# Patient Record
Sex: Male | Born: 1945 | Race: White | Hispanic: No | Marital: Married | Smoking: Current every day smoker
Health system: Southern US, Community
[De-identification: ages and names within clinical notes are randomized; demographics above are authoritative.]

## PROBLEM LIST (undated history)

## (undated) DIAGNOSIS — I5032 Chronic diastolic (congestive) heart failure: Secondary | ICD-10-CM

## (undated) DIAGNOSIS — J189 Pneumonia, unspecified organism: Secondary | ICD-10-CM

## (undated) DIAGNOSIS — K8020 Calculus of gallbladder without cholecystitis without obstruction: Secondary | ICD-10-CM

## (undated) DIAGNOSIS — I2510 Atherosclerotic heart disease of native coronary artery without angina pectoris: Secondary | ICD-10-CM

## (undated) DIAGNOSIS — G4700 Insomnia, unspecified: Secondary | ICD-10-CM

## (undated) DIAGNOSIS — F329 Major depressive disorder, single episode, unspecified: Secondary | ICD-10-CM

## (undated) DIAGNOSIS — K219 Gastro-esophageal reflux disease without esophagitis: Secondary | ICD-10-CM

## (undated) DIAGNOSIS — E7800 Pure hypercholesterolemia, unspecified: Secondary | ICD-10-CM

## (undated) DIAGNOSIS — R51 Headache: Secondary | ICD-10-CM

## (undated) DIAGNOSIS — E119 Type 2 diabetes mellitus without complications: Secondary | ICD-10-CM

## (undated) DIAGNOSIS — H409 Unspecified glaucoma: Secondary | ICD-10-CM

## (undated) DIAGNOSIS — J309 Allergic rhinitis, unspecified: Secondary | ICD-10-CM

## (undated) DIAGNOSIS — K222 Esophageal obstruction: Secondary | ICD-10-CM

## (undated) DIAGNOSIS — M479 Spondylosis, unspecified: Secondary | ICD-10-CM

## (undated) DIAGNOSIS — I219 Acute myocardial infarction, unspecified: Secondary | ICD-10-CM

## (undated) DIAGNOSIS — H269 Unspecified cataract: Secondary | ICD-10-CM

## (undated) DIAGNOSIS — A0472 Enterocolitis due to Clostridium difficile, not specified as recurrent: Secondary | ICD-10-CM

## (undated) DIAGNOSIS — R011 Cardiac murmur, unspecified: Secondary | ICD-10-CM

## (undated) DIAGNOSIS — I10 Essential (primary) hypertension: Secondary | ICD-10-CM

## (undated) DIAGNOSIS — R740 Nonspecific elevation of levels of transaminase and lactic acid dehydrogenase [LDH]: Secondary | ICD-10-CM

## (undated) DIAGNOSIS — K2980 Duodenitis without bleeding: Secondary | ICD-10-CM

## (undated) DIAGNOSIS — G709 Myoneural disorder, unspecified: Secondary | ICD-10-CM

## (undated) DIAGNOSIS — Z8719 Personal history of other diseases of the digestive system: Secondary | ICD-10-CM

## (undated) DIAGNOSIS — G43909 Migraine, unspecified, not intractable, without status migrainosus: Secondary | ICD-10-CM

## (undated) HISTORY — DX: Gastro-esophageal reflux disease without esophagitis: K21.9

## (undated) HISTORY — PX: ELBOW SURGERY: SHX618

## (undated) HISTORY — DX: Nonspecific elevation of levels of transaminase and lactic acid dehydrogenase (ldh): R74.0

## (undated) HISTORY — DX: Esophageal obstruction: K22.2

## (undated) HISTORY — DX: Allergic rhinitis, unspecified: J30.9

## (undated) HISTORY — PX: ESOPHAGOGASTRODUODENOSCOPY: SHX1529

## (undated) HISTORY — PX: EYE SURGERY: SHX253

## (undated) HISTORY — DX: Pure hypercholesterolemia, unspecified: E78.00

## (undated) HISTORY — DX: Major depressive disorder, single episode, unspecified: F32.9

## (undated) HISTORY — DX: Enterocolitis due to Clostridium difficile, not specified as recurrent: A04.72

## (undated) HISTORY — DX: Chronic diastolic (congestive) heart failure: I50.32

## (undated) HISTORY — PX: COLONOSCOPY: SHX174

## (undated) HISTORY — PX: OTHER SURGICAL HISTORY: SHX169

## (undated) HISTORY — DX: Type 2 diabetes mellitus without complications: E11.9

## (undated) HISTORY — DX: Unspecified glaucoma: H40.9

## (undated) HISTORY — DX: Atherosclerotic heart disease of native coronary artery without angina pectoris: I25.10

## (undated) HISTORY — DX: Spondylosis, unspecified: M47.9

## (undated) HISTORY — PX: CORONARY ANGIOPLASTY: SHX604

## (undated) HISTORY — DX: Insomnia, unspecified: G47.00

## (undated) HISTORY — DX: Myoneural disorder, unspecified: G70.9

## (undated) HISTORY — DX: Duodenitis without bleeding: K29.80

## (undated) HISTORY — DX: Unspecified cataract: H26.9

## (undated) HISTORY — DX: Essential (primary) hypertension: I10

---

## 1998-06-04 ENCOUNTER — Ambulatory Visit (HOSPITAL_COMMUNITY): Admission: RE | Admit: 1998-06-04 | Discharge: 1998-06-04 | Payer: Self-pay | Admitting: Urology

## 2002-05-04 ENCOUNTER — Encounter: Payer: Self-pay | Admitting: Emergency Medicine

## 2002-05-04 ENCOUNTER — Inpatient Hospital Stay (HOSPITAL_COMMUNITY): Admission: AC | Admit: 2002-05-04 | Discharge: 2002-05-09 | Payer: Self-pay

## 2002-05-05 ENCOUNTER — Encounter: Payer: Self-pay | Admitting: Surgery

## 2002-05-17 ENCOUNTER — Ambulatory Visit (HOSPITAL_COMMUNITY): Admission: RE | Admit: 2002-05-17 | Discharge: 2002-05-18 | Payer: Self-pay | Admitting: Orthopedic Surgery

## 2002-06-27 ENCOUNTER — Encounter: Admission: RE | Admit: 2002-06-27 | Discharge: 2002-06-27 | Payer: Self-pay | Admitting: Neurosurgery

## 2002-06-27 ENCOUNTER — Encounter: Payer: Self-pay | Admitting: Neurosurgery

## 2002-07-02 ENCOUNTER — Encounter: Payer: Self-pay | Admitting: Orthopedic Surgery

## 2002-07-02 ENCOUNTER — Inpatient Hospital Stay (HOSPITAL_COMMUNITY): Admission: AD | Admit: 2002-07-02 | Discharge: 2002-07-11 | Payer: Self-pay | Admitting: Orthopedic Surgery

## 2002-08-08 ENCOUNTER — Encounter: Admission: RE | Admit: 2002-08-08 | Discharge: 2002-08-08 | Payer: Self-pay | Admitting: Neurosurgery

## 2002-08-08 ENCOUNTER — Encounter: Payer: Self-pay | Admitting: Neurosurgery

## 2003-06-18 ENCOUNTER — Encounter: Admission: RE | Admit: 2003-06-18 | Discharge: 2003-06-18 | Payer: Self-pay | Admitting: Specialist

## 2003-08-23 DIAGNOSIS — I219 Acute myocardial infarction, unspecified: Secondary | ICD-10-CM

## 2003-08-23 HISTORY — PX: NECK SURGERY: SHX720

## 2003-08-23 HISTORY — DX: Acute myocardial infarction, unspecified: I21.9

## 2003-10-21 ENCOUNTER — Ambulatory Visit (HOSPITAL_COMMUNITY): Admission: RE | Admit: 2003-10-21 | Discharge: 2003-10-22 | Payer: Self-pay | Admitting: Specialist

## 2004-02-20 ENCOUNTER — Encounter (HOSPITAL_COMMUNITY): Admission: RE | Admit: 2004-02-20 | Discharge: 2004-05-20 | Payer: Self-pay | Admitting: Internal Medicine

## 2004-03-22 ENCOUNTER — Inpatient Hospital Stay (HOSPITAL_COMMUNITY): Admission: EM | Admit: 2004-03-22 | Discharge: 2004-03-24 | Payer: Self-pay | Admitting: Emergency Medicine

## 2004-03-23 ENCOUNTER — Encounter: Payer: Self-pay | Admitting: Cardiovascular Disease

## 2004-05-22 ENCOUNTER — Encounter (HOSPITAL_COMMUNITY): Admission: RE | Admit: 2004-05-22 | Discharge: 2004-08-20 | Payer: Self-pay | Admitting: Internal Medicine

## 2004-09-02 ENCOUNTER — Ambulatory Visit: Payer: Self-pay | Admitting: Internal Medicine

## 2004-10-18 ENCOUNTER — Ambulatory Visit: Payer: Self-pay | Admitting: *Deleted

## 2004-10-25 ENCOUNTER — Ambulatory Visit: Payer: Self-pay | Admitting: *Deleted

## 2004-10-29 ENCOUNTER — Ambulatory Visit: Payer: Self-pay | Admitting: Cardiology

## 2004-10-29 ENCOUNTER — Emergency Department (HOSPITAL_COMMUNITY): Admission: EM | Admit: 2004-10-29 | Discharge: 2004-10-29 | Payer: Self-pay | Admitting: Emergency Medicine

## 2004-11-01 ENCOUNTER — Ambulatory Visit: Payer: Self-pay | Admitting: *Deleted

## 2004-11-02 ENCOUNTER — Ambulatory Visit: Payer: Self-pay

## 2004-11-08 ENCOUNTER — Ambulatory Visit: Payer: Self-pay | Admitting: *Deleted

## 2004-11-11 ENCOUNTER — Ambulatory Visit: Payer: Self-pay | Admitting: Internal Medicine

## 2004-11-15 ENCOUNTER — Ambulatory Visit: Payer: Self-pay | Admitting: *Deleted

## 2004-11-22 ENCOUNTER — Ambulatory Visit: Payer: Self-pay | Admitting: *Deleted

## 2004-12-06 ENCOUNTER — Ambulatory Visit: Payer: Self-pay | Admitting: *Deleted

## 2005-02-17 ENCOUNTER — Ambulatory Visit: Payer: Self-pay | Admitting: Internal Medicine

## 2005-03-02 ENCOUNTER — Ambulatory Visit: Payer: Self-pay | Admitting: Internal Medicine

## 2005-03-28 ENCOUNTER — Ambulatory Visit: Payer: Self-pay | Admitting: Internal Medicine

## 2005-05-24 ENCOUNTER — Ambulatory Visit: Payer: Self-pay | Admitting: Gastroenterology

## 2005-06-06 ENCOUNTER — Ambulatory Visit: Payer: Self-pay | Admitting: Gastroenterology

## 2005-06-09 ENCOUNTER — Ambulatory Visit: Payer: Self-pay | Admitting: Gastroenterology

## 2005-06-09 ENCOUNTER — Encounter (INDEPENDENT_AMBULATORY_CARE_PROVIDER_SITE_OTHER): Payer: Self-pay | Admitting: Specialist

## 2005-06-09 LAB — HM COLONOSCOPY: HM Colonoscopy: NORMAL

## 2005-07-25 ENCOUNTER — Ambulatory Visit: Payer: Self-pay | Admitting: Gastroenterology

## 2005-08-10 ENCOUNTER — Ambulatory Visit: Payer: Self-pay | Admitting: Gastroenterology

## 2005-09-27 ENCOUNTER — Ambulatory Visit: Payer: Self-pay | Admitting: Gastroenterology

## 2005-09-30 ENCOUNTER — Ambulatory Visit: Payer: Self-pay | Admitting: Internal Medicine

## 2006-01-09 ENCOUNTER — Ambulatory Visit: Payer: Self-pay | Admitting: Internal Medicine

## 2006-03-01 ENCOUNTER — Ambulatory Visit: Payer: Self-pay | Admitting: Internal Medicine

## 2006-03-07 ENCOUNTER — Ambulatory Visit: Payer: Self-pay | Admitting: Internal Medicine

## 2006-04-27 ENCOUNTER — Ambulatory Visit: Payer: Self-pay | Admitting: Internal Medicine

## 2006-05-09 ENCOUNTER — Encounter: Payer: Self-pay | Admitting: Cardiology

## 2006-05-09 ENCOUNTER — Ambulatory Visit: Payer: Self-pay | Admitting: Internal Medicine

## 2006-05-09 ENCOUNTER — Ambulatory Visit: Payer: Self-pay

## 2006-05-22 ENCOUNTER — Ambulatory Visit: Payer: Self-pay | Admitting: Internal Medicine

## 2006-08-29 ENCOUNTER — Ambulatory Visit: Payer: Self-pay | Admitting: Internal Medicine

## 2006-10-31 ENCOUNTER — Ambulatory Visit: Payer: Self-pay | Admitting: Internal Medicine

## 2006-10-31 LAB — CONVERTED CEMR LAB
ALT: 49 units/L — ABNORMAL HIGH (ref 0–40)
AST: 52 units/L — ABNORMAL HIGH (ref 0–37)
Alkaline Phosphatase: 55 units/L (ref 39–117)
HDL: 36.1 mg/dL — ABNORMAL LOW (ref >39.0)
Total Bilirubin: 0.9 mg/dL (ref 0.3–1.2)
Total CHOL/HDL Ratio: 3.7
Total Protein: 6.4 g/dL (ref 6.0–8.3)

## 2007-02-26 ENCOUNTER — Ambulatory Visit: Payer: Self-pay | Admitting: Gastroenterology

## 2007-02-27 ENCOUNTER — Ambulatory Visit: Payer: Self-pay | Admitting: Internal Medicine

## 2007-03-20 ENCOUNTER — Ambulatory Visit: Payer: Self-pay | Admitting: Gastroenterology

## 2007-03-20 HISTORY — PX: ESOPHAGOGASTRODUODENOSCOPY: SHX1529

## 2007-04-05 ENCOUNTER — Ambulatory Visit: Payer: Self-pay | Admitting: Endocrinology

## 2007-05-02 ENCOUNTER — Encounter: Payer: Self-pay | Admitting: Endocrinology

## 2007-05-02 DIAGNOSIS — E119 Type 2 diabetes mellitus without complications: Secondary | ICD-10-CM

## 2007-05-02 DIAGNOSIS — I10 Essential (primary) hypertension: Secondary | ICD-10-CM

## 2007-05-02 DIAGNOSIS — I2510 Atherosclerotic heart disease of native coronary artery without angina pectoris: Secondary | ICD-10-CM | POA: Insufficient documentation

## 2007-05-02 DIAGNOSIS — J309 Allergic rhinitis, unspecified: Secondary | ICD-10-CM

## 2007-05-02 HISTORY — DX: Type 2 diabetes mellitus without complications: E11.9

## 2007-05-02 HISTORY — DX: Essential (primary) hypertension: I10

## 2007-05-02 HISTORY — DX: Allergic rhinitis, unspecified: J30.9

## 2007-05-03 ENCOUNTER — Ambulatory Visit: Payer: Self-pay | Admitting: Endocrinology

## 2007-06-19 ENCOUNTER — Telehealth (INDEPENDENT_AMBULATORY_CARE_PROVIDER_SITE_OTHER): Payer: Self-pay | Admitting: *Deleted

## 2007-11-15 ENCOUNTER — Ambulatory Visit: Payer: Self-pay | Admitting: Internal Medicine

## 2007-12-10 DIAGNOSIS — K222 Esophageal obstruction: Secondary | ICD-10-CM

## 2007-12-10 DIAGNOSIS — K219 Gastro-esophageal reflux disease without esophagitis: Secondary | ICD-10-CM

## 2007-12-10 DIAGNOSIS — R7401 Elevation of levels of liver transaminase levels: Secondary | ICD-10-CM | POA: Insufficient documentation

## 2007-12-10 DIAGNOSIS — K2980 Duodenitis without bleeding: Secondary | ICD-10-CM | POA: Insufficient documentation

## 2007-12-10 DIAGNOSIS — R740 Nonspecific elevation of levels of transaminase and lactic acid dehydrogenase [LDH]: Secondary | ICD-10-CM

## 2007-12-10 HISTORY — DX: Elevation of levels of liver transaminase levels: R74.01

## 2007-12-10 HISTORY — DX: Duodenitis without bleeding: K29.80

## 2007-12-10 HISTORY — DX: Esophageal obstruction: K22.2

## 2007-12-10 HISTORY — DX: Gastro-esophageal reflux disease without esophagitis: K21.9

## 2007-12-12 ENCOUNTER — Ambulatory Visit: Payer: Self-pay | Admitting: Gastroenterology

## 2007-12-12 LAB — CONVERTED CEMR LAB
Albumin: 4 g/dL (ref 3.5–5.2)
Basophils Absolute: 0 10*3/uL (ref 0.0–0.1)
Calcium: 9.3 mg/dL (ref 8.4–10.5)
Eosinophils Absolute: 0.1 10*3/uL (ref 0.0–0.7)
GFR calc Af Amer: 111 mL/min
GFR calc non Af Amer: 92 mL/min
Glucose, Bld: 116 mg/dL — ABNORMAL HIGH (ref 70–99)
HCT: 45.1 % (ref 39.0–52.0)
Hemoglobin: 15.2 g/dL (ref 13.0–17.0)
Monocytes Absolute: 0.5 10*3/uL (ref 0.1–1.0)
Neutrophils Relative %: 66.3 % (ref 43.0–77.0)
Platelets: 258 10*3/uL (ref 150–400)
Potassium: 4 meq/L (ref 3.5–5.1)
RBC: 4.39 M/uL (ref 4.22–5.81)
Total Protein: 7.1 g/dL (ref 6.0–8.3)

## 2007-12-18 ENCOUNTER — Encounter: Payer: Self-pay | Admitting: Gastroenterology

## 2007-12-18 ENCOUNTER — Telehealth: Payer: Self-pay | Admitting: Gastroenterology

## 2007-12-24 ENCOUNTER — Encounter: Payer: Self-pay | Admitting: Gastroenterology

## 2007-12-24 ENCOUNTER — Ambulatory Visit: Payer: Self-pay | Admitting: Gastroenterology

## 2007-12-26 ENCOUNTER — Encounter: Payer: Self-pay | Admitting: Gastroenterology

## 2008-03-25 ENCOUNTER — Telehealth: Payer: Self-pay | Admitting: Gastroenterology

## 2008-06-04 ENCOUNTER — Ambulatory Visit: Payer: Self-pay | Admitting: Cardiovascular Disease

## 2008-06-04 ENCOUNTER — Observation Stay (HOSPITAL_COMMUNITY): Admission: EM | Admit: 2008-06-04 | Discharge: 2008-06-06 | Payer: Self-pay | Admitting: *Deleted

## 2008-07-08 ENCOUNTER — Telehealth: Payer: Self-pay | Admitting: Endocrinology

## 2008-08-13 ENCOUNTER — Telehealth: Payer: Self-pay | Admitting: Endocrinology

## 2008-08-18 ENCOUNTER — Telehealth: Payer: Self-pay | Admitting: Internal Medicine

## 2008-08-28 ENCOUNTER — Ambulatory Visit: Payer: Self-pay | Admitting: Endocrinology

## 2008-08-28 DIAGNOSIS — R209 Unspecified disturbances of skin sensation: Secondary | ICD-10-CM | POA: Insufficient documentation

## 2008-08-28 LAB — CONVERTED CEMR LAB
BUN: 8 mg/dL (ref 6–23)
Calcium: 9.5 mg/dL (ref 8.4–10.5)
Chloride: 99 meq/L (ref 96–112)
Folate: 9.9 ng/mL
GFR calc Af Amer: 111 mL/min
GFR calc non Af Amer: 91 mL/min
Glucose, Bld: 126 mg/dL — ABNORMAL HIGH (ref 70–99)
Hgb A1c MFr Bld: 6.4 % — ABNORMAL HIGH (ref 4.6–6.0)
Sodium: 138 meq/L (ref 135–145)
Vitamin B-12: 452 pg/mL (ref 211–911)

## 2008-10-02 ENCOUNTER — Encounter: Payer: Self-pay | Admitting: Endocrinology

## 2009-08-07 ENCOUNTER — Encounter: Payer: Self-pay | Admitting: Internal Medicine

## 2009-08-22 HISTORY — PX: BILATERAL VATS ABLATION: SHX1224

## 2009-08-26 ENCOUNTER — Telehealth: Payer: Self-pay | Admitting: Endocrinology

## 2009-10-27 ENCOUNTER — Ambulatory Visit: Payer: Self-pay | Admitting: Endocrinology

## 2009-10-27 DIAGNOSIS — F329 Major depressive disorder, single episode, unspecified: Secondary | ICD-10-CM

## 2009-10-27 DIAGNOSIS — F17200 Nicotine dependence, unspecified, uncomplicated: Secondary | ICD-10-CM

## 2009-10-27 DIAGNOSIS — F3289 Other specified depressive episodes: Secondary | ICD-10-CM

## 2009-10-27 DIAGNOSIS — M479 Spondylosis, unspecified: Secondary | ICD-10-CM

## 2009-10-27 DIAGNOSIS — E7800 Pure hypercholesterolemia, unspecified: Secondary | ICD-10-CM

## 2009-10-27 DIAGNOSIS — R05 Cough: Secondary | ICD-10-CM

## 2009-10-27 HISTORY — DX: Pure hypercholesterolemia, unspecified: E78.00

## 2009-10-27 HISTORY — DX: Other specified depressive episodes: F32.89

## 2009-10-27 HISTORY — DX: Spondylosis, unspecified: M47.9

## 2009-10-27 HISTORY — DX: Major depressive disorder, single episode, unspecified: F32.9

## 2010-01-08 ENCOUNTER — Ambulatory Visit: Payer: Self-pay | Admitting: Internal Medicine

## 2010-02-23 ENCOUNTER — Telehealth: Payer: Self-pay | Admitting: Endocrinology

## 2010-03-17 ENCOUNTER — Telehealth: Payer: Self-pay | Admitting: Endocrinology

## 2010-03-28 ENCOUNTER — Inpatient Hospital Stay (HOSPITAL_COMMUNITY)
Admission: EM | Admit: 2010-03-28 | Discharge: 2010-04-06 | Payer: Self-pay | Source: Home / Self Care | Admitting: Emergency Medicine

## 2010-03-28 ENCOUNTER — Ambulatory Visit: Payer: Self-pay | Admitting: Cardiology

## 2010-03-29 ENCOUNTER — Encounter (INDEPENDENT_AMBULATORY_CARE_PROVIDER_SITE_OTHER): Payer: Self-pay | Admitting: Internal Medicine

## 2010-03-30 ENCOUNTER — Encounter: Payer: Self-pay | Admitting: Internal Medicine

## 2010-03-30 ENCOUNTER — Ambulatory Visit: Payer: Self-pay | Admitting: Cardiothoracic Surgery

## 2010-03-31 ENCOUNTER — Encounter (INDEPENDENT_AMBULATORY_CARE_PROVIDER_SITE_OTHER): Payer: Self-pay | Admitting: Internal Medicine

## 2010-04-13 ENCOUNTER — Ambulatory Visit: Payer: Self-pay | Admitting: Cardiothoracic Surgery

## 2010-04-27 ENCOUNTER — Ambulatory Visit: Payer: Self-pay | Admitting: Cardiothoracic Surgery

## 2010-04-27 ENCOUNTER — Ambulatory Visit: Payer: Self-pay | Admitting: Endocrinology

## 2010-04-27 ENCOUNTER — Encounter: Admission: RE | Admit: 2010-04-27 | Discharge: 2010-04-27 | Payer: Self-pay | Admitting: Cardiothoracic Surgery

## 2010-05-27 ENCOUNTER — Encounter: Admission: RE | Admit: 2010-05-27 | Discharge: 2010-05-27 | Payer: Self-pay | Admitting: Cardiothoracic Surgery

## 2010-05-27 ENCOUNTER — Encounter: Payer: Self-pay | Admitting: Internal Medicine

## 2010-05-27 ENCOUNTER — Ambulatory Visit: Payer: Self-pay | Admitting: Cardiothoracic Surgery

## 2010-05-30 ENCOUNTER — Encounter: Admission: RE | Admit: 2010-05-30 | Discharge: 2010-05-30 | Payer: Self-pay | Admitting: Anesthesiology

## 2010-06-03 ENCOUNTER — Ambulatory Visit: Payer: Self-pay | Admitting: Internal Medicine

## 2010-07-13 ENCOUNTER — Encounter: Admission: RE | Admit: 2010-07-13 | Payer: Self-pay | Admitting: Anesthesiology

## 2010-07-21 ENCOUNTER — Encounter: Admission: RE | Admit: 2010-07-21 | Payer: Self-pay | Source: Home / Self Care | Admitting: Specialist

## 2010-08-04 ENCOUNTER — Telehealth: Payer: Self-pay | Admitting: Internal Medicine

## 2010-09-08 ENCOUNTER — Encounter
Admission: RE | Admit: 2010-09-08 | Discharge: 2010-09-08 | Payer: Self-pay | Source: Home / Self Care | Attending: Anesthesiology | Admitting: Anesthesiology

## 2010-09-11 ENCOUNTER — Encounter: Payer: Self-pay | Admitting: Specialist

## 2010-09-12 ENCOUNTER — Encounter: Payer: Self-pay | Admitting: Gastroenterology

## 2010-09-19 LAB — CONVERTED CEMR LAB
ALT: 41 units/L (ref 0–53)
AST: 19 units/L (ref 0–37)
AST: 50 units/L — ABNORMAL HIGH (ref 0–37)
Albumin: 3.7 g/dL (ref 3.5–5.2)
Albumin: 4 g/dL (ref 3.5–5.2)
Alkaline Phosphatase: 66 units/L (ref 39–117)
Basophils Absolute: 0 10*3/uL (ref 0.0–0.1)
Basophils Absolute: 0.1 10*3/uL (ref 0.0–0.1)
Basophils Relative: 0.5 % (ref 0.0–3.0)
Basophils Relative: 1.1 % (ref 0.0–3.0)
Calcium: 8.8 mg/dL (ref 8.4–10.5)
Calcium: 9.1 mg/dL (ref 8.4–10.5)
Creatinine,U: 36.7 mg/dL
Eosinophils Absolute: 0.1 10*3/uL (ref 0.0–0.7)
Eosinophils Absolute: 0.1 10*3/uL (ref 0.0–0.7)
Eosinophils Relative: 1.1 % (ref 0.0–5.0)
Eosinophils Relative: 1.9 % (ref 0.0–5.0)
GFR calc non Af Amer: 104.24 mL/min (ref >60)
GFR calc non Af Amer: 142.26 mL/min (ref >60)
Glucose, Bld: 102 mg/dL — ABNORMAL HIGH (ref 70–99)
HCT: 41.1 % (ref 39.0–52.0)
HDL: 53.3 mg/dL (ref >39.00)
Hemoglobin: 14 g/dL (ref 13.0–17.0)
Lymphocytes Relative: 20.2 % (ref 12.0–46.0)
Lymphs Abs: 1.4 10*3/uL (ref 0.7–4.0)
MCHC: 34.1 g/dL (ref 30.0–36.0)
MCV: 102.8 fL — ABNORMAL HIGH (ref 78.0–100.0)
Microalb, Ur: 0.9 mg/dL (ref 0.0–1.9)
Monocytes Relative: 5.8 % (ref 3.0–12.0)
Monocytes Relative: 5.8 % (ref 3.0–12.0)
Neutro Abs: 5.1 10*3/uL (ref 1.4–7.7)
Neutrophils Relative %: 71 % (ref 43.0–77.0)
Nitrite: NEGATIVE
Platelets: 248 10*3/uL (ref 150.0–400.0)
Potassium: 3.9 meq/L (ref 3.5–5.1)
Potassium: 4 meq/L (ref 3.5–5.1)
RDW: 12.1 % (ref 11.5–14.6)
Specific Gravity, Urine: 1.01 (ref 1.000–1.030)
TSH: 1.9 microintl units/mL (ref 0.35–5.50)
Total Bilirubin: 0.4 mg/dL (ref 0.3–1.2)
Total Bilirubin: 0.4 mg/dL (ref 0.3–1.2)
Total Protein: 6.5 g/dL (ref 6.0–8.3)
Total Protein: 7.5 g/dL (ref 6.0–8.3)
WBC: 6.4 10*3/uL (ref 4.5–10.5)
WBC: 7.1 10*3/uL (ref 4.5–10.5)
pH: 6.5 (ref 5.0–8.0)

## 2010-09-21 NOTE — Assessment & Plan Note (Signed)
Summary: YEARLY FU/ NWS  #   Vital Signs:  Patient profile:   65 year old male Height:      69 inches Weight:      178 pounds BMI:     26.38 O2 Sat:      95 % on Room air Temp:     94.4 degrees F oral Pulse rate:   80 / minute BP sitting:   112 / 68  (left arm) Cuff size:   regular  Vitals Entered By: Josph Macho RMA (October 27, 2009 2:22 PM)  O2 Flow:  Room air CC: pt here for cpx/pt states he stopped taking zyrtec, celebrex, pantoprazole Is Patient Diabetic? Yes   CC:  pt here for cpx/pt states he stopped taking zyrtec, celebrex, and pantoprazole.  History of Present Illness: here for regular wellness examination.  He's feeling pretty well in general, and does not drink etoh.   Current Medications (verified): 1)  Oxycontin 20 Mg  Tb12 (Oxycodone Hcl) .... Take As Diected 2)  Oxycodone-Acetaminophen 5-325 Mg  Tabs (Oxycodone-Acetaminophen) .... Take 1 By Mouth Qd 3)  Robaxin-750 750 Mg  Tabs (Methocarbamol) .... Take 1 By Mouth Once Daily Prn 4)  Mysoline 50 Mg  Tabs (Primidone) .... Take 1 By Mouth Qhs 5)  Adult Aspirin Low Strength 81 Mg  Tbdp (Aspirin) .... Take 1 By Mouth Qd 6)  Plavix 75 Mg  Tabs (Clopidogrel Bisulfate) .... Take 1 By Mouth Qd 7)  Lisinopril 20 Mg  Tabs (Lisinopril) .... Take 1 By Mouth Qd 8)  Toprol Xl 50 Mg  Tb24 (Metoprolol Succinate) .... Take 1/2 Tab By Mouth Qd 9)  Maxzide-25 37.5-25 Mg  Tabs (Triamterene-Hctz) .... Take 1 By Mouth Qd 10)  Zyrtec Allergy 10 Mg  Tabs (Cetirizine Hcl) .... Take 1 By Mouth Qd 11)  Xalatan 0.005 %  Soln (Latanoprost) .... Use 1 Drop Two Times A Day in Both Eyes Qd 12)  Optivar 0.05 %  Soln (Azelastine Hcl) .... Use 1 Drop Two Times A Day in Both Eyes Once Daily 13)  Pantoprazole Sodium 40 Mg  Tbec (Pantoprazole Sodium) .Marland Kitchen.. 1 Twice A Day 30 Minutes Before Meals 14)  Cymbalta 60 Mg  Cpep (Duloxetine Hcl) .... Take 1 By Mouth Two Times A Day Qd 15)  Lunesta 3 Mg  Tabs (Eszopiclone) .... Take 1 By Mouth Qhs 16)   Zocor 40 Mg  Tabs (Simvastatin) .... Take 1 By Mouth Qd 17)  Celebrex 200 Mg  Caps (Celecoxib) .... Take 1 By Mouth Qd 18)  Klor-Con M20 20 Meq  Tbcr (Potassium Chloride Crys Cr) .... Take 1 By Mouth Qd 19)  Nitrostat 0.4 Mg  Subl (Nitroglycerin) .... Use Prn 20)  Triamterene-Hctz 37.5-25 Mg Tabs (Triamterene-Hctz) .... Take 1 Tablet By Mouth Once A Day 21)  Metformin Hcl 500 Mg Xr24h-Tab (Metformin Hcl) .... 2 Qam  Allergies (verified): 1)  ! Verapamil 2)  ! Wellbutrin 3)  ! Tetracycline 4)  Doxycycline Hyclate (Doxycycline Hyclate) 5)  Verelan Pm (Verapamil Hcl)  Family History: Reviewed history and no changes required. mother had uncertain type of cancer (deceased)  Social History: Reviewed history and no changes required. disabled married  Review of Systems  The patient denies weight loss, weight gain, vision loss, decreased hearing, syncope, headaches, abdominal pain, melena, hematochezia, severe indigestion/heartburn, hematuria, and suspicious skin lesions.         he seldom has angina.  he has slight depression.  Physical Exam  General:  normal appearance.  Head:  head: no deformity eyes: no periorbital swelling, no proptosis external nose and ears are normal mouth: no lesion seen Neck:  Supple without thyroid enlargement or tenderness.  Heart:  Regular rate and rhythm without murmurs or gallops noted. Normal S1,S2.   Abdomen:  abdomen is soft, nontender.  no plenomegaly.   not distended.  there is a self-reducing  midline hernia  Rectal:  normal external and internal exam.  heme neg  Prostate:  Normal size prostate without masses or tenderness.  Msk:  muscle bulk and strength are grossly normal.  no obvious joint swelling.  gait is normal and steady there is a healed midline surgical scar at the posterior neck. 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  there is chronic red discoloration on the legs, but no tenderness or warmth.  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 Neurologic:  cn 2-12 grossly intact.   readily moves all 4's.   sensation is intact to touch on the feet, but severely decreased from normal. Skin:  there is slight skin breakdown at the buttocks Cervical Nodes:  No significant adenopathy.  Psych:  Alert and cooperative; normal mood and affect; normal attention span and concentration.   Additional Exam:  SEPARATE EVALUATION FOLLOWS--EACH PROBLEM HERE IS NEW, NOT RESPONDING TO TREATMENT, OR POSES SIGNIFICANT RISK TO THE PATIENT'S HEALTH: HISTORY OF THE PRESENT ILLNESS: dyslipidemia is noted on labs.  he tolerates zocor well. pt states slight dry cough. pt thinks he burned himself on the back, x 1 year.  he states moderate rash there. no associated itching PAST MEDICAL HISTORY reviewed and up to date today REVIEW OF SYSTEMS: denies sob and fever PHYSICAL EXAMINATION: clear to auscultation, except for rales at the left base.  no respiratory distress dorsalis pedis intact bilat, but decreased from normal.  no carotid bruit there is a moderate partial-thickness burn on the lower back. LAB/XRAY RESULTS: ldl=119 IMPRESSION: partial-thickness burn dyslipidemia, needs increased rx cough, prob due to recent uri PLAN: see instruction sheet   Impression & Recommendations:  Problem # 1:  ROUTINE GENERAL MEDICAL EXAM@HEALTH  CARE FACL (ICD-V70.0)  Medications Added to Medication List This Visit: 1)  Oxycontin 60 Mg Tb12 (oxycodone Hcl)  .... Take as diected 2)  Oxycodone-acetaminophen10-325 Mg Tabs  .... Take 1 by mouth once daily dr Vear Clock  Other Orders: EKG w/ Interpretation (93000) Pneumococcal Vaccine (28413) Admin 1st Vaccine (24401) Flu Vaccine 50yrs + (02725) T-2 View CXR (71020TC) TLB-Lipid Panel (80061-LIPID) TLB-BMP (Basic Metabolic Panel-BMET) (80048-METABOL) TLB-CBC Platelet - w/Differential (85025-CBCD) TLB-Hepatic/Liver Function Pnl (80076-HEPATIC) TLB-TSH (Thyroid Stimulating Hormone)  (84443-TSH) TLB-A1C / Hgb A1C (Glycohemoglobin) (83036-A1C) TLB-Microalbumin/Creat Ratio, Urine (82043-MALB) TLB-Udip w/ Micro (81001-URINE) TLB-PSA (Prostate Specific Antigen) (84153-PSA) Prescription Created Electronically (D6644) Est. Patient Level IV (03474) Est. Patient 40-64 years (25956)  Preventive Care Screening  Colonoscopy:    Date:  06/09/2005    Results:  normal    Patient Instructions: 1)  chest x ray and blood tests today. 2)  tests are being ordered for you today.  a few days after the test(s), please call [phone] to hear your test results. 3)  return 6 months 4)  (update: i left message on phone-tree:  if you are not taking zocor, please start.  if so, call us so we can change to more effective med). Prescriptions: METFORMIN HCL 500 MG XR24H-TAB (METFORMIN HCL) 2 qam  #60 x 11   Entered and Authorized by:   Minus Breeding MD   Signed by:   Minus Breeding MD  on 10/27/2009   Method used:   [id number]       [address]       [place], [place]  [address]       Ph: [phone] or [phone]       Fax: [phone]   RxID:   [id number]    Immunizations Administered:  Pneumonia Vaccine:    Vaccine Type: Pneumovax    Site: right deltoid    Mfr: Merck    Dose: 0.5 ml    Route: IM    Given by: Josph Macho RMA    Exp. Date: 04/05/2011    Lot #: [id number]    VIS given: 03/19/96 version given October 27, 2009. Flu Vaccine Consent Questions     Do you have a history of severe allergic reactions to this vaccine? no    Any prior history of allergic reactions to egg and/or gelatin? no    Do you have a sensitivity to the preservative Thimersol? no    Do you have a past history of Guillan-Barre Syndrome? no    Do you currently have an acute febrile illness? no    Have you ever had a severe reaction to latex? no    Vaccine information given and explained to patient? yes    Are you currently  pregnant? no    Lot Number:[id number]   Exp Date:02/18/2010   Site Given  Left Deltoid IM    Influenza Vaccine # 1 not given due to: declined PT ACCEPTED FLU VACCINE/cf .lbflu   Preventive Care Screening  Colonoscopy:    Date:  06/09/2005    Results:  normal

## 2010-09-21 NOTE — Progress Notes (Signed)
Summary: postassium  Phone Note Refill Request Message from:  Fax from Pharmacy  Refills Requested: Medication #1:  KLOR-CON M20 20 MEQ  TBCR take 1 by mouth qd   Dosage confirmed as above?Dosage Confirmed  Method Requested: Fax to Local Pharmacy Initial call taken by: Brenton Grills MA,  March 17, 2010 3:13 PM    Prescriptions: KLOR-CON M20 20 MEQ  TBCR (POTASSIUM CHLORIDE CRYS CR) take 1 by mouth qd  #1 month x 2   Entered by:   Brenton Grills MA   Authorized by:   Minus Breeding MD   Signed by:   Brenton Grills MA on 03/17/2010   Method used:   Faxed to ...       [facility]* (retail)       [address] [place], [place]  [address]       Ph: [phone] or [phone]       Fax: [phone]   RxID:   [id number]

## 2010-09-21 NOTE — Progress Notes (Signed)
Summary: Rx refill req  Phone Note Refill Request Message from:  Fax from Pharmacy on February 23, 2010 8:13 AM  Refills Requested: Medication #1:  METFORMIN HCL 500 MG XR24H-TAB 2 qam   Dosage confirmed as above?Dosage Confirmed   Supply Requested: 3 months  Method Requested: Electronic Initial call taken by: Margaret Pyle, CMA,  February 23, 2010 8:14 AM    Prescriptions: METFORMIN HCL 500 MG XR24H-TAB (METFORMIN HCL) 2 qam  #180 x 2   Entered by:   Margaret Pyle, CMA   Authorized by:   Minus Breeding MD   Signed by:   Margaret Pyle, CMA on 02/23/2010   Method used:   [facility]        [id number]       [address]       [place], [place]  [address]       Ph: [phone] or [phone]       Fax: [phone]   RxID:   [id number]

## 2010-09-21 NOTE — Progress Notes (Signed)
  Phone Note Refill Request Message from:  Fax from Pharmacy on August 26, 2009 12:01 PM  Refills Requested: Medication #1:  KLOR-CON M20 20 MEQ  TBCR take 1 by mouth qd   Dosage confirmed as above?Dosage Confirmed Initial call taken by: Josph Macho CMA,  August 26, 2009 12:02 PM    Prescriptions: KLOR-CON M20 20 MEQ  TBCR (POTASSIUM CHLORIDE CRYS CR) take 1 by mouth qd  #1 month x 1   Entered by:   Josph Macho CMA   Authorized by:   Minus Breeding MD   Signed by:   Josph Macho CMA on 08/26/2009   Method used:   Electronically to        [facility]* (retail)       [address] [place], [place]  [address]       Ph: [phone] or [phone]       Fax: [phone]   RxID:   [id number]

## 2010-09-21 NOTE — Assessment & Plan Note (Signed)
Summary: f1y per pt call/lg  Medications Added MYSOLINE 50 MG  TABS (PRIMIDONE) take 3 by mouth qhs ZITHROMAX 1 GM PACK (AZITHROMYCIN) take as directed ZOCOR 80 MG TABS (SIMVASTATIN) 1 tab every day      Allergies Added:   Visit Type:  Follow-up Primary Provider:  Dr Marylin Crosby  CC:  since last visit pt has had 3 eposides of angina.  History of Present Illness:  IDENTIFICATION:  [name] is a 65 year old gentleman with a history of CAD (status post PTCA/stent to the circumflex and LAD in 2005, drug- eluting).  I last saw him back in July of last year.  Since then he has had rare episodes of chest pain.  The last one was 3 wks ago, relieved with 1 SL NTG.  Breathing is ok.  Notes some sinus issues recently, thinks he has an infection.  Sputum is yellow/green.  Current Medications (verified): 1)  Oxycontin 60 Mg  Tb12 (Oxycodone Hcl) .... Take As Diected 2)  Oxycodone-Acetaminophen10-325 Mg  Tabs .... Take 1 By Mouth Once Daily Dr Vear Clock 3)  Robaxin-750 750 Mg  Tabs (Methocarbamol) .... Take 1 By Mouth Once Daily Prn 4)  Mysoline 50 Mg  Tabs (Primidone) .... Take 3 By Mouth Qhs 5)  Adult Aspirin Low Strength 81 Mg  Tbdp (Aspirin) .... Take 1 By Mouth Qd 6)  Plavix 75 Mg  Tabs (Clopidogrel Bisulfate) .... Take 1 By Mouth Qd 7)  Lisinopril 20 Mg  Tabs (Lisinopril) .... Take 1 By Mouth Qd 8)  Toprol Xl 50 Mg  Tb24 (Metoprolol Succinate) .... Take 1/2 Tab By Mouth Qd 9)  Maxzide-25 37.5-25 Mg  Tabs (Triamterene-Hctz) .... Take 1 By Mouth Qd 10)  Xalatan 0.005 %  Soln (Latanoprost) .... Use 1 Drop Two Times A Day in Both Eyes Qd 11)  Optivar 0.05 %  Soln (Azelastine Hcl) .... Use 1 Drop Two Times A Day in Both Eyes Once Daily 12)  Cymbalta 60 Mg  Cpep (Duloxetine Hcl) .... Take 1 By Mouth Two Times A Day Qd 13)  Lunesta 3 Mg  Tabs (Eszopiclone) .... Take 1 By Mouth Qhs 14)  Zocor 40 Mg  Tabs (Simvastatin) .... Take 1 By Mouth Qd 15)  Klor-Con M20 20 Meq  Tbcr (Potassium  Chloride Crys Cr) .... Take 1 By Mouth Qd 16)  Nitrostat 0.4 Mg  Subl (Nitroglycerin) .... Use Prn 17)  Triamterene-Hctz 37.5-25 Mg Tabs (Triamterene-Hctz) .... Take 1 Tablet By Mouth Once A Day 18)  Metformin Hcl 500 Mg Xr24h-Tab (Metformin Hcl) .... 2 Qam  Allergies (verified): 1)  ! Verapamil 2)  ! Wellbutrin 3)  ! Tetracycline 4)  Doxycycline Hyclate (Doxycycline Hyclate) 5)  Verelan Pm (Verapamil Hcl)  Past History:  Past medical, surgical, family and social histories (including risk factors) reviewed, and no changes noted (except as noted below).  Past Medical History: Reviewed history from 08/28/2008 and no changes required. Insomnia Dyslipidemia DUODENITIS (ICD-535.60) ESOPHAGEAL STRICTURE (ICD-530.3) TRANSAMINASES, SERUM, ELEVATED (ICD-790.4) GERD (ICD-530.81) DIABETES MELLITUS, TYPE II (ICD-250.00) HYPERTENSION (ICD-401.9) CORONARY ARTERY DISEASE (ICD-414.00) ALLERGIC RHINITIS (ICD-477.9)  Past Surgical History: Reviewed history from 05/02/2007 and no changes required. EGD (03/20/2007)  Family History: Reviewed history from 10/27/2009 and no changes required. mother had uncertain type of cancer (deceased)  Social History: Reviewed history from 10/27/2009 and no changes required. disabled married  Review of Systems       ALl systems reviewed.  Negative to the above problem  Vital Signs:  Patient profile:  65 year old male Height:      69 inches Weight:      169 pounds BMI:     25.05 Pulse rate:   69 / minute BP sitting:   148 / 83  (left arm) Cuff size:   regular  Vitals Entered By: Timothy Kanaris, CNA (Jan 08, 2010 2:07 PM)  Physical Exam  Additional Exam:  Patient is in NAD. HEENT:  Normocephalic, atraumatic. EOMI, PERRLA.  Neck: JVP is normal. No thyromegaly. No bruits.  Lungs: clear to auscultation. No rales no wheezes.  Heart: Regular rate and rhythm. Normal S1, S2. No S3.   No significant murmurs. PMI not displaced.  Abdomen:  Supple,  nontender. Normal bowel sounds. No masses. No hepatomegaly.  Extremities:   Good distal pulses throughout. No lower extremity edema.  Musculoskeletal :moving all extremities.  Neuro:   alert and oriented x3.    EKG  Procedure date:  01/08/2010  Findings:      NSR.  63 bpm.  RBBB.  Impression & Recommendations:  Problem # 1:  CORONARY ARTERY DISEASE (ICD-414.00) Doing well.  Keep on same regimen.  Problem # 2:  HYPERCHOLESTEROLEMIA (ICD-272.0) I have asked him to increase his SZocor to 80 as LDL wsa 119 in March.  F/u lipid panel in 8 wks. The following medications were removed from the medication list:    Zocor 40 Mg Tabs (Simvastatin) .Marland Kitchen... Take 1 by mouth qd His updated medication list for this problem includes:    Zocor 80 Mg Tabs (Simvastatin) .Marland Kitchen... 1 tab every day  Problem # 3:  HYPERTENSION (ICD-401.9) Will need to be followed.  Problem # 4:  COUGH (ICD-786.2) Does not complain of a cough.  COmplains more of sinus drainage.  I have prescribed a Z pac.  Other Orders: EKG w/ Interpretation (93000)  Patient Instructions: 1)  Your physician recommends that you return for a FASTING lipid profile: lipid and ast in 8 weeks  2)  Your physician wants you to follow-up in: Feb 2012  You will receive a reminder letter in the mail two months in advance. If you don't receive a letter, please call our office to schedule the follow-up appointment. Prescriptions: ZOCOR 80 MG TABS (SIMVASTATIN) 1 tab every day  #30 x 11   Entered by:   Layne Benton, RN, BSN   Authorized by:   Sherrill Raring, MD, [facility]   Signed by:   Layne Benton, RN, BSN on 01/08/2010   Method used:   [id number]       [address]       [place], [place]  [address]       Ph: [phone] or [phone]       Fax: [phone]   RxID:   [id number] ZITHROMAX 1 GM PACK (AZITHROMYCIN) take as directed  #1 pack x 0   Entered by:    Layne Benton, RN, BSN   Authorized by:   Sherrill Raring, MD, [facility]   Signed by:   Layne Benton, RN, BSN on 01/08/2010   Method used:   [facility] [id number]       [address]       [place], [place]  [address]       Ph: [phone] or [phone]  Fax: [phone]   RxID:   [id number]

## 2010-09-21 NOTE — Letter (Signed)
Summary: [facility] Office Visit   [facility] Office Visit   Imported By: Roderic Ovens 06/08/2010 11:17:20  _____________________________________________________________________  External Attachment:    Type:   Image     Comment:   External Document

## 2010-09-21 NOTE — Assessment & Plan Note (Signed)
Summary: eph/per spouse call/lg  Medications Added * OXYCONTIN 60 MG  TB12 (OXYCODONE HCL) every 4 hours as needed      Allergies Added:   Visit Type:  Follow-up Primary Provider:  Dr Marylin Crosby   History of Present Illness: IDENTIFICATION:  [name] is a 65 year old gentleman with a history of CAD (status post PTCA/stent to the circumflex and LAD in 2005, drug- eluting).  I last saw him back in May. when I saw him he had some sinus congestion.  He  took by mouth abx.  It never cleared and got worse. He was admitted in August with an empyema.  During surg drainage he had a short burst of SVT.  None after. Since d/c he has been recuperating.  He said his breathing is near baseline.  He denies chest pain  Current Medications (verified): 1)  Oxycontin 60 Mg  Tb12 (Oxycodone Hcl) .... Every 4 Hours As Needed 2)  Oxycodone-Acetaminophen10-325 Mg  Tabs .... Take 1 By Mouth Once Daily Dr Vear Clock 3)  Robaxin-750 750 Mg  Tabs (Methocarbamol) .... Take 1 By Mouth Once Daily Prn 4)  Mysoline 50 Mg  Tabs (Primidone) .... Take 3 By Mouth Qhs 5)  Adult Aspirin Low Strength 81 Mg  Tbdp (Aspirin) .... Take 1 By Mouth Qd 6)  Plavix 75 Mg  Tabs (Clopidogrel Bisulfate) .... Take 1 By Mouth Qd 7)  Lisinopril 20 Mg  Tabs (Lisinopril) .... Take 1 By Mouth Qd 8)  Toprol Xl 50 Mg  Tb24 (Metoprolol Succinate) .... Take 1/2 Tab By Mouth Qd 9)  Maxzide-25 37.5-25 Mg  Tabs (Triamterene-Hctz) .... Take 1 By Mouth Qd 10)  Xalatan 0.005 %  Soln (Latanoprost) .... Use 1 Drop Two Times A Day in Both Eyes Qd 11)  Optivar 0.05 %  Soln (Azelastine Hcl) .... Use 1 Drop Two Times A Day in Both Eyes Once Daily 12)  Cymbalta 60 Mg  Cpep (Duloxetine Hcl) .... Take 1 By Mouth Two Times A Day Qd 13)  Lunesta 3 Mg  Tabs (Eszopiclone) .... Take 1 By Mouth Qhs 14)  Klor-Con M20 20 Meq  Tbcr (Potassium Chloride Crys Cr) .... Take 1 By Mouth Qd 15)  Nitrostat 0.4 Mg  Subl (Nitroglycerin) .... Use Prn 16)   Triamterene-Hctz 37.5-25 Mg Tabs (Triamterene-Hctz) .... Take 1 Tablet By Mouth Once A Day 17)  Metformin Hcl 500 Mg Xr24h-Tab (Metformin Hcl) .... 2 Qam 18)  Zocor 80 Mg Tabs (Simvastatin) .Marland Kitchen.. 1 Tab Every Day  Allergies (verified): 1)  ! Verapamil 2)  ! Wellbutrin 3)  ! Tetracycline 4)  Doxycycline Hyclate (Doxycycline Hyclate) 5)  Verelan Pm (Verapamil Hcl)  Past History:  Past medical, surgical, family and social histories (including risk factors) reviewed, and no changes noted (except as noted below).  Past Medical History: Insomnia Dyslipidemia DUODENITIS (ICD-535.60) ESOPHAGEAL STRICTURE (ICD-530.3) TRANSAMINASES, SERUM, ELEVATED (ICD-790.4) GERD (ICD-530.81) DIABETES MELLITUS, TYPE II (ICD-250.00) HYPERTENSION (ICD-401.9) CORONARY ARTERY DISEASE (ICD-414.00) ALLERGIC RHINITIS (ICD-477.9) Empyema  Past Surgical History: EGD (03/20/2007) VATS procedure 2011  Family History: Reviewed history from 10/27/2009 and no changes required. mother had uncertain type of cancer (deceased)  Social History: Reviewed history from 10/27/2009 and no changes required. disabled married  Vital Signs:  Patient profile:   65 year old male Height:      69 inches Weight:      162 pounds BMI:     24.01 Pulse rate:   74 / minute BP sitting:   150 / 80  (left  arm) Cuff size:   regular  Vitals Entered By: Burnett Kanaris, CNA (June 03, 2010 2:40 PM)  Physical Exam  Additional Exam:  patient is in NAD HEENT:  Normocephalic, atraumatic. EOMI, PERRLA.  Neck: JVP is normal. No thyromegaly. No bruits.  Lungs: clear to auscultation. No rales no wheezes.  Heart: Regular rate and rhythm. Normal S1, S2. No S3.   No significant murmurs. PMI not displaced.  Abdomen:  Supple, nontender. Normal bowel sounds. No masses. MIld hepatomegaly.  Extremities:   Good distal pulses throughout. No lower extremity edema.  Musculoskeletal :moving all extremities.  Neuro:   alert and oriented  x3.    EKG  Procedure date:  06/03/2010  Findings:      NSR.  69 bpm.  RBBB  Impression & Recommendations:  Problem # 1:  CORONARY ARTERY DISEASE (ICD-414.00) Symptoms appear stable.  Continue meds.  Problem # 2:  HYPERCHOLESTEROLEMIA (ICD-272.0) Keep on same regimen.  Will need t review numbers.  Has been on current regimen for awhile. His updated medication list for this problem includes:    Zocor 80 Mg Tabs (Simvastatin) .Marland Kitchen... 1 tab every day  Other Orders: EKG w/ Interpretation (93000) TLB-BMP (Basic Metabolic Panel-BMET) (80048-METABOL) TLB-CBC Platelet - w/Differential (85025-CBCD) TLB-Hepatic/Liver Function Pnl (80076-HEPATIC)  Patient Instructions: 1)  Your physician recommends that you return for lab work in: lab work today...we will call you with results. 2)  Your physician wants you to follow-up in:  8 months You will receive a reminder letter in the mail two months in advance. If you don't receive a letter, please call our office to schedule the follow-up appointment.

## 2010-09-21 NOTE — Consult Note (Signed)
[facility]   Imported By: Roderic Ovens 04/09/2010 15:09:10  _____________________________________________________________________  External Attachment:    Type:   Image     Comment:   External Document

## 2010-09-23 NOTE — Progress Notes (Signed)
Summary: Need to stop Plavix due to surgery   Phone Note Call from Patient Call back at Home Phone [phone]   Caller: Patient Summary of Call: Pt having dental sugery and need to know when he should stop his Plavix and how long Initial call taken by: Judie Grieve,  August 04, 2010 3:32 PM  Follow-up for Phone Call        Pt needs to have top teeth removed and plate put in. Dentist has told him that oral surgeon will want him to come off Plavix before procedure. Surgery not scheduled yet. I told pt I would forward note to Dr. Tenny Craw to see if OK to stop Plavix and for how long prior to procedure.  Pt. aware Dr.Ross is out of office until Monday. Follow-up by: Dossie Arbour, RN, BSN,  August 04, 2010 3:46 PM  Additional Follow-up for Phone Call Additional follow up Details #1::        OK to stop plavix prior to surgery.  Stop 5 days prior.  Resume after. Additional Follow-up by: Sherrill Raring, MD, [facility],  August 09, 2010 1:37 PM     Appended Document: Need to stop Plavix due to surgery PT AWARE./CY

## 2010-10-12 ENCOUNTER — Encounter: Payer: Self-pay | Admitting: Internal Medicine

## 2010-11-02 NOTE — Medication Information (Signed)
[facility]: [facility]   Imported By: Kassie Mends 10/28/2010 07:58:41  _____________________________________________________________________  External Attachment:    Type:   Image     Comment:   External Document

## 2010-11-04 LAB — DIFFERENTIAL
Basophils Absolute: 0 10*3/uL (ref 0.0–0.1)
Basophils Relative: 0 % (ref 0–1)
Basophils Relative: 0 % (ref 0–1)
Eosinophils Absolute: 0.1 10*3/uL (ref 0.0–0.7)
Eosinophils Absolute: 0.1 10*3/uL (ref 0.0–0.7)
Eosinophils Relative: 1 % (ref 0–5)
Lymphs Abs: 1.2 10*3/uL (ref 0.7–4.0)
Monocytes Relative: 5 % (ref 3–12)
Monocytes Relative: 5 % (ref 3–12)
Neutro Abs: 9.2 10*3/uL — ABNORMAL HIGH (ref 1.7–7.7)
Neutrophils Relative %: 80 % — ABNORMAL HIGH (ref 43–77)
Neutrophils Relative %: 85 % — ABNORMAL HIGH (ref 43–77)

## 2010-11-04 LAB — GLUCOSE, CAPILLARY
Glucose-Capillary: 110 mg/dL — ABNORMAL HIGH (ref 70–99)
Glucose-Capillary: 112 mg/dL — ABNORMAL HIGH (ref 70–99)
Glucose-Capillary: 116 mg/dL — ABNORMAL HIGH (ref 70–99)
Glucose-Capillary: 130 mg/dL — ABNORMAL HIGH (ref 70–99)

## 2010-11-04 LAB — BASIC METABOLIC PANEL
BUN: 4 mg/dL — ABNORMAL LOW (ref 6–23)
CO2: 28 mEq/L (ref 19–32)
CO2: 29 mEq/L (ref 19–32)
Calcium: 7.6 mg/dL — ABNORMAL LOW (ref 8.4–10.5)
Calcium: 7.7 mg/dL — ABNORMAL LOW (ref 8.4–10.5)
Chloride: 98 mEq/L (ref 96–112)
Creatinine, Ser: 0.51 mg/dL (ref 0.4–1.5)
GFR calc non Af Amer: >60 mL/min (ref >60)
Glucose, Bld: 98 mg/dL (ref 70–99)
Potassium: 3.2 mEq/L — ABNORMAL LOW (ref 3.5–5.1)

## 2010-11-04 LAB — CBC
Hemoglobin: 9.8 g/dL — ABNORMAL LOW (ref 13.0–17.0)
MCH: 32.6 pg (ref 26.0–34.0)
MCHC: 33.8 g/dL (ref 30.0–36.0)
MCHC: 34.1 g/dL (ref 30.0–36.0)
MCV: 95.6 fL (ref 78.0–100.0)
Platelets: 338 10*3/uL (ref 150–400)
RDW: 12.6 % (ref 11.5–15.5)
RDW: 12.8 % (ref 11.5–15.5)
WBC: 11.8 10*3/uL — ABNORMAL HIGH (ref 4.0–10.5)

## 2010-11-04 LAB — PHOSPHORUS
Phosphorus: 2.5 mg/dL (ref 2.3–4.6)
Phosphorus: 2.8 mg/dL (ref 2.3–4.6)

## 2010-11-05 LAB — GLUCOSE, CAPILLARY
Glucose-Capillary: 114 mg/dL — ABNORMAL HIGH (ref 70–99)
Glucose-Capillary: 114 mg/dL — ABNORMAL HIGH (ref 70–99)
Glucose-Capillary: 118 mg/dL — ABNORMAL HIGH (ref 70–99)
Glucose-Capillary: 119 mg/dL — ABNORMAL HIGH (ref 70–99)
Glucose-Capillary: 120 mg/dL — ABNORMAL HIGH (ref 70–99)
Glucose-Capillary: 124 mg/dL — ABNORMAL HIGH (ref 70–99)
Glucose-Capillary: 128 mg/dL — ABNORMAL HIGH (ref 70–99)
Glucose-Capillary: 128 mg/dL — ABNORMAL HIGH (ref 70–99)
Glucose-Capillary: 130 mg/dL — ABNORMAL HIGH (ref 70–99)
Glucose-Capillary: 136 mg/dL — ABNORMAL HIGH (ref 70–99)
Glucose-Capillary: 148 mg/dL — ABNORMAL HIGH (ref 70–99)
Glucose-Capillary: 149 mg/dL — ABNORMAL HIGH (ref 70–99)
Glucose-Capillary: 150 mg/dL — ABNORMAL HIGH (ref 70–99)
Glucose-Capillary: 151 mg/dL — ABNORMAL HIGH (ref 70–99)
Glucose-Capillary: 153 mg/dL — ABNORMAL HIGH (ref 70–99)
Glucose-Capillary: 92 mg/dL (ref 70–99)

## 2010-11-05 LAB — BASIC METABOLIC PANEL
BUN: 2 mg/dL — ABNORMAL LOW (ref 6–23)
BUN: 4 mg/dL — ABNORMAL LOW (ref 6–23)
BUN: 6 mg/dL (ref 6–23)
BUN: 8 mg/dL (ref 6–23)
BUN: 8 mg/dL (ref 6–23)
BUN: 9 mg/dL (ref 6–23)
CO2: 21 mEq/L (ref 19–32)
CO2: 22 mEq/L (ref 19–32)
CO2: 24 mEq/L (ref 19–32)
CO2: 28 mEq/L (ref 19–32)
Calcium: 7.4 mg/dL — ABNORMAL LOW (ref 8.4–10.5)
Calcium: 7.8 mg/dL — ABNORMAL LOW (ref 8.4–10.5)
Calcium: 7.8 mg/dL — ABNORMAL LOW (ref 8.4–10.5)
Calcium: 7.9 mg/dL — ABNORMAL LOW (ref 8.4–10.5)
Calcium: 8 mg/dL — ABNORMAL LOW (ref 8.4–10.5)
Chloride: 100 mEq/L (ref 96–112)
Chloride: 102 mEq/L (ref 96–112)
Chloride: 96 mEq/L (ref 96–112)
Chloride: 99 mEq/L (ref 96–112)
Chloride: 99 mEq/L (ref 96–112)
Creatinine, Ser: 0.47 mg/dL (ref 0.4–1.5)
Creatinine, Ser: 0.54 mg/dL (ref 0.4–1.5)
Creatinine, Ser: 0.57 mg/dL (ref 0.4–1.5)
Creatinine, Ser: 0.58 mg/dL (ref 0.4–1.5)
Creatinine, Ser: 0.58 mg/dL (ref 0.4–1.5)
GFR calc Af Amer: >60 mL/min (ref >60)
GFR calc Af Amer: >60 mL/min (ref >60)
GFR calc Af Amer: >60 mL/min (ref >60)
GFR calc Af Amer: >60 mL/min (ref >60)
GFR calc Af Amer: >60 mL/min (ref >60)
GFR calc non Af Amer: >60 mL/min (ref >60)
GFR calc non Af Amer: >60 mL/min (ref >60)
GFR calc non Af Amer: >60 mL/min (ref >60)
GFR calc non Af Amer: >60 mL/min (ref >60)
GFR calc non Af Amer: >60 mL/min (ref >60)
Glucose, Bld: 106 mg/dL — ABNORMAL HIGH (ref 70–99)
Glucose, Bld: 112 mg/dL — ABNORMAL HIGH (ref 70–99)
Glucose, Bld: 115 mg/dL — ABNORMAL HIGH (ref 70–99)
Glucose, Bld: 115 mg/dL — ABNORMAL HIGH (ref 70–99)
Glucose, Bld: 128 mg/dL — ABNORMAL HIGH (ref 70–99)
Potassium: 3.3 mEq/L — ABNORMAL LOW (ref 3.5–5.1)
Potassium: 3.3 mEq/L — ABNORMAL LOW (ref 3.5–5.1)
Potassium: 3.7 mEq/L (ref 3.5–5.1)
Potassium: 3.8 mEq/L (ref 3.5–5.1)
Sodium: 132 mEq/L — ABNORMAL LOW (ref 135–145)
Sodium: 134 mEq/L — ABNORMAL LOW (ref 135–145)
Sodium: 136 mEq/L (ref 135–145)
Sodium: 136 mEq/L (ref 135–145)

## 2010-11-05 LAB — DIFFERENTIAL
Basophils Absolute: 0 10*3/uL (ref 0.0–0.1)
Basophils Absolute: 0 10*3/uL (ref 0.0–0.1)
Basophils Absolute: 0 10*3/uL (ref 0.0–0.1)
Basophils Absolute: 0 10*3/uL (ref 0.0–0.1)
Basophils Absolute: 0 10*3/uL (ref 0.0–0.1)
Basophils Absolute: 0 K/uL (ref 0.0–0.1)
Basophils Relative: 0 % (ref 0–1)
Basophils Relative: 0 % (ref 0–1)
Basophils Relative: 0 % (ref 0–1)
Basophils Relative: 0 % (ref 0–1)
Basophils Relative: 0 % (ref 0–1)
Eosinophils Absolute: 0 10*3/uL (ref 0.0–0.7)
Eosinophils Absolute: 0 10*3/uL (ref 0.0–0.7)
Eosinophils Absolute: 0 K/uL (ref 0.0–0.7)
Eosinophils Absolute: 0.1 10*3/uL (ref 0.0–0.7)
Eosinophils Absolute: 0.2 10*3/uL (ref 0.0–0.7)
Eosinophils Relative: 0 % (ref 0–5)
Eosinophils Relative: 0 % (ref 0–5)
Eosinophils Relative: 0 % (ref 0–5)
Eosinophils Relative: 1 % (ref 0–5)
Lymphocytes Relative: 10 % — ABNORMAL LOW (ref 12–46)
Lymphocytes Relative: 4 % — ABNORMAL LOW (ref 12–46)
Lymphocytes Relative: 6 % — ABNORMAL LOW (ref 12–46)
Lymphocytes Relative: 8 % — ABNORMAL LOW (ref 12–46)
Lymphocytes Relative: 9 % — ABNORMAL LOW (ref 12–46)
Lymphs Abs: 0.7 K/uL (ref 0.7–4.0)
Lymphs Abs: 1 10*3/uL (ref 0.7–4.0)
Lymphs Abs: 1 10*3/uL (ref 0.7–4.0)
Lymphs Abs: 1 10*3/uL (ref 0.7–4.0)
Lymphs Abs: 1.2 10*3/uL (ref 0.7–4.0)
Lymphs Abs: 1.2 10*3/uL (ref 0.7–4.0)
Lymphs Abs: 1.3 10*3/uL (ref 0.7–4.0)
Monocytes Absolute: 0.5 10*3/uL (ref 0.1–1.0)
Monocytes Absolute: 0.6 10*3/uL (ref 0.1–1.0)
Monocytes Absolute: 0.9 10*3/uL (ref 0.1–1.0)
Monocytes Absolute: 0.9 K/uL (ref 0.1–1.0)
Monocytes Absolute: 1 10*3/uL (ref 0.1–1.0)
Monocytes Relative: 5 % (ref 3–12)
Monocytes Relative: 5 % (ref 3–12)
Monocytes Relative: 6 % (ref 3–12)
Monocytes Relative: 6 % (ref 3–12)
Monocytes Relative: 6 % (ref 3–12)
Neutro Abs: 10 10*3/uL — ABNORMAL HIGH (ref 1.7–7.7)
Neutro Abs: 10.9 10*3/uL — ABNORMAL HIGH (ref 1.7–7.7)
Neutro Abs: 11.4 10*3/uL — ABNORMAL HIGH (ref 1.7–7.7)
Neutro Abs: 12.4 10*3/uL — ABNORMAL HIGH (ref 1.7–7.7)
Neutro Abs: 12.7 10*3/uL — ABNORMAL HIGH (ref 1.7–7.7)
Neutro Abs: 13.8 K/uL — ABNORMAL HIGH (ref 1.7–7.7)
Neutro Abs: 15.2 10*3/uL — ABNORMAL HIGH (ref 1.7–7.7)
Neutrophils Relative %: 83 % — ABNORMAL HIGH (ref 43–77)
Neutrophils Relative %: 86 % — ABNORMAL HIGH (ref 43–77)
Neutrophils Relative %: 86 % — ABNORMAL HIGH (ref 43–77)
Neutrophils Relative %: 87 % — ABNORMAL HIGH (ref 43–77)
Neutrophils Relative %: 90 % — ABNORMAL HIGH (ref 43–77)

## 2010-11-05 LAB — COMPREHENSIVE METABOLIC PANEL
ALT: 20 U/L (ref 0–53)
ALT: 45 U/L (ref 0–53)
AST: 31 U/L (ref 0–37)
AST: 61 U/L — ABNORMAL HIGH (ref 0–37)
Albumin: 1.7 g/dL — ABNORMAL LOW (ref 3.5–5.2)
Alkaline Phosphatase: 107 U/L (ref 39–117)
BUN: 6 mg/dL (ref 6–23)
CO2: 27 mEq/L (ref 19–32)
CO2: 28 mEq/L (ref 19–32)
Calcium: 7.4 mg/dL — ABNORMAL LOW (ref 8.4–10.5)
Chloride: 102 mEq/L (ref 96–112)
Chloride: 97 mEq/L (ref 96–112)
Creatinine, Ser: 0.56 mg/dL (ref 0.4–1.5)
Creatinine, Ser: 0.59 mg/dL (ref 0.4–1.5)
GFR calc Af Amer: >60 mL/min (ref >60)
GFR calc Af Amer: >60 mL/min (ref >60)
GFR calc non Af Amer: >60 mL/min (ref >60)
GFR calc non Af Amer: >60 mL/min (ref >60)
Glucose, Bld: 128 mg/dL — ABNORMAL HIGH (ref 70–99)
Glucose, Bld: 130 mg/dL — ABNORMAL HIGH (ref 70–99)
Potassium: 3.9 mEq/L (ref 3.5–5.1)
Sodium: 134 mEq/L — ABNORMAL LOW (ref 135–145)
Sodium: 136 mEq/L (ref 135–145)
Total Bilirubin: 0.5 mg/dL (ref 0.3–1.2)
Total Bilirubin: 0.6 mg/dL (ref 0.3–1.2)
Total Protein: 4.9 g/dL — ABNORMAL LOW (ref 6.0–8.3)

## 2010-11-05 LAB — MRSA PCR SCREENING: MRSA by PCR: NEGATIVE

## 2010-11-05 LAB — CBC
HCT: 32.6 % — ABNORMAL LOW (ref 39.0–52.0)
HCT: 32.9 % — ABNORMAL LOW (ref 39.0–52.0)
HCT: 33.7 % — ABNORMAL LOW (ref 39.0–52.0)
HCT: 36.1 % — ABNORMAL LOW (ref 39.0–52.0)
HCT: 36.4 % — ABNORMAL LOW (ref 39.0–52.0)
HCT: 38.3 % — ABNORMAL LOW (ref 39.0–52.0)
Hemoglobin: 11 g/dL — ABNORMAL LOW (ref 13.0–17.0)
Hemoglobin: 11.1 g/dL — ABNORMAL LOW (ref 13.0–17.0)
Hemoglobin: 11.6 g/dL — ABNORMAL LOW (ref 13.0–17.0)
Hemoglobin: 11.9 g/dL — ABNORMAL LOW (ref 13.0–17.0)
Hemoglobin: 12.2 g/dL — ABNORMAL LOW (ref 13.0–17.0)
Hemoglobin: 13.9 g/dL (ref 13.0–17.0)
MCH: 32.8 pg (ref 26.0–34.0)
MCH: 32.8 pg (ref 26.0–34.0)
MCH: 32.8 pg (ref 26.0–34.0)
MCH: 33.1 pg (ref 26.0–34.0)
MCH: 33.9 pg (ref 26.0–34.0)
MCH: 34.8 pg — ABNORMAL HIGH (ref 26.0–34.0)
MCHC: 33.5 g/dL (ref 30.0–36.0)
MCHC: 33.7 g/dL (ref 30.0–36.0)
MCHC: 33.7 g/dL (ref 30.0–36.0)
MCHC: 34.5 g/dL (ref 30.0–36.0)
MCHC: 35.3 g/dL (ref 30.0–36.0)
MCV: 96 fL (ref 78.0–100.0)
MCV: 96.1 fL (ref 78.0–100.0)
MCV: 97 fL (ref 78.0–100.0)
MCV: 97.3 fL (ref 78.0–100.0)
MCV: 97.8 fL (ref 78.0–100.0)
Platelets: 260 10*3/uL (ref 150–400)
Platelets: 267 10*3/uL (ref 150–400)
Platelets: 275 10*3/uL (ref 150–400)
Platelets: 279 10*3/uL (ref 150–400)
Platelets: 294 10*3/uL (ref 150–400)
Platelets: 343 10*3/uL (ref 150–400)
RBC: 3.35 MIL/uL — ABNORMAL LOW (ref 4.22–5.81)
RBC: 3.38 MIL/uL — ABNORMAL LOW (ref 4.22–5.81)
RBC: 3.51 MIL/uL — ABNORMAL LOW (ref 4.22–5.81)
RBC: 3.57 MIL/uL — ABNORMAL LOW (ref 4.22–5.81)
RBC: 3.57 MIL/uL — ABNORMAL LOW (ref 4.22–5.81)
RBC: 3.72 MIL/uL — ABNORMAL LOW (ref 4.22–5.81)
RBC: 3.99 MIL/uL — ABNORMAL LOW (ref 4.22–5.81)
RDW: 12.6 % (ref 11.5–15.5)
RDW: 12.8 % (ref 11.5–15.5)
RDW: 12.8 % (ref 11.5–15.5)
RDW: 12.9 % (ref 11.5–15.5)
RDW: 13.1 % (ref 11.5–15.5)
RDW: 13.1 % (ref 11.5–15.5)
WBC: 11.7 10*3/uL — ABNORMAL HIGH (ref 4.0–10.5)
WBC: 13.7 10*3/uL — ABNORMAL HIGH (ref 4.0–10.5)
WBC: 14.5 10*3/uL — ABNORMAL HIGH (ref 4.0–10.5)
WBC: 14.8 10*3/uL — ABNORMAL HIGH (ref 4.0–10.5)
WBC: 17.4 10*3/uL — ABNORMAL HIGH (ref 4.0–10.5)

## 2010-11-05 LAB — LACTATE DEHYDROGENASE, PLEURAL OR PERITONEAL FLUID: LD, Fluid: 1344 U/L — ABNORMAL HIGH (ref 3–23)

## 2010-11-05 LAB — URINE CULTURE
Colony Count: NO GROWTH
Colony Count: NO GROWTH
Culture  Setup Time: 201108140227
Culture: NO GROWTH
Culture: NO GROWTH

## 2010-11-05 LAB — POCT I-STAT 3, ART BLOOD GAS (G3+)
Bicarbonate: 23.8 mEq/L (ref 20.0–24.0)
Patient temperature: 98
pH, Arterial: 7.438 (ref 7.350–7.450)
pO2, Arterial: 65 mmHg — ABNORMAL LOW (ref 80.0–100.0)

## 2010-11-05 LAB — URINALYSIS, ROUTINE W REFLEX MICROSCOPIC
Bilirubin Urine: NEGATIVE
Glucose, UA: NEGATIVE mg/dL
Hgb urine dipstick: NEGATIVE
Ketones, ur: NEGATIVE mg/dL
Protein, ur: NEGATIVE mg/dL
Urobilinogen, UA: 0.2 mg/dL (ref 0.0–1.0)

## 2010-11-05 LAB — URINALYSIS, MICROSCOPIC ONLY
Hgb urine dipstick: NEGATIVE
Nitrite: NEGATIVE
Specific Gravity, Urine: 1.028 (ref 1.005–1.030)
Urobilinogen, UA: 4 mg/dL — ABNORMAL HIGH (ref 0.0–1.0)

## 2010-11-05 LAB — BODY FLUID CELL COUNT WITH DIFFERENTIAL
Eos, Fluid: 0 %
Monocyte-Macrophage-Serous Fluid: 3 % — ABNORMAL LOW (ref 50–90)

## 2010-11-05 LAB — PROTIME-INR
INR: 1.17 (ref 0.00–1.49)
INR: 1.19 (ref 0.00–1.49)
Prothrombin Time: 15.1 s (ref 11.6–15.2)
Prothrombin Time: 15.3 seconds — ABNORMAL HIGH (ref 11.6–15.2)

## 2010-11-05 LAB — AMYLASE: Amylase: 12 U/L (ref 0–105)

## 2010-11-05 LAB — MAGNESIUM
Magnesium: 1.7 mg/dL (ref 1.5–2.5)
Magnesium: 1.7 mg/dL (ref 1.5–2.5)
Magnesium: 2 mg/dL (ref 1.5–2.5)
Magnesium: 2.1 mg/dL (ref 1.5–2.5)

## 2010-11-05 LAB — TISSUE CULTURE

## 2010-11-05 LAB — HEMOGLOBIN A1C
Hgb A1c MFr Bld: 6 % — ABNORMAL HIGH (ref <5.7)
Mean Plasma Glucose: 126 mg/dL — ABNORMAL HIGH (ref <117)

## 2010-11-05 LAB — CULTURE, BLOOD (ROUTINE X 2): Culture: NO GROWTH

## 2010-11-05 LAB — TYPE AND SCREEN
ABO/RH(D): A POS
Antibody Screen: NEGATIVE

## 2010-11-05 LAB — FUNGUS CULTURE W SMEAR

## 2010-11-05 LAB — ANAEROBIC CULTURE

## 2010-11-05 LAB — GRAM STAIN

## 2010-11-05 LAB — TRIGLYCERIDES, BODY FLUIDS

## 2010-11-05 LAB — ABO/RH: ABO/RH(D): A POS

## 2010-11-05 LAB — LIPASE, BLOOD: Lipase: 16 U/L (ref 11–59)

## 2010-11-05 LAB — APTT
aPTT: 20 s — ABNORMAL LOW (ref 24–37)
aPTT: 26 seconds (ref 24–37)

## 2010-11-05 LAB — PLATELET FUNCTION ASSAY: Collagen / Epinephrine: 97 seconds (ref 0–184)

## 2010-11-05 LAB — PHOSPHORUS
Phosphorus: 2.5 mg/dL (ref 2.3–4.6)
Phosphorus: 2.8 mg/dL (ref 2.3–4.6)
Phosphorus: 3.8 mg/dL (ref 2.3–4.6)

## 2010-11-05 LAB — LACTATE DEHYDROGENASE: LDH: 240 U/L (ref 94–250)

## 2010-11-05 LAB — BODY FLUID CULTURE

## 2010-11-05 LAB — AFB CULTURE WITH SMEAR (NOT AT ARMC)

## 2010-11-05 LAB — PROTEIN, BODY FLUID: Total protein, fluid: 4.7 g/dL

## 2010-11-05 LAB — PLATELET INHIBITION P2Y12
P2Y12 % Inhibition: 0 %
Platelet Function  P2Y12: 343 [PRU] (ref 194–418)
Platelet Function Baseline: 229 [PRU] (ref 194–418)

## 2010-11-05 LAB — CULTURE, RESPIRATORY W GRAM STAIN

## 2010-11-05 LAB — PATHOLOGIST SMEAR REVIEW

## 2011-01-04 NOTE — Consult Note (Signed)
[facility]                          ENDOCRINOLOGY CONSULTATION   [name], [name]                     MRN:          [id number]  DATE:04/05/2007                            DOB:          [birth year]    REFERRING PHYSICIAN:  Judie Petit T. Russella Dar, MD, [facility]   REASON FOR REFERRAL:  Diabetes.   HISTORY OF THE PRESENT ILLNESS:  A 65 year old man who was diagnosed  with type 2 diabetes several months ago.  He has no known complications.  His glucoses vary from 87 to 115.  Symptomatically, he has 1 year of  severe pain on his feet which he describes as on fire.  He has some  associated numbness.  He describes his diet as fair, and his diet as  good.   PAST MEDICAL HISTORY:  1. CAD.  2. Insomnia.  3. Dyslipidemia.  4. Allergic rhinitis.  5. Hypertension.   MEDICATIONS:  He has an extensive list that I have reviewed today.  He  is not currently on any medicine for diabetes.   SOCIAL HISTORY:  1. He is married.  2. He is disabled.   FAMILY HISTORY:  Positive for diabetes in his brother and a grandfather.   REVIEW OF SYSTEMS:  He has lost 15 pounds of weight in the past few  months.  Denies hypoglycemia.   PHYSICAL EXAMINATION:  Blood pressure 124/78, heart rate 75, temperature  is 98, the weight is 185.  GENERAL:  No distress.  Skin not diaphoretic, no rash.  HEENT:  No proptosis, no periorbital swelling.  Pharynx is normal.  NECK:  No goiter.  CHEST:  Clear to auscultation.  No respiratory distress.  CARDIOVASCULAR:  Trace bilateral pretibial edema, regular rate and  rhythm, no murmur.  Pedal pulses are intact and carotid arteries have no  bruit.  FEET:  He has bilateral varicose veins and onychomycosis.  There is no  ulcer present on the feet.  The feet are of otherwise normal color and  temperature.  NEUROLOGIC:  Alert, oriented, does not appear anxious nor depressed and  sensation is intact to touch in the feet, but decreased from  normal.   LABORATORY STUDIES:  On April 05, 2007, Vitamin B12 232, hemoglobin A1c  6.3.  On October 31, 2006, GOT 52, GPT is 49.   IMPRESSION:  1. Type 2 diabetes and his A1c probably has improved with his recent      weight loss.  2. Foot symptoms, probably due to peripheral neuropathy.  3. Mild edema limits thiazolidinedione therapy.  4. Renal insufficiency prevents metformin therapy.  5. 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   PLAN:  1. We discussed the risk of diabetes as well as the importance of diet      and exercise therapy.  2. Because of some high glucose he is having, he is prescribed Januvia      100 mg a day.  3. Return in 30 days.     Sean A. Everardo All, MD  Electronically Signed    SAE/MedQ  DD: 04/08/2007  DT: 04/09/2007  Job #: [id number]   cc:   Venita Lick. Russella Dar,  MD, Herbie Saxon, M.D.

## 2011-01-04 NOTE — Assessment & Plan Note (Signed)
[facility]                            CARDIOLOGY OFFICE NOTE   NAME:Mantione, TEOFILO LUPINACCI                     MRN:          [id number]  DATE:02/27/2007                            DOB:          Nov 11, 1945    IDENTIFICATION:  [name] is a 65 year old gentleman with a history  of CAD.  He was last seen in January of this year.   From a cardiac standpoint he has done okay.  He has had significant  problems with his back and requiring more OxyContin, this has led to  abdominal bloating and distention and he was just seen in GI and he was  told to take a laxative and told to back down on his opiate.  He is  scheduled for an endoscopy later in the month.  He denies chest pain,  breathing is fair, again is okay except for the bloating.   CURRENT MEDICATIONS:  1. OxyContin as directed.  2. Robaxin 75 as directed.  3. Primidone 50 nightly.  4. Aspirin 81.  5. Plavix 75.  6. Lisinopril 20.  7. Toprol XL 25.  8. Maxzide 25.  9. Zyrtec 10.  10.Voltaren eye drops.  11.Optivar eye drops.  12.Nexium 40 two daily.  13.Cymbalta 60 b.i.d.  14.Lunesta 3 nightly.  15.Zocor 40.  16.K-Dur 20 mEq.   PHYSICAL EXAMINATION:  Patient currently is in no distress.  Blood  pressure 100/61, lower than usual.  Pulse is in the 60s.  Weight 184,  this is actually down from 203 back in January.  LUNGS:  Clear.  CARDIAC:  Regular rate and rhythm, S1-S2, no S3, no murmurs.  ABDOMEN:  Nontender but distended.  EXTREMITIES:  No edema.   IMPRESSION:  1. Coronary artery disease.  Status post percutaneous transluminal      coronary angioplasty stent to the circumflex and left anterior      descending in 2005, would continue on current regimen.  2. Dyslipidemia.  Good control by his numbers back in March with an      LDL of 70, HDL is a tad low at 26, triglycerides 131.  Again,      encouraged him to increase his activity, would not add anything for      right now given what  he is going through.  3. Gastroenterology seen and as noted above.   I will set to see him back in about 6 months, sooner if problems  develop.     Pricilla Riffle, MD, [facility]  Electronically Signed    PVR/MedQ  DD: 02/27/2007  DT: 02/28/2007  Job #: [id number]   cc:   Windle Guard, M.D.

## 2011-01-04 NOTE — Assessment & Plan Note (Signed)
OFFICE VISIT   [name], [name]  DOB:  02/23/1946                                        April 27, 2010  CHART #:  [id number]   HISTORY:  The patient is a 65 year old male who underwent bronchoscopy,  right video-assisted thoracoscopy, mini thoracotomy with decortication  and evacuation of empyema on March 31, 2010, by Dr. Tyrone Sage.  This was  for a right lower lobe pneumonia with empyema.  He is seen in today's  date in routine office followup.  Currently, he reports that he has some  discomfort in the right chest incision with some radiation into the  anterior pectoralis region.  He describes this as 6-7 on a scale of 1-  10.  He does admit and history indicates that he has significant ongoing  long-term pain issues related to his back and he is on chronic narcotics  managed by a Pain Clinic.  He does feel as though management of his pain  is adequate at this time and is not requesting any additional  medication.  He denies cough.  He denies sputum production.  He denies  fevers, chills, or other constitutional symptoms.  He has some mild  difficulty with shortness of breath.   Chest x-ray was obtained on today's date.  It reveals some right-sided  inflammatory changes, which have improved over time from previous exam.   PHYSICAL EXAMINATION:  Vital Signs:  Blood pressure is 180/91, pulse is  106, respirations 16, and oxygen saturation is 94% on room air.  General  Appearance:  This is a somewhat chronically ill-appearing male, in no  acute distress.  Pulmonary:  Diminished breath sounds in the right base.  Cardiac:  Regular rate and rhythm, tachycardiac.  Normal S1 and S2.  Incisions are inspected, healing well without evidence of infection.   ASSESSMENT:  The patient does show ongoing improvement in regard to his  chest x-ray findings following his decortication of his empyema.  There  is still some inflammatory changes.  He still has some  moderate  discomfort associated with this.  He does not appear to be actively  infected and does not appear to require antibiotics at this time.  He  has multiple medical issues and I feel these are contributing to the  majority of his feelings of unwellness.  In addition, his chronic pain  is also contributing to his discomfort as relates to overall wellbeing.  I have advised him to continue his ongoing management through the Pain  Clinic as well as with his primary physician to maximize his medical  state and he appears to have a good understanding that these are all  interrelated.  We will see him again in the office in 1 month with a  repeat chest x-ray to see where we stand in regards to overall  improvement in his chest symptoms and x-ray appearance.   Rowe Clack, P.A.-C.   [name]  D:  04/27/2010  T:  04/28/2010  Job:  [id number]   cc:   Windle Guard, M.D.  Pricilla Riffle, MD, [facility]

## 2011-01-04 NOTE — Assessment & Plan Note (Signed)
[place] [facility]                            CARDIOLOGY OFFICE NOTE   NAME:[name], [name]                     MRN:          [id number]  DATE:11/15/2007                            DOB:          April 01, 1946    IDENTIFICATION:  [name] is a 65 year old gentleman with a history  of CAD (status post PTCA/stent to the circumflex and LAD in 2005, drug-  eluting).  I last saw him back in July of last year.   In the interval he is followed in primary care.  He is also followed by  Dr. Otelia Sergeant of neurosurgery.  He continues to complain of significant back  pain.   His breathing has been good.  He denies chest pain.  Activity, of  course, is limited because of his back.   CURRENT MEDICINES:  1. Januvia 100 mg.  2. OxyContin 60 mg q.8 h.  3. Hydrocodone 15 mg q.4 h. p.r.n.  4. Robaxin 750 mg as directed.  5. Primidone 50 mg nightly.  6. Aspirin 81 mg.  7. Plavix 75 mg.  8. Lisinopril 20 mg.  9. Toprol XL 25 mg.  10.Maxzide 25 mg.  11.Xalatan and Optivar eye drops.  12.Nexium.  13.Cymbalta 60 mg b.i.d.  14.Lunesta 3 mg.  15.Zocor 40 mg.  16.K-Dur 20 mEq.  17.Celebrex 200 mg.   PHYSICAL EXAM:  The patient is in no distress.  Blood pressure is 124/68, pulse is 86 and regular, weight 197, which is  up from 182 in July.  LUNGS:  Clear.  NECK:  No JVD.  No bruit.  CARDIAC:  Regular rate and rhythm, S1 and S2, no S3.  ABDOMEN:  Benign.  ABDOMEN:  Benign, obese.  EXTREMITIES:  No edema.   A 12-lead EKG:  Sinus rhythm, 84 beats per minute, right bundle branch  block, nonspecific ST changes.   IMPRESSION:  1. Coronary artery disease overall appears to be stable.  I do not      think there is any evidence of active symptoms.  Note, he had a      Myoview in 2006 that showed no ischemia.  I would continue on      medical therapy.  2. Dyslipidemia.  We will need to get a fasting lipid panel at his      convenience.  He will have it done locally and we  will be in touch      with him.   Otherwise, I will set to see him back in 10 months' time, sooner if  problems develop.     Pricilla Riffle, MD, [facility]  Electronically Signed    PVR/MedQ  DD: 11/15/2007  DT: 11/16/2007  Job #: [id number]   cc:   Gregary Signs A. Everardo All, MD  [name] Guard, M.D.

## 2011-01-04 NOTE — Discharge Summary (Signed)
[name], HOLLEY NO.:  [id number]   MEDICAL RECORD NO.:  [id number]          PATIENT TYPE:  INP   LOCATION:  2013                         FACILITY:  [facility]   PHYSICIAN:  Pricilla Riffle, MD, FACCDATE OF BIRTH:  [birth year]   DATE OF ADMISSION:  06/04/2008  DATE OF DISCHARGE:  06/06/2008                               DISCHARGE SUMMARY   PRIMARY CARDIOLOGIST:  Pricilla Riffle, MD, [facility]   PRIMARY CARE PHYSICIAN:  Windle Guard, MD   PROCEDURES PERFORMED DURING HOSPITALIZATION:  Cardiac catheterization.  a.  Completed by Dr. Tonny Bollman on June 05, 2008, revealing  diffuse nonobstructive CAD, widely patent stent to the left circumflex  and LAD, normal LV function with elevated LVEDP.   FINAL DISCHARGE DIAGNOSES:  1. Coronary artery disease.      a.     Status post cardiac catheterization dated June 05, 2008,       with widely patent stents and diffuse nonobstructive coronary       artery disease.      b.     Status post percutaneous coronary intervention with drug-       eluting stent to the left circumflex and left anterior descending       coronary artery in 2005.      c.     Negative stress Myoview in 2006.  2. Type 2 diabetes.  3. Hypertension.  4. Insomnia.  5. History of rhinitis.  6. Chronic back pain.  7. Anxiety.   HISTORY OF PRESENT ILLNESS:  This is a 65 year old Caucasian male with  known history of coronary artery disease with PCI using a drug-eluting  stent to the left circumflex and LAD in 2005 who presented to the  emergency room with chest discomfort.  The patient experienced sudden  onset of substernal chest discomfort radiating to his left jaw that  lasted approximately one to one and half hours.  The patient did take  nitroglycerin, but it only provided minimal relief.  As a result of  this, the patient presented to the emergency room.  The patient was seen  and examined by Dr. Raynelle Bring, cardiology fellow for Dr. Dietrich Pates  and was admitted to rule out for myocardial infarction.  The patient was  placed on sliding scale insulin, IV heparin, and planned for  catheterization the following morning.   Cardiac catheterization was completed we discussed above.  The patient  tolerated the procedure well without evidence of bleeding, hematoma, or  signs of infection or recurrence of chest discomfort.  The patient's  cardiac enzymes were cycled during hospitalization and found to be  negative.  The patient also had a mildly low magnesium of 1.9, which was  not repleted.  The patient also had pain control for chronic back pain.   Following cardiac catheterization, the patient was found to be stable  for discharge.  However, he did stay overnight and is being discharged  this morning.  He was very anxious to do so.  The patient will return  home on current medication regimen.  He has been advised on  smoking  cessation and will have a followup appointment with Dr. Dietrich Pates on  discharge.   DISCHARGE LABORATORY DATA:  Hemoglobin 14.2, hematocrit 40.9, white  blood cells 9.7, platelets 158, troponin less than 0.05, 0.01  respectively.  Sodium 135, potassium 3.6, chloride 99, CO2 of 28,  glucose 206, BUN 9, creatinine 0.91.  Chest x-ray dated June 04, 2008, revealed no acute cardiopulmonary disease, mild peribronchial  thickening, which may be related to chronic bronchitis or smoking.   DISCHARGE MEDICATIONS:  1. Potassium chloride 20 mEq daily.  2. Primidone 50 mg daily.  3. Protonix 40 mg daily.  4. Robaxin 750 daily.  5. Metoprolol 50 mg daily.  6. Zocor 40 mg daily.  7. Aspirin 81 mg daily.  8. Cymbalta 60 mg daily.  9. Hydrocodone and acetaminophen p.r.n.  10.Januvia 100 mg daily.  11.Lasix 40 mg daily.  12.Lisinopril 20 mg daily.  13.Lunesta 3 mg daily.  14.Nexium 40 mg daily.  15.Nitroglycerin p.r.n.  16.Optivar ophthalmic drops daily.  17.OxyContin 60 mg p.r.n.  18.Plavix 75 mg  daily.   ALLERGIES:  VERAPAMIL and WELLBUTRIN.   FOLLOWUP PLANS AND APPOINTMENT:  1. The patient has been given post cardiac catheterization      instructions with particular emphasis on the right groin site for      evidence of bleeding, hematoma, or signs of infection.  2. The patient has been given smoking cessation instructions.  3. The patient will have a followup appointment with Dr. Dietrich Pates      for continued cardiac management.  4. The patient has been advised to continue medical management of      diabetes and other medical issues through his primary care      physician.   Time spent with the patient to include physician time 35 minutes.      Bettey Mare. Lyman Bishop, NP      Pricilla Riffle, MD, [facility]  Electronically Signed    KML/MEDQ  D:  06/06/2008  T:  06/06/2008  Job:  [id number]   cc:   Windle Guard, M.D.

## 2011-01-04 NOTE — H&P (Signed)
[name], [name] NO.:  [id number]   MEDICAL RECORD NO.:  [id number]          PATIENT TYPE:  INP   LOCATION:  2912                         FACILITY:  [facility]   PHYSICIAN:  Brayton El, MD    DATE OF BIRTH:  September 19, 1945   DATE OF ADMISSION:  06/04/2008  DATE OF DISCHARGE:                              HISTORY & PHYSICAL   CHIEF COMPLAINTS:  Chest pain.   HISTORY OF PRESENT ILLNESS:  Mr. Bonanno is a 65 year old white male  with past medical history significant for coronary artery disease,  status post PCI with drug-eluting stents to his left circumflex and LAD  in 2005, type 2 diabetes and hypertension presenting with new onset  chest discomfort.  The patient states that this evening he experienced  sudden onset of substernal chest discomfort radiating to his left jaw  that lasted approximately 1-1/2 hours.  The patient endorses shortness  of breath that was associated with the chest discomfort and states that  nitroglycerin provided minimal relief.  After approximately 1-1/2 hours,  the pain subsided on its own.  Other than that, the patient states he  has felt mildly ill over the past few days, but is unable to articulate  any specific symptoms.  He does deny any PND, orthopnea or lower  extremity edema.  He also states that he has not had any chest  discomfort in approximately one months' time.  He is currently chest  pain free.   PAST MEDICAL HISTORY:  1. Coronary disease, status post PCI with drug-eluting stents to the      left circumflex and LAD in 2005.  He had a negative Myoview in      2006.  2. Type 2 diabetes.  3. Hypertension.  4. Insomnia.  5. Rhinitis.  6. Chronic back pain.  7. Anxiety.   FAMILY HISTORY:  Negative for premature coronary disease.   SOCIAL HISTORY:  The patient continues to smoke tobacco.  He denies any  alcohol use.   ALLERGIES:  1. VERAPAMIL.  2. WELLBUTRIN.  3. TETRACYCLINE   MEDICATIONS:  1. Aspirin 81 mg  daily.  2. Plavix 75 mg daily.  3. Lisinopril 20 mg daily.  4. Toprol XL 75 mg daily.  5. Maxzide 25 mg daily.  6. Zocor 40 mg p.o. nightly.  7. Lasix 40 mg daily.  8. Protonix 40 mg b.i.d.  9. Primidone 150 mg p.o. nightly.  10.Cymbalta 60 mg 1 at 6 a.m. and 1 at 2:00 p.m.  11.Lunesta 3 mg p.o. nightly .  12.Januvia 100 mg p.o. daily.  13.OxyContin 60 mg p.o. q.8 h.  14.Hydrocodone p.r.n.  15.Robaxin 150 mg every 8 hours p.r.n.   REVIEW OF SYSTEMS:  As in HPI.  All other systems were reviewed and are  negative.   PHYSICAL EXAMINATION:  VITAL SIGNS:  Temperature 99.4, pulse 90,  respirations 18, blood pressure 126/66.  Sating 97% on 2 liters.  GENERAL:  No acute distress.  HEENT:  Normocephalic, atraumatic.  NECK:  Supple without carotid bruit or JVD in the seated position.  HEART:  Regular rate and rhythm  without murmur, rub or gallop.  LUNGS:  Clear to auscultation bilaterally.  ABDOMEN:  Soft, nontender, nondistended.  EXTREMITIES:  Without edema.  SKIN:  Warm and dry.  NEUROLOGIC:  Nonfocal.  PSYCHIATRIC:  The patient is appropriate with normal levels of insight.   LABORATORY DATA:  Sodium and 35, potassium 3.6, chloride 99, CO2 of 28,  BUN 9, creatinine 0.9, glucose 206.  White count 5, hemoglobin 15,  hematocrit 44, platelet count 163.  His MCV is 101.6.  His magnesium is  1.9.  BNP is less than 30.  Troponin 0.01.  CK 56, MB 1.5.  Chest x-ray  is negative for any acute process.  EKG independently reviewed by myself  demonstrates normal sinus rhythm and a right bundle branch block that is  new when compared to EKG dated October 29, 2004.  He also has a mildly  prolonged QTC interval at 467 milliseconds.   ASSESSMENT:  A 65 year old white male with known coronary disease  presenting with chest discomfort concerning for unstable angina.  His  biomarkers and EKG are unremarkable with exception of a new right bundle  branch block.   PLAN:  He will be admitted to a  telemetry unit and ruled out for  myocardial infarction.  We will continue his home medications as listed  above, except we will increase his aspirin to 162 mg daily and we will  hold his Januvia and place him on sliding scale insulin instead.  We  will start him on IV heparin.  He will be made n.p.o. for a possible  left heart catheterization later in the day.      Brayton El, MD  Electronically Signed     SGA/MEDQ  D:  06/04/2008  T:  06/04/2008  Job:  [id number]

## 2011-01-04 NOTE — Assessment & Plan Note (Signed)
[place]                         GASTROENTEROLOGY OFFICE NOTE   NAME:[name]                     MRN:          [id number]  DATE:12/12/2007                            DOB:          1946/03/07    OFFICE ADD-ON VISIT   PROBLEM:  Recurrent vomiting of dark liquid.   HISTORY:  [name] is a 65 year old white male who has history of coronary  artery disease, adult onset diabetes mellitus.  He has a chronic pain  syndrome secondary to back disease, and is on chronic narcotics as well  as Neurontin.  He is a primary patient of Dr. Everardo All and is known from  a GI standpoint to Dr. Russella Dar.  He has been having recurrent problems  with  regurgitation, especially of hot liquid, about one hour after  meals.  He said that even if he eats a small amount, he is generally  vomiting at some point each day.  He said there is no distinct pattern  and no direct relationship to eating.  He has been having worsening  problems over the past month with at least one episode of vomiting every  day.  He said sometimes certain things will set it off as in hot foods  or hot liquids.  He is not have any abdominal pain, though he said his  throat is raw from vomiting.  He is maintained on b.i.d. Nexium.  His  bowel habits have been normal.  He denies any dysphagia  or odynophagia.  He does admit to early satiety symptoms and does feel that he fills up  very quickly, and said that he is eating less than his usual over the  past couple of months.  He has also lost about 10 pounds due to decrease  in p.o. intake.  He said his emesis is dark brown.  He has not noted any  gross blood.   CURRENT MEDICATIONS:  1. OxyContin 60 mg every 8 hours  2. Oxycodone 30 mg every 4 hours p.r.n.  3. Robaxin 750 every 8 hours p.r.n.  4. Primidone 50 mg t.i.d.  5. Aspirin 81 mg daily.  6. Plavix 75 mg daily.  7. Lisinopril 20 mg daily.  8. Toprol XL 50 one and a half tablets daily.  9.  Maxzide 25 daily.  10.Nitroglycerin p.r.n.  11.Xalatan eye drops at bedtime.  12.Optivar eye drops b.i.d.  13.Nexium 40 b.i.d.  14.Cymbalta 60 b.i.d.  15.Lunesta 3 mg nightly.  16.Zocor 40 nightly.  17.K-Dur 25 daily.  18.Lasix 40 mg daily.  19.Januvia 100 mg daily.  20.Neurontin 600 mg t.i.d.Marland Kitchend.   ALLERGIES/INTOLERANCES:  WELLBUTRIN, TETRACYCLINE, VERAPAMIL.   PAST HISTORY:  1. Pertinent for coronary artery disease status post PTCA and      stenting.  2. Chronic pain syndrome.  3. Adult onset diabetes mellitus.  4. Chronic GERD with history of peptic stricture.   EXAMINATION:  Well developed, chronically ill appearing white mile in no  acute distress.  Alert nd oriented x3.  Weight is 188.2.  This is down  from 197 on March 26.  Blood pressure 132/70.  Pulse is 100.  CARDIOVASCULAR:  Regular rate and rhythm with S1 and S2.  PULMONARY:  Clear with scattered rhonchi.  No wheezes.  ABDOMEN:  Large.  Bowel sounds are active.  There is no definite  succussion splash.  No focal tenderness.  No palpable mass or  hepatosplenomegaly.  RECTAL:  Exam is Hemoccult negative.   IMPRESSION:  1. 65 year old male with multiple medical problems with      recurrent vomiting, weight loss and early satiety in a patient who      has diabetes and narcotic dependence.  Suspect he is having      problems with gastroparesis.  Also need to rule out partial gastric      outlet obstruction i.e., peptic ulcer disease.  Rule out possible      ileus or low-grade obstruction.  2. Coronary artery disease on Plavix and aspirin.  3. Adult onset diabetes mellitus.  4. 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   PLAN:  1. Check plain abdominal films today.  2. Schedule upper endoscopy with Dr. Russella Dar.  3. Patient requests switching to another proton pump inhibitor as      Nexium is quite expensive for him.  We will switch him to general      Protonix 40 mg b.i.d.  4. Trial or Reglan  10 mg 1/2 hour a.c.  I did discuss possible      neurologic side effects of this medication with the patient and his      wife, and asked him that should he experience any of this type of      symptoms, i.e., tremor, movement, involuntary movement, etc., to      discontinue      the Reglan.  5. Check CBC with differential and CMET today.      Mike Gip, PA-C  Electronically Signed      Rachael Fee, MD  Electronically Signed   AE/MedQ  DD: 12/17/2007  DT: 12/17/2007  Job #: [id number]   cc:   Venita Lick. Russella Dar, MD, [facility]

## 2011-01-04 NOTE — Cardiovascular Report (Signed)
[name], [name] NO.:  [id number]   MEDICAL RECORD NO.:  [id number]          PATIENT TYPE:  INP   LOCATION:  2013                         FACILITY:  [facility]   PHYSICIAN:  Veverly Fells. Excell Seltzer, MD  DATE OF BIRTH:  09-28-1945   DATE OF PROCEDURE:  06/05/2008  DATE OF DISCHARGE:                            CARDIAC CATHETERIZATION   PROCEDURES:  Left heart catheterization, selective coronary angiography,  and left ventricular angiography.   INDICATIONS:  Mr. Hanna is a 65 year old gentleman with CAD.  He has  had multiple stents to both the LAD and left circumflex.  He presented  with chest pain and jaw pain.  He is ruled out for myocardial  infarction, but in the setting of his typical symptoms, he was referred  for cardiac catheterization.   Risks and indications of procedure were reviewed with the patient.  Informed consent was obtained.  The right groin was prepped, draped, and  anesthetized with 1% lidocaine.  Using modified Seldinger technique, a 5-  French sheath was placed in the right femoral artery.  Standard 5-French  Judkins catheters were used for coronary angiography and left  ventriculography.  All catheter exchanges were performed over guidewire.  The patient tolerated the procedure well.  There were no immediate  complications.   FINDINGS:  Aortic pressure 142/75, with a mean of 100, left ventricular  pressure 129/24.   Coronary angiography:  Left mainstem has diffuse nonobstructive disease.  There are no significant stenoses.  The vessel bifurcates into the LAD  and left circumflex.   LAD:  There is a long stent in the proximal LAD that begins near the  ostium of the vessel.  There is a second stent in the mid-LAD.  Both  stents are widely patent with no significant restenosis.  In the  intervening segment between the two stents, there is diffuse plaque with  approximate 30-40% stenosis.  There are 3 small diagonal branches that  arise from  the proximal and mid-LAD.  None of the diagonals have  significant disease.  There is diffuse nonobstructive disease throughout  the remaining portions of the vessel, but no significant stenoses are  present.   Left circumflex:  The left circumflex has a long stent in the proximal  segment of the vessel.  There is a second stent in the distal  circumflex.  The left circumflex is dominant.  It supplies an OM-1  branch that is moderate sized.  The OM-1 arises from a segment of the  proximal stent.  There is a 30-40% stenosis in the midportion of the OM.  The distal circumflex supplies a left posterolateral branch as well as a  left PDA branch.  There are no significant stenoses in those branch  vessels.  Both stents in the left circumflex are widely patent with no  significant restenosis.   Right coronary artery:  The right coronary artery is a small and  nondominant.  There is diffuse plaque throughout.  The vessel really  does not supply any significant branches.   Left ventriculography shows low normal LV function.  The LVEF is  estimated at 50-55%.  There is no significant mitral regurgitation.   ASSESSMENT:  1. Diffuse nonobstructive coronary artery disease.  2. Widely patent stents in the left anterior descending and left      circumflex.  3. Normal left ventricular function.  4. Elevated left ventricular end-diastolic pressure.   PLAN:  Recommend efforts and ongoing medical therapy.  Suspect, the  patient's chest discomfort was noncardiac.  Continue aggressive  treatment of diabetes, hypertension, and dyslipidemia.      Veverly Fells. Excell Seltzer, MD  Electronically Signed     MDC/MEDQ  D:  06/05/2008  T:  06/05/2008  Job:  [id number]   cc:   Pricilla Riffle, MD, [facility]  Sean A. Everardo All, MD  Windle Guard, M.D.

## 2011-01-04 NOTE — Assessment & Plan Note (Signed)
OFFICE VISIT   [name], [name]  DOB:  Feb 20, 1946                                        May 27, 2010  CHART #:  [id number]   HISTORY:  The patient returns to the office today with a followup chest  x-ray in followup after right lower lobe pneumonia with empyema.  On  March 31, 2010, he underwent bronchoscopy, right video-assisted  thoracoscopy, mini thoracotomy with decortication and evacuation of  right empyema.  His only complaint at this point referable to his chest  numbness and paresthesias over the dermatomes associated with the right  thoracotomy incision.  Unfortunately, the patient is a smoker and notes  today that he is continuing to smoke.  I have reviewed this with him,  offered him referral to the smoking cessation class.  He knows that he  needs to quit smoking, but so far has declined.  He did note that he had  taken Wellbutrin with side effects before.  I again have strongly  encouraged him to stop smoking.   PHYSICAL EXAMINATION:  Today, his blood pressure 170/89, pulse is 85,  respiratory rate is 18, and O2 sats 95%.  His lungs are clear  bilaterally.  His right chest incision is well healed.  He has no rub.  Abdominal exam is benign.  Cardiac exam reveals regular rate and rhythm  without murmur or gallop.  He has no calf or leg tenderness.   MEDICATIONS:  His extensive medication list is reviewed including  OxyContin, hydrocodone, Robaxin, primidone, aspirin, Plavix, lisinopril,  Toprol-XL, Maxzide, nitroglycerin, Xalatan eye drops, Optivar eye drops,  Nexium, Cymbalta, Lunesta, Zocor, potassium, Lasix, metformin,  Neurontin, cimetidine, promethazine.   DIAGNOSTIC TESTS:  Followup chest x-ray shows complete resolution of the  previous empyema compared to a film 1 month ago.  It continues to  improve.   IMPRESSION:  At this point, it is noted I have again stressed to the  patient the importance to stop smoking in addition with  his history of  empyema and smoking history.  I have encouraged  him to make sure he gets a flu vaccination and to talk to Dr. Windle Guard, his primary care doctor about pneumococcal vaccination.   Sheliah Plane, MD  Electronically Signed   EG/MEDQ  D:  05/27/2010  T:  05/27/2010  Job:  [id number]   cc:   Windle Guard, M.D.  Pricilla Riffle, MD, [facility]

## 2011-01-07 NOTE — Op Note (Signed)
NAME:  [name], [name]                        ACCOUNT NO.:  [id number]   MEDICAL RECORD NO.:  [id number]                   PATIENT TYPE:  INP   LOCATION:  5016                                 FACILITY:  [facility]   PHYSICIAN:  [name], M.D.         DATE OF BIRTH:  [birth year]   DATE OF PROCEDURE:  07/04/2002  DATE OF DISCHARGE:                                 OPERATIVE REPORT   PREOPERATIVE DIAGNOSES:  Transverse open reduction internal fixation right  radius fracture proximal in nature with osteomyelitis noted, now at the 6-  1/2 week postop mark.   POSTOPERATIVE DIAGNOSES:  Transverse open reduction internal fixation right  radius fracture proximal in nature with osteomyelitis noted, now at the 6-  1/2 week postop mark.   OPERATION PERFORMED:  1. Incision and drainage skin, subcutaneous tissues, muscle and tendon     tissues as well as bone with removal of infected bony areas secondary to     osteomyelitis.  2. Removal of deep hardware, right forearm.  3. Stress radiography right forearm.   SURGEON:  Dionne Ano. Amanda Pea, M.D.   ASSISTANT:  Karie Chimera, P.A.-C.   COMPLICATIONS:  None.   ANESTHESIA:  LMA general.   TOURNIQUET TIME:  Less than two hours.   ESTIMATED BLOOD LOSS:  Less than 50 cc.   DRAINS:  One.   CULTURES:  Multiple tissue, bone and abscess cultures were taken.   INDICATIONS FOR PROCEDURE:  The patient is a 65 year old male who presents  with the above mentioned diagnosis.  The patient unfortunately has had  multiple injuries secondary to an injury including cervical fracture and  right radius fracture.  The radius fracture was treated after a period of  observation secondary to skin trauma with ORIF.  He was doing quite well  until four to five days ago and he began having redness and swelling and  pain of the forearm.  Since that time, the pain has increased.  I saw him  and immediately admitted him to the hospital with the working  diagnosis of  osteomyelitis given his plain radiograph changes.  He has two out of six  screws that appear loose.  He has periosteal elevation, has an increased C-  reactive protein.  He has had no fevers, his white count has remained within  the acceptable range.  I have counseled him in regard to my findings.  I  feel he has osteomyelitis based upon his objective examination.  I feel that  further diagnostic studies would be of little help and would only delay  definitive treatment.  I have discussed with him definitive treatment in the  form of incision and drainage and removal of infected tissue.  I discussed  with him possible scenarios of retaining the hardware if the bone is not  healed versus placing the arm in a splint for protection after hardware  removal.  I discussed with him possible multiple  washouts with antibiotic  bead placement, possible need for amputation, further reconstruction efforts  in the form of bone graft and light reconstruction and long term antibiotic  use, etc.  I have also discussed with him possible risks of late sinus tract  infection and chronic osteomyelitis that is difficult to heal.  He  understands the risks and benefits of surgery including the risks of  worsening, infection, bleeding, anesthesia, damage to normal structures and  failure of surgery to accomplish its intended goals of intended surgery.  He  has had a preoperative ultrasound which showed no abscess.  I evaluated him  thoroughly and with his consent will proceed accordingly.   OPERATIVE FINDINGS:  The patient had osteomyelitis of the radius proximally.  His bony fracture was stable, I removed the plate, curettaged the area  meticulously to remove any nonviable remnants.  All bony ends were nicely  bleeding and without avascularity.  This was noted at the conclusion of the  case.  He underwent hardware removal as his fracture site appeared to be  stable.  He tolerated the procedure  well without complications.   DESCRIPTION OF PROCEDURE:  The patient was seen and identified by myself and  anesthesia.  Dr. Michelle Piper placed him under general LMA anesthetic; he tolerated  this well.  He was laid supine and totally prepared, prepped and draped in  the usual sterile fashion about the right upper extremity.  He was  previously on Ancef antibiotic.  Once this was done, the arm was elevated  and the tourniquet was insufflated to 250 mmHg after a sterile prep and  drape with Betadine scrub and paint occurred.  Once this was done, incision  was made in the upper extremity under tourniquet  control over the proximal  and mid portions of the forearm along previously made incisions.  Dissection  was carried down to the skin with a knife blade.  Following this previous  thickened tissue planes were identified.  There was a robust amount of  scarring and I immediately encountered purulence.  I  cultured this with  plain culture, aerobic and anaerobic.  Following this, dissection deepened  and there was a tract that went immediately down to the plate in the mid  portion.  I traced this down with fingertip glove and then opened the plane  bluntly with my scissor tip and hemostats.  The previous interval of Sherilyn Cooter  was opened up.  Following opening this, I dissected the superficial radial  nerve out which was encased in scar and I took care to preserve the radial  artery and prevent iatrogenic injury.  This was noted to be the case.  Following this, I then I&D'd the skin, subcu, tendon and muscle.  Once this  was done, tissue cultures were taken.  Following this, the plate was  identified.  I then removed two loose screw holes, the second and third to  the most distal screws were loose and I removed them.  Following this, I  placed a drill in these holes and curetted them.  I was quite clear that  these holes had been increased in their size with the infectious process. The fracture site  looked fairly stable.  I did probe this and I curetted  this as well as the periosteum.  I then took multiple bony cultures.  I then  loosened the plate and stress tested the bone.  The bone appeared to be  stable and thus with it appearing stable, I  then removed the entire plate  and irrigated as well as debrided the wound meticulously.  This was done  with curet and other surgical instruments.  I removed all of the nonviable  tissue and following removal of this tissue, then irrigated the wound  copiously.  I made Tobramycin beads with 2.4 mg Tobramycin and Palacos  cement with beads.  There was a 4 made (four beads that were made on a 20  gauge wire).  Following making this, I then irrigated the wound with greater  than 10L of saline.  As I mentioned cultures, multiple were taken of the  bone including three to four cultures of aerobic and anaerobic means.  Thus  with cultures taken and the antibiotic beads premade on the operative suite,  I then irrigated the area with 10L of saline.  Following this, antibiotic  beads were placed.  The patient underwent stress fluoroscopy which looked  excellent without signs of instability and I then closed the wound loosely  over a drain.  Thus the patient underwent I&D of osteomyelitis including  removal of bone.  He also underwent plate removal, stress radiography and  implantation/placement of antibiotic beads in the form of Tobramycin.  He  tolerated this well without difficulty and there were no complications.  Bleeding was without excess.  He was loosely closed and then dressed  sterilely in a long arm splint without ability for the forearm to rotate.  The patient tolerated the procedure well.  He was extubated and transferred  to the recovery room in stable condition.  We will plan for a repeat washout  at 48 and 72 hours, monitor his condition closely and make sure things go  according to plan.  I have discussed all issues with his family.  His  bone  was stable; however, there was a large defect where the two next to the most  distal holes were present.  We will need to watch this for any fracture or  stress fracture that may occur.  He understands this as I have discussed  this with the family at length.  He has a very difficult problem.  We will  try our best to eradicate this.  This will require multiple wash outs and an  aggressive approach in my opinion.  He understands this as I have discussed  this with him at length.  We will plan to continue his treatment  accordingly.  He is stable at this time and understands the  proposed outlined and treatment algorithm.  All questions have been  encouraged and answered.  He had positive osteomyelitis with abscess and  will require an aggressive approach to treatment.  Postoperative x-rays were  taken to document the position of the bone and the stability of the fracture  at the present time.                                                [name], M.D.    Nash Mantis  D:  07/04/2002  T:  07/05/2002  Job:  [id number]   cc:   Henry A. Pool, M.D.  [address]  [place]  [place] [address]  Fax: [phone]   Rockey Situ. Flavia Shipper., M.D.  [address]  [place]  [place] [address]  Fax: [phone]

## 2011-01-07 NOTE — H&P (Signed)
[name], [name] NO.:  [id number]   MEDICAL RECORD NO.:  [id number]          PATIENT TYPE:  INP   LOCATION:  1824                         FACILITY:  [facility]   PHYSICIAN:  Charlies Constable, M.D. [facility] DATE OF BIRTH:  July 26, 1946   DATE OF ADMISSION:  10/29/2004  DATE OF DISCHARGE:                                HISTORY & PHYSICAL   PHYSICIANS:  Primary care physician:  Dr. Jeannetta Nap at [facility].  Cardiologist:  Pricilla Riffle, M.D.   CHIEF COMPLAINT:  Chest pain.   REFERRING PHYSICIAN:  [facility].   CLINICAL HISTORY:  [name] is 65 years old and has documented coronary  disease.  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  In August, he  was studied and had a Cypher stent placed in the mid left anterior  descending artery by myself.  He has had intermittent chest pain since that  time.  This morning at about 8 a.m., he developed substernal chest  discomfort relieved with nitroglycerin, and it recurred.  He took another  nitroglycerin with relief.  He had one more episode in the midday for which  he took nitroglycerin and had relief.  He called the office, and they  advised him to come into the emergency room.  He is not having any further  pain since he came to the emergency department.   PAST MEDICAL HISTORY:  1.  Hypertension.  2.  Hyperlipidemia.  3.  Esophageal spasm.  4.  Injuries from a fall back in 2003 to his back.  5.  He also has an anxiety disorder.   CURRENT MEDICATIONS:  Oxycodone, Neurontin, Robaxin, Valium, Toprol,  aspirin, Plavix, Lisinopril, Lipitor, Maxzide, Trazodone, nitroglycerin,  Percocet, Optivar, Xalatan.   For details of Social History, Family History, Review of Systems, please see  the complete note by Theodore Demark, P.A.   PHYSICAL EXAMINATION:  VITAL SIGNS:  Blood pressure 127/74, pulse 58 and  regular.  NECK:   There was no venous distention.  The carotids were full without  bruits.  CHEST:  Clear.  CARDIAC:  Rhythm was regular, could hear no murmurs or gallops.  ABDOMEN:  Soft with normal bowel sounds.  There was no hepatosplenomegaly or  pulsatile masses.  EXTREMITIES:  Peripheral pulses are 4 with no peripheral edema.  MUSCULOSKELETAL:  No deformities.  SKIN:  Warm and dry.  NEUROLOGIC:  No focal neurological signs.   LABORATORY DATA AND OTHER STUDIES:  ECG showed minor nonspecific ST-T  changes and inferior Q waves indicative of an old inferior infarction.  His  last ECG showed inferior Q waves which were not quite so pronounced.   IMPRESSION:  1.  Chest pain characteristic of recurrent angina.  2.  Coronary artery disease status post prior diaphragmatic myocardial      infarction treated with stents to the right coronary artery, left      anterior descending, and subsequent stenting of the mid left anterior      descending.  3.  Good  left ventricular function.  4.  Hypertension.  5.  Hyperlipidemia.  6.  Disability secondary to falls and back injuries.  7.  Anxiety.   RECOMMENDATIONS:  I recommended the patient be admitted for further  evaluation, but the patient declined.  Will plan to put the patient on Imdur  and arrange for him to have a Cardiolite scan early next week and followup  visit with Dr. Tenny Craw.  I called [name] and left a message for her and  gave the patient [name]'s number to call on Monday.  If the patient  should have any recurrent symptoms, he knows to return to the emergency room  for further evaluation and treatment.      BB/MEDQ  D:  10/29/2004  T:  10/29/2004  Job:  [id number]   cc:   Dr. Jeannetta Nap, [facility]   Pricilla Riffle, M.D.

## 2011-01-07 NOTE — Cardiovascular Report (Signed)
NAME:  CHRISHAUN, SASSO                        ACCOUNT NO.:  [id number]   MEDICAL RECORD NO.:  [id number]                   PATIENT TYPE:  INP   LOCATION:  2912                                 FACILITY:  [facility]   PHYSICIAN:  Charlies Constable, M.D. [facility]              DATE OF BIRTH:  [birth year]   DATE OF PROCEDURE:  03/23/2004  DATE OF DISCHARGE:                              CARDIAC CATHETERIZATION   CLINICAL HISTORY:  Mr. Vandegrift is 65 years old and had recent stent placed  in the proximal LAD and a dominant circumflex artery at [facility]  approximately four months ago.  He was admitted with recurrent chest pain  and was studied earlier today by Dr. Antoine Poche.  Dr. Antoine Poche found the  stents were patent but he had an 80% lesion in the mid LAD and he had about  a 70-80% ostial lesion in a posterolateral branch of the circumflex artery.  We made a decision to treat the lesion in the mid LAD although we are not  100% sure this was the culprit.   PROCEDURE:  The procedure was performed from the right femoral arterial  sheath and a Q46 [place] guiding catheter with sideholes.  The patient was  given Angiomax bolus and infusion and had previously been on Plavix.  We  used an [employer] soft wire and crossed the lesion in the mid LAD with the wire  without difficulty.  We predilated with a 2.25 by 12 mm Quantum Maverick  performing one inflation up to 10 atmospheres for 30 seconds.  We then  deployed a 2.5 by 13 mm Cypher stent deploying this with one inflation of 10  atmospheres of 13 seconds.  We then post dilated with a 2.5 by 8 mm  Powersail performing two inflations up to 15 atmospheres for 30 seconds  avoiding the distal edge.  Repeat diagnostic study was then performed  through the guiding catheter.  The patient tolerated the procedure well and  left the laboratory in satisfactory condition.   RESULTS:  Initially, stenosis in the mid LAD was estimated at 80%.  Following stenting, this  appeared to improve to 0%.  There was slight step  down both proximally and distally.   CONCLUSION:  Successful stenting of the lesion in the mid left anterior  descending artery with a Cypher stent with improvement in the narrowing from  80% to 0%.   DISPOSITION:  The patient returned to the unit for further observation.                                               Charlies Constable, M.D. [facility]    BB/MEDQ  D:  03/23/2004  T:  03/23/2004  Job:  [id number]   cc:   Windle Guard, M.D.  [address]  [place],  [place] [address]  Fax: [phone]   Pricilla Riffle, M.D.   Rollene Rotunda, M.D.

## 2011-01-07 NOTE — Discharge Summary (Signed)
NAME:  [name], [name]                        ACCOUNT NO.:  [id number]   MEDICAL RECORD NO.:  [id number]                   PATIENT TYPE:  INP   LOCATION:  5016                                 FACILITY:  [facility]   PHYSICIAN:  Dionne Ano. Amanda Pea, M.D.             DATE OF BIRTH:  09/10/1945   DATE OF ADMISSION:  07/02/2002  DATE OF DISCHARGE:  07/11/2002                                 DISCHARGE SUMMARY   ADMISSION DIAGNOSES:  1. Cellulitis/osteomyelitis right upper extremity status post open reduction     and internal fixation.  2. Hypertension.  3. Status post cervical injury C2 pedicle fracture.  4. Status post T12 compression fracture.  5. Status post small ASAH.   DISCHARGE DIAGNOSES:  1. Cellulitis/osteomyelitis right upper extremity status post open reduction     and internal fixation.  2. Hypertension.  3. Status post cervical injury C2 pedicle fracture.  4. Status post T12 compression fracture.  5. Status post small ASAH.  6. Improved.   PROCEDURE:  I&D of the right upper extremity x3 including removal of  hardware and antibiotic bead placement.   SURGEON:  Dionne Ano. Amanda Pea, M.D.   CONSULTATIONS:  Infectious disease.   HISTORY OF PRESENT ILLNESS:  The patient is a 65 year old gentleman who  after falling from a tree, sustained multiple injuries and was subsequently  treated with an ORIF to the right wrist secondary to a radius fracture and  was seen and evaluated on July 01, 2002.  The patient was status post  six weeks three days at the time of the office visit and presented to the  clinic with pain, swelling and redness noted about the forearm for the past  three days.  On evaluation, per Dr. Amanda Pea, he was noted to have a  cellulitis about the right upper extremity.  Radiographs were obtained and  showed evidence of lucency about the screw holes #2 and #6 as well as areas  of periosteal changes consistent with osteomyelitis.  The decision was made  to admit  the patient for IV antibiotics, infectious disease consult,  irrigation and debridements as needed with partial versus complete removal  of the hardware.   HOSPITAL COURSE:  The patient was admitted on July 02, 2002, for right  forearm cellulitis as well as osteomyelitis status post ORIF.  Infectious  disease consult was immediately sought for concurrent monitoring of the  patient.  The patient was admitted and started on empiric antibiotics.  We  discovered the cellulitis as well as osteomyelitis.  This was in the form of  Ancef.  He was also started on pain management, elevation, and wound care.  The patient had a lengthy hospital stay and underwent a series of three  irrigation and debridements as well as complete removal of the hardware with  antibiotic bead placement.  Intraoperative cultures were obtained and did  show serratia marcescens which was sensitive to oral quinolone.  Per  recommendation of infectious disease the patient was switched to IV Cipro to  which he tolerated well.  The patient's condition continued to improve  throughout his hospital stay.  The patient continued IV Cipro for a period  of six days.  Once his condition was stable and he was afebrile, the  decision was made to discharge him home on p.o. antibiotics for a period of  a week, per infectious disease recommendations.   On July 11, 2002, the day of discharge, the patient was at his bedside,  overall stating that the arm felt much better, still having some numbness at  the tips of the finger.  However, his pain was well controlled.  He did  state he was not resting at night.  His vital signs were stable.  He was  afebrile.  The bone smear again showed the prementioned pathogen.  His right  upper extremity showed the splint was clean, dry and intact.  He had mild  edema about the dorsum of the hand.  His digits were without edema.  Neurovascularly he was intact.  Flexion and extension was intact.   He had  good capillary refill with no signs of compartment syndrome noted.  Again,  due to his improved state, the decision was made to discharge him home.   FINAL DIAGNOSIS:  Status post I&D of the right upper extremity x3 with  complete removal of the hardware secondary to osteomyelitis of the right  forearm.   CONDITION ON DISCHARGE:  Improved.   DIET:  Regular.   ACTIVITY:  Keep splint clean, dry and intact.  Elevate right upper extremity  above the level of the heart.  Move fingers frequently.   DISCHARGE MEDICATIONS:  1. Percocet 7.5 mg.  2. Robaxin 500 mg.  3. Cipro 750 mg on p.o. b.i.d. for a total of eight weeks.  4. Mycelex dissolvable tablets secondary to oral candidiasis after prolonged     antibiotics use.  5. Ambien 10 mg.  6. Multivitamin over-the-counter each day.   FOLLOW UP:  This will be with Dr. Amanda Pea in one week from discharge on  Wednesday, July 17, 2002, at 10:30 a.m.   We greatly appreciate infectious disease diligent monitoring and help with  this kind gentleman throughout his hospital stay.  All questions were  encouraged and answered on the day of discharge with over a 30-minute  discussion with the patient regarding any issues.     Karie Chimera, P.A.-C.                   Dionne Ano. Amanda Pea, M.D.    BB/MEDQ  D:  10/23/2002  T:  10/24/2002  Job:  [id number]

## 2011-01-07 NOTE — Op Note (Signed)
NAME:  CAMDAN, BURDI                        ACCOUNT NO.:  [id number]   MEDICAL RECORD NO.:  [id number]                   PATIENT TYPE:  INP   LOCATION:  5016                                 FACILITY:  [facility]   PHYSICIAN:  [name], M.D.         DATE OF BIRTH:  04/01/1946   DATE OF PROCEDURE:  07/06/2002  DATE OF DISCHARGE:                                 OPERATIVE REPORT   PREOPERATIVE DIAGNOSIS:  Osteomyelitis, right radius, status post irrigation  and debridement 36-48 hours ago, planned for repeat irrigation and  debridement.   POSTOPERATIVE DIAGNOSIS:  Osteomyelitis, right radius, status post  irrigation and debridement 36-48 hours ago, planned for repeat irrigation  and debridement.   PROCEDURES:  Irrigation and debridement, skin, subcutaneous tissue, tendon,  and muscle tissue as well as bony debridement secondary to osteomyelitis,  right forearm.   SURGEON:  Dionne Ano. Amanda Pea, M.D.   ASSISTANT:  Karie Chimera, P.A.-C.   COMPLICATIONS:  None.   ANESTHESIA:  LMA general.   TOURNIQUET TIME:  None.   ESTIMATED BLOOD LOSS:  Minimal.   DRAINS:  None.   CULTURES:  Cultures x1 taken.   INDICATION FOR PROCEDURE:  This patient is a very pleasant 65 year old male  whose history has been dictated at length.  He presents for a second I&D.   OPERATIVE FINDINGS:  The patient had much improved wound conditions.  All  tissue appeared healthy and bleeding.  We irrigated the wound with greater  than 9 L of saline, and portions of this were antibiotic saline.  I  performed an aggressive debridement.  There was no reaccumulation of  purulence.  Cultures were taken prior to the I&D, of course.   DESCRIPTION OF PROCEDURE:  The patient was seen by myself and anesthesia,  taken to the operative suite, and underwent the smooth induction of LMA  anesthetic.  Once this was done, he was placed supine, appropriately padded,  and prepped and draped in the usual sterile  fashion with myself holding the  arm.  Once this was done and the sterile field was secured, we opened the  previous wound up, which was loosely closed.  We immediately encountered  some serous fluid but no accumulation of the purulence that was previously  present.  I dissected down to the bone and in doing so removed portions of  the skin, subcu, and tendinous as well as muscular tissue that had any  prenecrotic areas visible.  This was an excisional debridement.  Following  this the patient underwent I&D of the bone.  This was I&D of osteomyelitis.  Once this was done, the patient underwent irrigation with greater than 9 L  of saline, 6 L of which were pulsatile irrigation.  He tolerated this well,  the bone bled nicely, and I was very pleased with the intraoperative  conditions.  I did remove antibiotic beads on the initial dissection.  Cultures were taken prior  to the irrigation.  Once the I&D was complete, the  patient had good hemostasis, all areas looked very viable and healthy, and I  closed the wound loosely over two drains.  He was placed in a posterior  plaster splint without difficulty.  There were no complications.   PLAN:  We are going to have the patient continue on IV antibiotics.  We are  awaiting final cultures.  We will plan antibiotic-specific administration  once the cultures are definite.  I am going to plan a third washout with  probable closure over a drain in 36-48 hours.  I have gone over with him all  issues, and all questions have been encouraged and answered.  He is stable  in the recovery room, and there were no complications.                                               [name], M.D.    Nash Mantis  D:  07/06/2002  T:  07/06/2002  Job:  [id number]

## 2011-01-07 NOTE — Op Note (Signed)
NAME:  [name], [name]                        ACCOUNT NO.:  [id number]   MEDICAL RECORD NO.:  [id number]                   PATIENT TYPE:  OIB   LOCATION:  2550                                 FACILITY:  [facility]   PHYSICIAN:  Kerrin Champagne, M.D.                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   OPERATION PERFORMED:  Right C6-7 [place] foraminotomy with decompression of  the right C7 nerve root.   SURGEON:  Kerrin Champagne, M.D.   ASSISTANT:  Wende Neighbors, P.A.   ANESTHESIA:  GOT.  Dr. Smith/Dr. Sampson Goon.   ESTIMATED BLOOD LOSS:  75 mL.   DRAINS:  None.   INDICATIONS FOR PROCEDURE:  The patient is a 65 year old male who has  undergone previous right carpal tunnel release.  He has had persistent pain  with radiation into his right upper extremity.  He underwent further  evaluation including MRI study which demonstrated spondylosis changes  involving the cervical spine, primarily affecting the left side.  The  patient underwent further study including myelogram and postmyelogram CT  scan.  This demonstrated an old healed odontoid fracture, neural foraminal  narrowing at C3-4, C5-6 and C6-7.  The right side C6-7 demonstrated  significant foraminal stenosis.  The patient underwent selective nerve root  block and this appeared to relieve his pain.  The patient was brought to the  operating room to undergo right C6-7 foraminal decompression.  As the  problem appears to be primarily uncovertebral spondylosis changes with small  disk protrusion.   INTRAOPERATIVE FINDINGS:  The patient was found to have spondylosis  involving the uncovertebral joint at the C6-7 level.  This was entrapping  the  right C7 nerve root.   DESCRIPTION OF PROCEDURE:  After adequate general anesthesia, the patient  transferred to the operating room table, prone position, chest rolls, pads  beneath the knees, thighs and calves.  Ace wraps wrapping the lower  extremities to prevent DVT.  The patient's arms at his sides well padded and  wrapped with sheets circumferentially.  The neck in slight flexion about 15  to 20 degrees.  The head held on the well-padded [place] horseshoe.  Care  taken to ensure the chin was not being pressed.  This was padded as well.  Tape was used to hold the shoulders posteriorly and the retracted.  Standard  prep with DuraPrep solution over the posterior aspect of the neck.  The  patient was in slight reverse Trendelenburg to decrease venous distention in  the neck.  Draped in the usual manner following prep with DuraPrep solution  over the posterior neck extending from C3  to T1 or T2.  An iodine Vi-drape  was used.  The incision at the expected C7-C6 level as palpated with  immediate drop off was present from C7 down to C6.  This done, an incision  approximately an inch and a half to two inches in length.  After  infiltration with Marcaine 0.5% with 1:200,000 epinephrine approximately 7  to 8 mL.  Incision through skin and subcu layers using a 10 blade scalpel.  Electrocautery used to divide more subcutaneous layers down to the spinous  process of C7, C6 in the midline.  Then incised along the right side of the  spinous process of C7 and C6.  Cobbs used to elevate the erector spinae  muscle off the posterior aspect of the spinous process and lamina of C7 on  the right side and C6.  A curved Mayo scissor then used to remove the  attachment of the erector spinae muscle off the posterior and inferior  aspect of C6 lamina, exposing the C6-7 level.  Clamps placed on the spinous  process of C6 and C7.  Intraoperative C-arm fluoroscopy used to ascertain  the correct level.  The upper  clamp being at the C6, lower clamp at the C7  spinous process.  These areas then marked with a single Vicryl stitch for  continued identification throughout the remainder of the procedure.  A  McCullough retractor then inserted, laid beneath the erector muscles  allowing for exposure out to the lateral aspect of the facet at the C6-7  level.  Exposing nicely the medial portion of the facet and the interlaminar  space posteriorly.  High speed bur then used to remove and decorticate  superficially the inferior aspect of the lamina of C6 and along the medial  aspect of the facet of C6 inferiorly.  This was done removing it down to the  superior articular process of C7.  The superior articular process as well as  the superior border of the C7 lamina were also thinned using a 4 mm high  speed bur.  A 1 mm Kerrison could then be introduced beneath the lamina of  C6 resecting further and then resecting the medial portion of the facet, the  superior portion of the superior articular process of C7 performing  foraminotomy over the C7 nerve root.  This was continued down distally or  caudally, resecting a small portion of the superior margin of the lamina  laterally of C7.  This was carried down to the level of the pedicle.  The  pedicle was partially resected over its superomedial aspect for further  exposure of the C7 nerve root.  Titanium nerve hook then used to carefully  lift the ligamentum flavum then bipolar electrocautery used to cauterize  epidural veins, then carefully entering the interval between the epidural  veins and the thecal sac posteriorly.  This then developed out laterally to  the level of neural foramen releasing the neurovascular leash holding the C7  nerve root forward against the uncovertebral spurs present.   The axillary portion of the nerve root was then carefully developed, epidural veins cauterized using bipolar electrocautery.  Nerve hook then  used to carefully lift  the C7 nerve root superiorly exposing the posterior  aspect of the disk space at C6-7.  An 11 blade scalpel used to carefully  excise the posterior aspect of the disk.  It was noted with the scalpel,  though, that there was no disk material present and this area represented  uncovertebral spur extending into the axillary portion of the C7 nerve root.  Spur appeared to be mild in its degree of size.  The large nerve hook could  be passed out the neural foramen easily.  The C7 nerve root appeared to be  well mobilized.  There did not appear to be any significant disk herniation  material that required removal.  With this then, careful hemostasis  obtained, using Gelfoam and cottonoids.  Bone wax applied to bleeding  cancellous bone surfaces.  Following obtaining excellent hemostasis the  ligamentum nuchae was then reattached in the midline with interrupted 0  Ethibond sutures, deep subcutaneous layers approximated with interrupted 2-0  Vicryl sutures.  More superficial layers with interrupted 2-0 Vicryl sutures  and the skin closed with inverted simple sutures of 4-0 Vicryl.  Tincture of  benzoin and Steri-Strips were applied, 4 x 4s affixed to the skin with  HypaFix tape.  A soft collar applied.  The patient was reactivated following  return to supine position, extubated and returned to recovery room in  satisfactory condition.  Sponge and instrument counts were correct.                                               Kerrin Champagne, M.D.    Myra Rude  D:  10/21/2003  T:  10/21/2003  Job:  [id number]

## 2011-01-07 NOTE — Cardiovascular Report (Signed)
NAME:  [name], [name]                        ACCOUNT NO.:  [id number]   MEDICAL RECORD NO.:  [id number]                   PATIENT TYPE:  INP   LOCATION:  2912                                 FACILITY:  [facility]   PHYSICIAN:  Rollene Rotunda, M.D.                DATE OF BIRTH:  25-Aug-1945   DATE OF PROCEDURE:  03/23/2004  DATE OF DISCHARGE:                              CARDIAC CATHETERIZATION   PRIMARY CARE PHYSICIAN:  Windle Guard, M.D.   PROCEDURE:  Left heart catheterization/coronary arteriography.   INDICATIONS:  Evaluate a patient with unstable angina and previous stenting  in June 2005 of a circumflex lesion.  In July he had stenting of an LAD  lesion; this was done at [facility].  The circumflex was apparently a Taxus  stent and the LAD a  Cypher stent.  The patient presented with similar  symptoms to his previous.   DESCRIPTION OF PROCEDURE:  Left heart catheterization was performed via the  right femoral artery.  The artery was cannulated using an anterior puncture.  A 6-French arterial sheath was inserted via the modified Seldinger  technique.  Preformed Judkins and pigtail catheter were utilized.  The  patient tolerated the procedure.   RESULTS:   HEMODYNAMICS:  1. LEFT VENTRICULAR:  138/16.  2. AORTIC:  143/80.   CORONARIES:  1. LEFT MAIN:  Short and normal.  2. LEFT ANTERIOR DESCENDING ARTERY:  Had proximal stent with diffuse luminal     irregularities.  There was mid and distal LAD with diffuse 25% lesions.     The mid LAD had a focal 80% stenosis.  There was a small mid-diagonal     which was free of high-grade disease.  3. CIRCUMFLEX CORONARY ARTERY:  Had a prophylaxis 80% stenosis.  There was a     widely patent proximal stent. There was a large mid marginal with     proximal and mid 30% lesions.  There was a posterolateral which was large     and had ostial 80% stenosis.  4. RIGHT CORONARY ARTERY:  Nondominant, with diffuse 25-30% lesions.   LEFT  VENTRICULOGRAM:  Obtained in the RAO projection.  The EF is 65% with  normal wall motion.   CONCLUSION:  High-grade LAD and circumflex disease.  Patent stents.  It is  unclear which vessel is the culprit.  The ostial circumflex would be  difficult to treat, with a high chance of restenosis.  We will attempt  percutaneous revascularization of the LAD, as it appears that it could be a  likely culprit.  It is in the position that is more amenable to  intervention.  The patient could be screened going for this __________  perfusion study, particularly if he has any recurrent symptoms.  He will  have aggressive secondary risk reductions.  The case has been reviewed with  Dr. Juanda Chance.  Rollene Rotunda, M.D.   Derinda Sis  D:  03/23/2004  T:  03/23/2004  Job:  [id number]

## 2011-01-07 NOTE — Assessment & Plan Note (Signed)
[name]                              CARDIOLOGY OFFICE NOTE   NAME:[name], [name]                     MRN:          [id number]  DATE:05/22/2006                            DOB:          06-14-1946    IDENTIFICATION:  [name] is a 65 year old with history of CAD.  I last  saw him back in early September.   When I saw him he was markedly swollen, blood work was really unremarkable.  He had been seen by Dr. Jeannetta Nap who recommended stopping the Caduet and take  Lipitor with follow up at a clinic.   When I saw him last I also added Lasix with potassium to take, which he is  doing.  He says his weight is down some, he thinks the swelling is less but  not back to normal.   CURRENT MEDICATIONS:  Did not bring today.  1. OxyContin 20 q.8.  2. Robaxin 750 q.8.  3. Lyrica 75 b.i.d.  4. Diazepam two t.i.d.  5. Aspirin 81 mg daily.  6. Lipitor 40 daily.  7. Lisinopril 20 daily.  8. Toprol-XL __________ daily.  9. Maxzide 25 daily.  10.Zyrtec 10 daily.  11.Xalatan eye drops.  12.Optivar eye drops.  13.Nexium b.i.d.  14.Cymbalta 90 t.i.d.  15.Lasix 40 daily p.r.n.  16.Potassium 20 mEq daily p.r.n.  17.Lunesta q.h.s. p.r.n.   PHYSICAL EXAM:  The patient is in no distress.  Blood pressure is 124/83,  pulse is 75, weight 198, down from last visit when it was 206.  NECK:  The JVP approximately 9.  LUNGS:  Occasional rhonchi (patient recovering from URI).  CARDIAC EXAM:  Regular rate and rhythm, S1, S2.  No S3, no significant  murmurs.  ABDOMEN:  Distended but not taut like he was.  EXTREMITIES:  Edema 1+, improved from previous.   Echocardiogram done September 18th showed normal LV, RV function.  Trivial  pericardial effusion.   IMPRESSION:  1. [name] has been a bit of a mystery.  He may indeed have gotten      volume overloaded with swelling from the Norvasc.  He is off it now and      improved some with Lasix.  I would like to check  some electrolytes      today.  Will also check LFTs since they were mildly elevated, now that      he is off the Lipitor.  I would like to have him continue on his      regimen and I will follow him up in a couple of months' time to see how      he is doing.  2. Coronary artery disease status post percutaneous transluminal coronary      angioplasty stent to circumflex and left anterior descending back in      2005.  No significant change clinically.  Would follow.  3. Dyslipidemia.  Will write for Lipitor for him to take and again will      follow up LFTs.  4. History of syncope, remote, felt secondary to hypervagal response.   Follow up as  noted.  Call for problems.  We will be in touch with him  regarding his labs.            ______________________________  Pricilla Riffle, MD, [facility]     PVR/MedQ  DD:  05/22/2006  DT:  05/23/2006  Job #:  [id number]   cc:   Windle Guard, M.D.

## 2011-01-07 NOTE — Op Note (Signed)
NAME:  [name], [name]                        ACCOUNT NO.:  [id number]   MEDICAL RECORD NO.:  [id number]                   PATIENT TYPE:  INP   LOCATION:  5016                                 FACILITY:  [facility]   PHYSICIAN:  [name], M.D.         DATE OF BIRTH:  [birth year]   DATE OF PROCEDURE:  07/08/2002  DATE OF DISCHARGE:                                 OPERATIVE REPORT   PREOPERATIVE DIAGNOSIS:  Osteomyelitis of right forearm, status post two  incision and drainage procedures, presents for incision and drainage and  closure.   POSTOPERATIVE DIAGNOSIS:  Osteomyelitis of right forearm, status post two  incision and drainage procedures, presents for incision and drainage and  closure.   OPERATION PERFORMED:  Incision and drainage of skin and subcutaneous  tissues, muscle, tendon and bony tissue secondary to osteomyelitis and  subsequent wound closure over a drain.   SURGEON:  Dionne Ano. Amanda Pea, M.D.   ASSISTANT:  Marshia Ly, P.A.   ANESTHESIA:  LMA general.   TOURNIQUET TIME:  0.   ESTIMATED BLOOD LOSS:  Less than 50 cc.   COMPLICATIONS:  None.   CULTURES:  Two taken, aerobic and anaerobic.   INDICATIONS FOR PROCEDURE:  The patient is a very pleasant 65 year old male  who presents with the above mentioned diagnosis.  I have counseled him in  regard to the risks and benefits of surgery including the risks of  infection, bleeding, anesthesia, damage to normal structures, and failure of  surgery to accomplish intended goals of relieving symptoms and restoring  function.  He is currently growing Serratia as his bacterial species and is  on specific Cipro for this.  He has looked excellent on his incision and  drainage 36 to 48 hours ago and now presents for incision and drainage and  wound closure if conditions look good.   DESCRIPTION OF PROCEDURE:  The patient was seen by myself and anesthesia,  taken to the operative suite and underwent an LMA general  anesthetic under  the direction of Dr. Arta Bruce.  Following this, he was laid supine.  I  then held the arm and performed the prep and drape with the help of nursing  staff.  Following this, sterile field was secured.  The patient then  underwent incision and drainage which was done by removing the previous  sutures.  I then I&D'd skin, subcutaneous tissue, muscle and tendon tissue.  I took a 2 mm skin ellipse sharply with knife blade.  Following this, we  curetted the bone once again.  All conditions looked excellent.  Then we  took cultures aerobic and anaerobic without difficulty and then proceeded  with further incision and drainage with 7L of fluid including last liter of  antibiotic irrigant.  The patient looked excellent had punctate bleeding  bone, no signs of necrosis in the wound or other problems.  I was very  pleased with the tissue conditions.  The bone bled well.  There were no  areas of necrotic bone and no areas of reaccumulation of purulence or  devitalized tissue.  The tissue all looked good including skin and  subcutaneous tissues, muscle and tendinous tissue.  All areas bleed well and  looked quite excellent.  Following this, I placed a large drain in the wound  in the form of a Jackson-Pratt drain and following this, I closed the wound  with interrupted 3-0 Prolene in the skin edge.  I did not perform any deep  closure.  The patient did not have any significant appreciable dead space in  the depths of the wound.  He had excellent pulse, good refill and tolerated  the procedure well.  I sterilely dressed him and then placed him in a long  arm plaster splint.  He was transferred to the recovery room in stable  condition.  He will be monitored closely and we will plan to see him while  in the hospital and continue aggressive management.  We are planning this  procedure today as his last wash out as everything has looked excellent over  the last two wash outs.  Wound  conditions are completely favorable.  We  will, of course, continue him on antibiotics aggressively.  He remains  neurovascularly intact.  He understands the postoperative plans, etc.  All  question have been encouraged and answered.                                                 [name], M.D.    [facility]  D:  07/08/2002  T:  07/08/2002  Job:  [id number]   cc:   Rockey Situ. Flavia Shipper., M.D.  [address]  [place]  [place] [address]  Fax: [phone]

## 2011-01-07 NOTE — Assessment & Plan Note (Signed)
[facility]                              CARDIOLOGY OFFICE NOTE   NAME:Renderos, ISON WICHMANN                     MRN:          [id number]  DATE:04/27/2006                            DOB:          10-Jan-1946    IDENTIFICATION:  This patient is a 65 year old gentleman with history of  coronary artery disease (status post PTCA/stent to circumflex and LAD).  This was back in 2005.  I last saw him in July.   When I saw him last he was actually doing fairly well.  I was due to see him  in the winter.   He called in and said that over the past two weeks he has been getting  abdominal and lower extremity swelling, worse over the past couple of days  to where his legs are tense.  No nausea, no shortness of breath.  No chest  pain.  No syncope.   No change in his diet.   CURRENT MEDICATIONS:  1. OxyContin 20 mg q. 8.  2. Robaxin 750 mg q. 8.  3. Lyrica 75 mg b.i.d.  4. Diazepam 2 mg t.i.d.  5. Aspirin 81 mg daily.  6. Caduet 10/80 mg daily.  7. Lisinopril 20 mg daily.  8. Toprol XL one half of a 75 mg daily.  9. Maxzide 25 mg daily.  10.Xalatan eye drops.  11.Optivar eye drops.  12.Nexium 40 mg b.i.d.  13.Cymbalta 90 mg t.i.d.  14.Rozerem q.h.s.   PHYSICAL EXAMINATION:  GENERAL:  The patient is in no distress.  VITAL SIGNS:  Blood pressure 140/82, pulse 80 and regular, weight 206  pounds.  NECK:  JVP is normal.  LUNGS:  Clear.  CARDIAC:  Regular rate and rhythm.  S1 S2.  No S3.  No murmur.  ABDOMEN:  Distended and tense.  No definite fluid wave.  EXTREMITIES:  2+ edema with some ruberous changes pitting up to past the  knee.   IMPRESSION:  I am puzzled by Mr. Honor and his swelling today.  Again, he  has known disease.  He has been asymptomatic.  His EF back on echo was  normal.  This was a sudden onset.  Check labs today.  His CMET, CBC, TSH.  He denies any dietary changes.  I have given a prescription for Lasix 40 mg  with potassium 20  milliequivalents daily to add to his Maxzide to see if we  get a better diuretic response, but again the etiology of this is unclear.   I will schedule him for an echocardiogram.  If this is negative, consider  abdominal scan with an abdominal ultrasound.   PLAN:  1. He is due to see Dr. Jeannetta Nap on Monday.  I will be in touch with him.      Otherwise, I will set follow up for three to four weeks.  2. Coronary artery disease stable, probably.  3. Dyslipidemia, continue regimen.  4. History of syncope, felt hyper vagal, no recurrence.  Pricilla Riffle, MD, [facility]    PVR/MedQ  DD:  04/27/2006  DT:  04/27/2006  Job #:  [id number]   cc:   Windle Guard, M.D.

## 2011-01-07 NOTE — Op Note (Signed)
NAME:  [name], [name]                        ACCOUNT NO.:  [id number]   MEDICAL RECORD NO.:  [id number]                   PATIENT TYPE:  OIB   LOCATION:  5031                                 FACILITY:  [facility]   PHYSICIAN:  [name], M.D.         DATE OF BIRTH:  [birth year]   DATE OF PROCEDURE:  05/17/2002  DATE OF DISCHARGE:  05/18/2002                                 OPERATIVE REPORT   PREOPERATIVE DIAGNOSIS:  Comminuted displaced right radius shaft fracture,  proximally.   POSTOPERATIVE DIAGNOSIS:  Comminuted displaced right radius shaft fracture,  proximally.   OPERATION/PROCEDURE:  Open reduction/internal fixation radial shaft fracture  with 7-hole Synthes 3.5 plate.   SURGEON:  Dionne Ano. Amanda Pea, M.D.   ASSISTANTOttie Glazier. Wynona Neat, P.A.-C.   COMPLICATIONS:  None.   ANESTHESIA:  Axillary block with IV sedation.   TOURNIQUET TIME:  Less than 90 minutes.   INDICATIONS:  This patient is a very pleasant male who presents with the  above diagnosis.  I have counseled him regarding the risks and benefits of  surgery including the risk of infection, bleeding and anesthesia___________.  Questions were encouraged and answered preoperatively.  I have evaluated the patient, discussed his care with Dr. Dutch Quint as well as  other physicians.  The patient will have attempted axillary block anesthesia  for the procedure but understands general anesthesia may be employed.   OPERATIVE FINDINGS:  The patient had a comminuted complex shaft fracture  about the radius.  Underwent open reduction/internal fixation of 7-hole  plate without difficulty.  The patient had three bicortical screws placed  proximally and distally to the fracture and had good stability and range of  motion postoperatively.  Bleeding was controlled and no injury to the nerves  were noted intraoperatively.   DESCRIPTION OF PROCEDURE:  The patient was seen by myself and anesthesia and  taken to the  operative suite after axillary block was induced by Dr. Arta Bruce.  Once this was done, the block was noted to be in excellent working  fashion.  The patient then underwent prepping and draping with Betadine  scrub and paint about the right upper extremity.  Conditions about the skin  looked excellent for surgery.  He was given a gram of Ancef preoperatively  and after thorough prep and drape, the arm was elevated and tourniquet was  inflated to 250 mmHg.  Once this was done, ___________ incision was made  about the lower aspect of the arm.  This was in line with the radius on the  volar radial aspect curving slightly to the midline.  Dissection was carried  through the skin with a knife blade. Following this the dissection was  carried down to the fascia which was split open and interval between the FCR  and brachial radialis was made.  Following this the dissection between the  radial artery and superficial radial nerve was accomplished.  The  patient  had multiple bleeding branches of the radial artery tied off as they entered  the anterior brachial radialis.  This was done with 3-0 Vicryl suture.  This  was done to my satisfaction without difficulty.  Following tying this off,  the patient had pronator teres insertion site identified.  This was lifted  off at the bone.  This was just distal to the fracture site.  Following this  supinator was identified.  The proximal portion of the radius had via  supinator elevated gently from its most medial position taking care to be  cognizant of the pack where the post interosseous nerve so that there would  be no injury.  With this performed, the patient had access to radius all the  way to the biceps insertion.  The patient then underwent curettage of the  fracture site and preparation of the fracture site with copious irrigation  and curettage of the bony ends followed by manual reduction.  Reduction was  accomplished, clamp was placed and  prebent 7-hole Synthes 3.5 BCP plate was  placed in the usual standard fashion with standard AO technique utilizing a  compression technique.  The plate sat well and I was pleased with this.  The  fracture coapted well.  Following application of the plate, I then placed  the arm and forearm in full range of motion.  Excellent stability and range  of motion was noted.  At this point in time, tourniquet was deflated and  hemostasis was obtained as it was during the initial dissection with bipolar  electrocautery and with tying off of vessels as necessary.  The patient had  soft compartments, good refill and excellent radial pulses.  At this point  in time the wound was closed over a #10 TLS drain.  This was done with a  combination of 2-0 Vicryl and staple gun at skin edges.  The pronator teres  was reapproximated with 0 Vicryl.  Following this, the patient had  compartments checked once again and they were noted to be soft.  He then had  application of a long arm plaster splint with derotation of component  applied.  He tolerated this well. There were no complications.  Following  this, he was taken to the recovery room where he was noted to be in stable  condition.   He will be observed overnight for IV antibiotics, pain control and  postoperative measures.  He will receive his next dose of Ancef at 1:30 p.m.  I have discussed with him all issues as well as his family.  All  questions  have been encouraged, answered.                                               [name], M.D.    [facility]  D:  05/17/2002  T:  05/19/2002  Job:  [id number]

## 2011-01-07 NOTE — Discharge Summary (Signed)
NAME:  [name], [name]                        ACCOUNT NO.:  [id number]   MEDICAL RECORD NO.:  [id number]                   PATIENT TYPE:  INP   LOCATION:  2912                                 FACILITY:  [facility]   PHYSICIAN:  Rollene Rotunda, M.D.                DATE OF BIRTH:  11/26/1945   DATE OF ADMISSION:  03/22/2004  DATE OF DISCHARGE:  03/24/2004                                 DISCHARGE SUMMARY   DISCHARGE DIAGNOSES:  1. Coronary artery disease, status post percutaneous transluminal coronary     angioplasty stent to left anterior descending on March 23, 2004.     A. Past history percutaneous transluminal coronary angioplasty stent X2        in June 2005 and stent X1 in July of 2005.  2. Hypertension.  3. Hypercholesterolemia.  4. Back surgery secondary to trauma.  5. Chronic back pain, neck pain.  6. Myocardial infarction September 28, 2003.  7. Questionable esophageal spasms.   HISTORY OF PRESENT ILLNESS:  The patient presented to the emergency room at  [facility] on March 22, 2004 with chest pain very similar  to the chest discomfort when he was initially diagnosed with myocardial  infarction in June, transferred to [facility] by EMS with nitroglycerin  sublingual relieving his pain.  The patient has a history of ALLERGY to  CONTRAST DYE.  Patient treated prophylactically prior to being taken to the  catheterization laboratory.   HOSPITAL COURSE:  Patient had cardiac catheterization done March 23, 2004 by  Dr. Antoine Poche.  Left main short and normal left anterior descending artery  had proximal stent with diffuse luminal irregularities.  There was mid and  distal left anterior descending with diffuse 25% lesions.  The mid left  anterior descending had a focal 80% stenosis.  There was a small mid  diagonal which was free of high grade disease.  Circumflex had prophylaxis  80% stenosis with widely patent proximal stent.  There was a large mid  marginal with  proximal and mid 30% lesions.  There was a posterior lateral  which was large and had an ostial 80% stenosis.  The right coronary artery  was non-dominant with diffuse 25 to 30% lesions.  The ejection fraction was  65% with normal wall motion.  Post catheterization the patient complained of  back pain which was a chronic problem. No chest pain and no shortness of  breath.  Angio Seal with marked area hemorrhage, no complications.  The  patient being discharged home today to continue Plavix 75 mg X12 months.   DISCHARGE PHYSICAL EXAMINATION:  VITAL SIGNS:  Temperature 97, blood  pressure 124/60, heart rate 60, respirations 18, oxygen saturation 94% on  room air.   DISCHARGE LABORATORY DATA:  Blood work shows white blood cell count 14.2,  hemoglobin 13.4, hematocrit 37.6, platelet count 252,000.  Sodium 138,  potassium 3.9, chloride 104, cO2 27, glucose  112, BUN 13, creatinine 1.0.  Lipid panel was drawn this admission with a triglycerides of 109, HDL 48,  LDL of 62 and total cholesterol 132.  Chest x-ray this admission showed low  bilateral lung volumes with bibasilar atelectasis, stable heart size, no  overt edema or effusions.  Electrocardiogram normal sinus rhythm, sinus  bradycardia with old inferior infarct from June.   DISCHARGE INSTRUCTIONS:   DISCHARGE MEDICATIONS:  1. Plavix 75 mg one p.o. daily for 12 months.  2. Lisinopril 10 mg one p.o. daily.  3. Lipitor 40 mg one p.o. daily.  4. Toprol XL 50 mg one p.o. daily.  5. Aspirin 81 mg one p.o. daily.  6. Patient is being discharged with prescription for nitroglycerin for chest     discomfort and is instructed in correct use.  7. Patient instructed to resume all pain medications as prior to this     admission per pain management clinic in [place].  Patient can also     take Tylenol for general discomfort.   ACTIVITY:  No strenuous activity X2 days.  No lifting over 10 pounds X1  week.   DIET:  Low fat, low salt, low  cholesterol diet.   WOUND CARE:  Gently clean catheterization site with soap and water.  No tub  bathing or swimming X1 week.  Call office for pain, swelling or bleeding  from catheterization site.  Patient may start cardiac rehabilitation prior  to seeing Dr. Tenny Craw.   FOLLOW UP:  Appointment scheduled with Dr. Tenny Craw at [facility] for April 08, 2004 at 10:00 A.M.  The patient also is instructed to follow up with his  primary care physician concerning increased dysphagia and occasional emesis  following swallowing.  This is a new problem for patient and he will follow  up his primary care physician, Dr. Windle Guard at [facility].      Dorian Pod, NP                       Rollene Rotunda, M.D.    MB/MEDQ  D:  03/24/2004  T:  03/24/2004  Job:  [id number]   cc:   Pricilla Riffle, M.D.   Windle Guard, M.D.  [address]  [place], [place] [address]  Fax: [phone]

## 2011-01-07 NOTE — Assessment & Plan Note (Signed)
[facility]                            CARDIOLOGY OFFICE NOTE   NAME:[name], [name]                     MRN:          [id number]  DATE:08/29/2006                            DOB:          1946-02-01    IDENTIFICATION:  [name] is a 65 year old gentleman with a history  of coronary artery disease.  I last saw him back in October.  When I saw  him last, he was still having some signs of swelling.  This had improved  with some Lasix.  In the interval, he says the swelling has gotten  better some, but he still has it.   Note that about one month ago, he had an episode of chest pain, lasting  for about a couple of minutes.  He took two nitroglycerin and it went  away.  He has not had any since.  Otherwise he is walking about one mile  or two miles a day, with no problems.  He is having problems with food  getting stuck.  He has been seen in GI for a dilatation.  He needs to  reschedule.   CURRENT MEDICATIONS:  1. OxyContin 20 mg q.8h.  2. Robaxin 750 mg.  3. Diazepam 2 mg t.i.d.  4. Aspirin 81 mg q.d.  5. Celebrex 200 mg.  6. Lisinopril 20 mg.  7. Maxzide 25 mg.  8. Zyrtec 10 mg.  9. Xalatan eye drops.  10.Optivar drops.  11.Nexium b.i.d.  12.Toprol XL 75 mg, 1/2 q.d.  13.Lasix 40 mg q.d.  14.Potassium supplement 20.   NOTATION:  Lipitor, which the patient has stopped taking.   PHYSICAL EXAMINATION:  GENERAL:  The patient is in no distress.  VITAL SIGNS:  Blood pressure 130/80, pulse 62, weight 203 pounds.  NECK:  JVP is actually normal.  LUNGS:  Clear.  HEART:  A regular rate and rhythm.  S1 and S2.  No S3.  No significant  murmurs.  ABDOMEN:  Obese.  No fluid wave.  EXTREMITIES:  With 1+ edema.   IMPRESSION/PLAN:  1. Coronary artery disease, clinically stable.  One episode of pain.      He is status post percutaneous transluminal coronary angiography      and stent to the circumflex and LAD back in 2005.  Would continue.  2.  Dyslipidemia:  The patient is not taking Lipitor.  Need to talk to      him about this.  He has called since his visit.  3. Swelling:  Again, I am not sure what brought this on.  Norvasc was      used.  It was discontinued.  Will check again a TSH, a CMET and a      CBC.  I will be in touch with him.  4. Dysphagia:  Recommend that he contact GI.  5. History of syncope, felt hyper-vagal, no recurrence.   We will check a fasting lipids today and I will be touch with him.     Pricilla Riffle, MD, [facility]  Electronically Signed    PVR/MedQ  DD: 08/29/2006  DT: 08/29/2006  Job #:  [id number]   cc:   Windle Guard, M.D.

## 2011-01-07 NOTE — Discharge Summary (Signed)
NAME:  [name], [name]                        ACCOUNT NO.:  [id number]   MEDICAL RECORD NO.:  [id number]                   PATIENT TYPE:  INP   LOCATION:  3004                                 FACILITY:  [facility]   PHYSICIAN:  Jimmye Norman, MD                     DATE OF BIRTH:  08/17/1946   DATE OF ADMISSION:  05/04/2002  DATE OF DISCHARGE:  05/09/2002                                 DISCHARGE SUMMARY   CONSULTING PHYSICIANS:  1. Henry A. Pool, M.D. for neurosurgery.  2. Javier Docker, M.D. for orthopaedic surgery.   FINAL DIAGNOSES:  1. Fall from tree, approximately 25 feet.  2. Closed head injury with right temporal parietal contusion.  3. Small subarachnoid hemorrhage.  4. Cervical spine fracture of C2, pedicle fracture.  5. C5 superior end plate compression fracture.  6. T12 compression fracture.  7. Mid radius fracture of right arm.   HISTORY OF PRESENT ILLNESS:  The patient is a 65 year old gentleman  apparently cutting branches that apparently fell approximately 25 feet to  the ground.  At that point, he was brought to [facility] Emergency  Room.  He arrived complaining of head, neck, and arm pain.  He had a  positive loss of consciousness.  He was seen in the emergency room by Dr.  Lovenia Shuck and initially and then Dr. Magnus Ivan was consulted.  Dr. Magnus Ivan saw  the patient and workup was done, and because of the findings, Dr. Jordan Likes was  consulted and Dr. Shelle Iron was consulted.  The patient's right arm was put in a  splint.  The patient was placed in C-collar and hospitalized to bedrest.  Dr. Jordan Likes saw the patient and followed the patient, ordered a TLSO brace.  This was fitted for the patient and was applied.  The patient was taught how  to dawn the brace, and noted that the patient had to use his own belt to try  to roll out of bed.  He also had a C-collar in place, and this was to remain  in place for the next 12 weeks.  Orthopaedics saw the patient and noted that  he would follow up with Dr. Amanda Pea on Monday, as this information was given  to the patient, Dr. Carlos Levering phone number was given, and he was told to call  to make sure an appointment was made for Monday, 05/13/02.  The patient was  also given Dr. Lindalou Hose number to call and make an appointment to see him in  approximately two weeks.  The patient would follow up with trauma services  in two weeks.  The patient is given Percocet one or two p.o. q.4-6h. p.r.n.  pain, and also given Motrin 800 mg one p.o. q.8h. p.r.n.  The patient will  follow up with trauma services as noted on Tuesday, 05/21/02.  Discussion was  made with the patient as far as care  at home, and it is noted that bathing  would be best done with the help of another person while sitting in a tub  seat and showering that way, remaining still.  For removal of the C-collar  to allow him to be shaved and to be cleaned, and the C-collar could be  cleaned.  Also, the brace could be removed while sitting still in the  shower, and then reapplied after he is dried off.  This is made perfectly  clear to the patient that he should do no twisting at anytime when the brace  is on.  The patient is subsequently discharged home in satisfactory and  stable condition on 05/09/02.      Phineas Semen, [place]                        Jimmye Norman, MD    CL/MEDQ  D:  05/09/2002  T:  05/12/2002  Job:  [id number]   cc:   Sherilyn Cooter A. Pool, M.D.  [address]  [place]  [place] [address]  Fax: [phone]   Javier Docker, M.D.   [name], M.D.   Jimmye Norman III, MD  [address], [address]  [place]  [place] [address]  Fax: [phone]

## 2011-01-07 NOTE — Assessment & Plan Note (Signed)
[facility]                              CARDIOLOGY OFFICE NOTE   [name], [name] [name]                     MRN:          [id number]  DATE:03/01/2006                            DOB:          06-20-1946    IDENTIFICATION:  [name] is a 65 year old gentleman with a history of  CAD (status post PTCA/stent to the circumflex and LAD).  I last saw him back  in May.   When I saw him last, he was complaining of some chest pressure.  He also had  a syncopal spell while driving.  I was not convinced it was cardiac in  etiology.  He choked and may have been more of a hypervagal response.   I spoke to the patient after he left clinic on the 23rd.  He was feeling  better with no spells.   In the interval, he has not had any syncopal spells.  He had one episode of  chest pain this weekend, took two nitroglycerin, it occurred with no  activity.   Otherwise, he is breathing okay, activity is about the same.   CURRENT MEDICATIONS:  1.  OxyContin 20 q.8 h.  2.  Robaxin 50 q.8 h.  3.  Lyrica 75 b.i.d.  4.  Diazepam 2 mg t.i.d.  5.  Aspirin 81 mg a day.  6.  Lipitor 40 daily.  7.  Plavix 75 daily.  8.  Lisinopril 20 daily.  9.  Toprol-XL 75, one half tab daily.  10. Maxzide 25 daily.  11. Norvasc 5 daily.  12. Zyrtec 10 daily.  13. Xalatan eye drops and Optivar eye drops.  14. Nexium 40 b.i.d.  15. Cymbalta 60 daily.  16. Rozerem 8 mg nightly.   PHYSICAL EXAMINATION:  GENERAL:  The patient is in no distress.  VITAL SIGNS:  Blood pressure 134/82, pulse 90, weight 202.  LUNGS:  Clear.  NECK:  JVP is normal without bruits.  CARDIAC:  Regular rate and rhythm, S1, S2, no S3, no murmurs.  ABDOMEN:  Benign.  EXTREMITIES:  No edema.   IMPRESSION:  1.  Coronary artery disease.  One episode of pain.  I am not convinced it      was cardiac in origin, responded though to nitro.  Would continue on      current regimen.  2.  Dyslipidemia.  Continue on  current regimen.  Again, lipid panel back in      early winter showed an LDL of 63, HDL of 46.  We will need to check on      liver function tests.  3.  Syncope recurrence.  Again,  I think it was more hypervagal after an      episode of liquid getting caught in throat.   I will expect to see the patient back in the winter, sooner if problems  develop.  Continue on current regimen for now.                                Pricilla Riffle, MD,  [facility]    PVR/MedQ  DD:  03/01/2006  DT:  03/01/2006  Job #:  [id number]

## 2011-01-07 NOTE — H&P (Signed)
NAME:  TRAEGER, [name] NO.:  [id number]   MEDICAL RECORD NO.:  [id number]                   PATIENT TYPE:  EMS   LOCATION:  [facility]                                 FACILITY:  [facility]   PHYSICIAN:  Alberta Bing, M.D.               DATE OF BIRTH:  19-Aug-1946   DATE OF ADMISSION:  03/22/2004  DATE OF DISCHARGE:                                HISTORY & PHYSICAL   HISTORY OF PRESENT ILLNESS:  Mr. Dinneen is a 65 year old gentleman  scheduled to see Dr. Tenny Craw tomorrow to transfer cardiology care from his  previous physician at [facility].  Unfortunately, he developed his  typical ischemic chest discomfort this morning that was quite severe and  associated dyspnea.  The discomfort was mid epigastric and had both  squeezing and sharp characteristics without radiation.  There was associated  dyspnea.  The patient took nitroglycerin without relief and summoned EMS.  With two additional nitroglycerin, pain improved, but persisted until the  time he came to the emergency room  where it subsequently faded.  Since  intravenous  nitroglycerin has been initiated, he has felt fine.  The total  duration of discomfort was about 40 minutes.   Cardiac history dates to June 2005 when the patient was admitted to [facility] with an acute myocardial infarction.  He underwent urgent cath and  percutaneous intervention including two drug eluding stents, presumably the  right coronary artery.  He returned electively for stent placement of  another stenosis, presumably in a different vessel.  He did well until this  morning when he developed recurrent discomfort that was very similar to his  initial MI symptoms.   Mr. Hippert has a history of hypertension and hyperlipidemia.  He does not  have diabetes.  He has had an allergic reaction to intravenous  contrast in  the form of a skin rash.  Past medical history is otherwise notable for back  surgery on two occasions  after an initial traumatic injury.  He continues to  have chronic back pain.  There was a history of esophageal spasm.   He reports allergy or adverse reactions to intravenous  contrast, Verapamil,  Wellbutrin, and tetracycline.   Recent medications have included Terrastatin 40 mg daily, Lisinopril 10 mg  daily, Metoprolol 50 mg daily, aspirin 81 mg daily, Diazepam 2 mg t.i.d.,  Robaxin 500 mg t.i.d., Neurontin 300 mg q.i.d., OxyContin 10 mg t.i.d.,  hydrocodone p.r.n.   SOCIAL HISTORY:  Disabled, lives in [place] with his wife, has two  healthy children.  15-20 pack year history of cigarette smoking.  Denies  excessive use of alcohol or use of illicit drugs.   FAMILY HISTORY:  Mother died from cancer in her 65s, father suffered a fatal  myocardial infarction at age 31.  One of his siblings has diabetes and has  undergone CABG surgery.   REVIEW OF SYMPTOMS:  Recent difficulty swallowing  with solids becoming  lodged in his esophagus, sometimes this results in emesis.  He has nocturia,  approximately twice per night.  All other systems reviewed and are negative.   PHYSICAL EXAMINATION:  GENERAL:  On exam, well appearing, somewhat overweight gentleman in no acute  distress.  VITAL SIGNS:  Temperature 98.3, heart rate 90 and regular, respirations 20,  blood pressure 110/65, O2 saturation 99% on 2 liters.  HEENT:  Normal lids  and conjunctivae, anicteric sclerae.  NECK:  No jugular venous distention, no carotid bruits.  ENDOCRINE:  No thyromegaly.  HEMATOPRURITIC:  No cervical, axillary, or inguinal adenopathy.  SKIN:  No significant lesions.  Long surgical scar over the right arm  reflecting ORIF after a fall.  LUNGS:  Moderate left basilar rales, a few right basilar rales.  CARDIAC:  Normal first and second heart sounds, fourth heart sound present.  ABDOMEN:  Soft, nontender, no organomegaly, no bruits, no masses.  EXTREMITIES:  Normal distal pulses.  Trace edema.   NEUROMUSCULAR:  Symmetric strength and tone.  MUSCULOSKELETAL:  No joint deformities.   LABORATORY DATA:  EKG sinus bradycardia, prior inferior-posterior myocardial  infarction, slight J-point elevation.  No significant change when compared  to a prior tracing October 16, 2003.   IMPRESSION:  Mr. Ventrella is a young gentleman with known coronary disease  and recent coronary intervention in the setting of acute myocardial  infarction.  He has had no chest discomfort other than with his initial  ischemic event.  Since there is a possibility of stent thrombosis or early  restenosis, coronary angiography is warranted to evaluate the status post  his stents and coronary arteries.  Initial treatment would be with  intravenous nitroglycerin and heparin.  Due to his history of contrast  allergy, steroids, H2 blockers, and antihistamines will be administered  prior to coronary angiography, which will be performed on a semi-urgent  basis today.  We will reassess and optimize his medications.                                                [place] Bing, M.D.    RR/MEDQ  D:  03/22/2004  T:  03/22/2004  Job:  [id number]

## 2011-02-13 ENCOUNTER — Other Ambulatory Visit: Payer: Self-pay | Admitting: *Deleted

## 2011-02-13 MED ORDER — TRIAMTERENE-HCTZ 37.5-25 MG PO CAPS: 1.0000 | ORAL_CAPSULE | ORAL | Status: DC

## 2011-03-03 ENCOUNTER — Other Ambulatory Visit: Payer: Self-pay | Admitting: Endocrinology

## 2011-03-03 ENCOUNTER — Other Ambulatory Visit: Payer: Self-pay

## 2011-03-03 NOTE — Telephone Encounter (Signed)
A user error has taken place: encounter opened in error, closed for administrative reasons.

## 2011-03-24 ENCOUNTER — Ambulatory Visit: Payer: Self-pay | Admitting: Endocrinology

## 2011-03-31 ENCOUNTER — Ambulatory Visit: Payer: Self-pay | Admitting: Endocrinology

## 2011-04-15 ENCOUNTER — Ambulatory Visit: Payer: Self-pay | Admitting: Endocrinology

## 2011-04-28 ENCOUNTER — Ambulatory Visit: Payer: Self-pay | Admitting: Endocrinology

## 2011-05-10 ENCOUNTER — Ambulatory Visit: Payer: Self-pay | Admitting: Endocrinology

## 2011-05-12 ENCOUNTER — Ambulatory Visit: Payer: Self-pay | Admitting: Endocrinology

## 2011-05-23 LAB — CARDIAC PANEL(CRET KIN+CKTOT+MB+TROPI)
CK, MB: 1.3
Relative Index: INVALID
Total CK: 44
Troponin I: 0.01

## 2011-05-23 LAB — CBC
HCT: 40.9
HCT: 43.5
Hemoglobin: 14.2
Hemoglobin: 14.9
MCHC: 34.7
MCV: 101.6 — ABNORMAL HIGH
RBC: 4.29
RDW: 13.2
WBC: 4.9

## 2011-05-23 LAB — TROPONIN I: Troponin I: 0.01

## 2011-05-23 LAB — GLUCOSE, CAPILLARY
Glucose-Capillary: 102 — ABNORMAL HIGH
Glucose-Capillary: 110 — ABNORMAL HIGH
Glucose-Capillary: 118 — ABNORMAL HIGH
Glucose-Capillary: 126 — ABNORMAL HIGH
Glucose-Capillary: 247 — ABNORMAL HIGH

## 2011-05-23 LAB — APTT: aPTT: 28

## 2011-05-23 LAB — POCT I-STAT, CHEM 8
Creatinine, Ser: 0.9
Glucose, Bld: 197 — ABNORMAL HIGH
Hemoglobin: 15
Sodium: 135
TCO2: 29

## 2011-05-23 LAB — ROCKY MTN SPOTTED FVR AB, IGM-BLOOD: RMSF IgM: 0.62 IV

## 2011-05-23 LAB — PROTIME-INR: INR: 1.1

## 2011-05-23 LAB — CK TOTAL AND CKMB (NOT AT ARMC): CK, MB: 1.5

## 2011-05-23 LAB — BASIC METABOLIC PANEL
Chloride: 99
GFR calc Af Amer: >60
Potassium: 3.6

## 2011-05-23 LAB — MAGNESIUM: Magnesium: 1.9

## 2011-05-25 ENCOUNTER — Other Ambulatory Visit: Payer: Self-pay | Admitting: Endocrinology

## 2011-05-26 ENCOUNTER — Ambulatory Visit: Payer: Self-pay | Admitting: Endocrinology

## 2011-06-07 ENCOUNTER — Ambulatory Visit: Payer: Self-pay | Admitting: Endocrinology

## 2011-06-21 ENCOUNTER — Ambulatory Visit: Payer: Self-pay | Admitting: Endocrinology

## 2011-06-23 ENCOUNTER — Ambulatory Visit: Payer: Self-pay | Admitting: Endocrinology

## 2011-06-27 ENCOUNTER — Other Ambulatory Visit: Payer: Self-pay | Admitting: *Deleted

## 2011-06-27 MED ORDER — TRIAMTERENE-HCTZ 37.5-25 MG PO CAPS: 1.0000 | ORAL_CAPSULE | ORAL | Status: DC

## 2011-06-30 ENCOUNTER — Ambulatory Visit: Payer: Self-pay | Admitting: Endocrinology

## 2011-10-19 ENCOUNTER — Other Ambulatory Visit: Payer: Self-pay | Admitting: *Deleted

## 2011-10-19 MED ORDER — SIMVASTATIN 80 MG PO TABS: 80.0000 mg | ORAL_TABLET | Freq: Every day | ORAL | Status: DC

## 2011-12-15 ENCOUNTER — Other Ambulatory Visit: Payer: Self-pay | Admitting: Internal Medicine

## 2011-12-23 ENCOUNTER — Telehealth: Payer: Self-pay | Admitting: Internal Medicine

## 2011-12-23 NOTE — Telephone Encounter (Signed)
Did not call him. Has not been seen in a long time. Scheduling team will call him for an appointment.

## 2011-12-23 NOTE — Telephone Encounter (Signed)
Fu msg Pt returning your call 

## 2011-12-29 ENCOUNTER — Telehealth: Payer: Self-pay | Admitting: Internal Medicine

## 2011-12-29 NOTE — Telephone Encounter (Signed)
Please return call to patient regarding upcoming back injection.  Patient can be reached at [phone]

## 2011-12-29 NOTE — Telephone Encounter (Signed)
Pt needs a spinal injection and radiology requested he be off his plavix 10 days, last stent placed well over a year. Please advise if he can hold it that long, do you want him to remain on asa? Pt aware he will not hear news till either 5/10 or 5/13.

## 2012-01-01 NOTE — Telephone Encounter (Signed)
It has been over a year since seen It is OK to stop Plavix altogether.  I would keep on ASA  Discuss with radiology if he can stay ion this fore injection.

## 2012-01-02 ENCOUNTER — Ambulatory Visit (INDEPENDENT_AMBULATORY_CARE_PROVIDER_SITE_OTHER): Payer: Medicare Other | Admitting: Internal Medicine

## 2012-01-02 ENCOUNTER — Encounter: Payer: Self-pay | Admitting: Internal Medicine

## 2012-01-02 VITALS — BP 150/90 | HR 79 | Ht 64.0 in | Wt 160.1 lb

## 2012-01-02 DIAGNOSIS — E7800 Pure hypercholesterolemia, unspecified: Secondary | ICD-10-CM

## 2012-01-02 DIAGNOSIS — I2510 Atherosclerotic heart disease of native coronary artery without angina pectoris: Secondary | ICD-10-CM

## 2012-01-02 DIAGNOSIS — R5383 Other fatigue: Secondary | ICD-10-CM

## 2012-01-02 DIAGNOSIS — R5381 Other malaise: Secondary | ICD-10-CM

## 2012-01-02 NOTE — Progress Notes (Signed)
ID  [name] is a -year-old gentleman with a history of CAD (status post PTCA/stent (DES) to the circumflex and LAD in 2005)  I have not seen him in several years. Since seen his biggest problem is back pain.  He is being evaluated for injection.  Wants to hold plavix He denies CP.  Breathing is OK.  Activity is severely limited by back problems/pain.  Patient has not seen Dr Everardo All in years.  NO recent blood work. Allergies  Allergen Reactions  . Bupropion Hcl   . Doxycycline Hyclate     REACTION: unspecified  . Tetracycline   . Verapamil     REACTION: unspecified    Current Outpatient Prescriptions  Medication Sig Dispense Refill  . aspirin 81 MG tablet Take 81 mg by mouth daily.        Marland Kitchen azelastine (OPTIVAR) 0.05 % ophthalmic solution Place 1 drop into both eyes 2 (two) times daily.        . chlorhexidine (PERIDEX) 0.12 % solution As directed      . clopidogrel (PLAVIX) 75 MG tablet TAKE 1 BY MOUTH EVERY DAY  30 tablet  1  . DULoxetine (CYMBALTA) 60 MG capsule Take 60 mg by mouth 2 (two) times daily.        Marland Kitchen latanoprost (XALATAN) 0.005 % ophthalmic solution Place 1 drop into both eyes 2 (two) times daily.        Marland Kitchen lisinopril (PRINIVIL,ZESTRIL) 20 MG tablet Take 20 mg by mouth daily.        . metFORMIN (GLUCOPHAGE-XR) 500 MG 24 hr tablet TAKE 2 TABLETS EACH MORNING  60 tablet  0  . methocarbamol (ROBAXIN) 750 MG tablet Take 750 mg by mouth daily as needed.        . metoprolol (TOPROL-XL) 50 MG 24 hr tablet Take 1/2 tablet by mouth once daily       . nitroGLYCERIN (NITROSTAT) 0.4 MG SL tablet Place 0.4 mg under the tongue every 5 (five) minutes as needed.        . Oxycodone HCl (OXYCONTIN) 60 MG TB12 Take 1 tablet by mouth every 4 (four) hours as needed.        Marland Kitchen oxyCODONE-acetaminophen (PERCOCET) 10-325 MG per tablet Take 1 tablet by mouth daily. Dr Vear Clock       . potassium chloride SA (K-DUR,KLOR-CON) 20 MEQ tablet Take 20 mEq by mouth daily.        . predniSONE (DELTASONE)  20 MG tablet 1 tab as directed      . simvastatin (ZOCOR) 80 MG tablet Take 1 tablet (80 mg total) by mouth daily.  30 tablet  3  . triamterene-hydrochlorothiazide (DYAZIDE) 37.5-25 MG per capsule Take 1 each (1 capsule total) by mouth every morning.  90 capsule  1    Past Medical History  Diagnosis Date  . DIABETES MELLITUS, TYPE II 05/02/2007  . HYPERCHOLESTEROLEMIA 10/27/2009  . DEPRESSION 10/27/2009  . HYPERTENSION 05/02/2007  . CORONARY ARTERY DISEASE 05/02/2007  . ALLERGIC RHINITIS 05/02/2007  . ESOPHAGEAL STRICTURE 12/10/2007  . GERD 12/10/2007  . OSTEOARTHRITIS, LUMBAR SPINE 10/27/2009  . DUODENITIS 12/10/2007  . TRANSAMINASES, SERUM, ELEVATED 12/10/2007  . Insomnia     Past Surgical History  Procedure Date  . Esophagogastroduodenoscopy 03/20/2007  . Bilateral vats ablation 2011    Family History  Problem Relation Age of Onset  . Cancer Mother     uncertain type    History   Social History  . Marital Status: Married  Spouse Name: [name]    Number of Children: [name]  . Years of Education: [name]   Occupational History  . Disabled    Social History Main Topics  . Smoking status: Current Everyday Smoker -- 2.0 packs/day    Types: Cigarettes  . Smokeless tobacco: Not on file  . Alcohol Use: No  . Drug Use: Not on file  . Sexually Active: Not on file   Other Topics Concern  . Not on file   Social History Narrative  . No narrative on file    Review of Systems:  All systems reviewed.  They are negative to the above problem except as previously stated.  Vital Signs: BP 150/90  Pulse 79  Ht 5\' 4"  (1.626 m)  Wt 160 lb 1.9 oz (72.63 kg)  BMI 27.48 kg/m2  Physical Exam Patient is severely bent, using cane  In NAD  HEENT:  Normocephalic, atraumatic. EOMI, PERRLA.  Neck: JVP is normal. No thyromegaly. No bruits.  Lungs: clear to auscultation. No rales no wheezes.  Heart: Regular rate and rhythm. Normal S1, S2. No S3.   No significant murmurs. PMI not displaced.   Abdomen:  Supple, nontender. Normal bowel sounds. . No hepatomegaly.  Extremities:   Good distal pulses throughout. No lower extremity edema.  Musculoskeletal patient with signif kyphosis.   EKG:  SR  79 bpm.  RBBB.  T wave inversion III, AVF   Assessment and Plan:  1.  CAD>  Patient without complaints of CP  Is not that active but I am not convinced of active symptoms. He had remote DES.  I will review with interventional but I do not think there is signif benefit from plavix rx and I think he could stop.  He is anxious about it.   Stop for now pre procedure.  Discuss with radiology stopping ASA   2.  Dyslipidemia  Will check lipids. 3.  HTN.  BP is high.  I would not make changes for now.  Will need to be followed. 4.  HCM  Will check CBC, TSH, CMET.  Encouraged him to reestablish with a primary MD.

## 2012-01-02 NOTE — Patient Instructions (Signed)
STOP Plavix.  Lab  work today We will call you with results.

## 2012-01-02 NOTE — Telephone Encounter (Signed)
Patient has an appointment to be seen this PM in the clinic.

## 2012-01-03 LAB — BASIC METABOLIC PANEL
Chloride: 107 mEq/L (ref 96–112)
Potassium: 3.7 mEq/L (ref 3.5–5.1)
Sodium: 141 mEq/L (ref 135–145)

## 2012-01-03 LAB — HEPATIC FUNCTION PANEL
ALT: 13 U/L (ref 0–53)
AST: 18 U/L (ref 0–37)
Bilirubin, Direct: 0 mg/dL (ref 0.0–0.3)
Total Bilirubin: 0.4 mg/dL (ref 0.3–1.2)

## 2012-01-03 LAB — CBC WITH DIFFERENTIAL/PLATELET
Basophils Absolute: 0.1 10*3/uL (ref 0.0–0.1)
Basophils Relative: 1 % (ref 0.0–3.0)
Eosinophils Absolute: 0.2 10*3/uL (ref 0.0–0.7)
Lymphocytes Relative: 35 % (ref 12.0–46.0)
MCHC: 33.5 g/dL (ref 30.0–36.0)
Neutrophils Relative %: 57 % (ref 43.0–77.0)
RBC: 3.78 Mil/uL — ABNORMAL LOW (ref 4.22–5.81)
RDW: 13.6 % (ref 11.5–14.6)

## 2012-01-03 LAB — TSH: TSH: 1.82 u[IU]/mL (ref 0.35–5.50)

## 2012-01-03 LAB — LIPID PANEL
HDL: 61 mg/dL (ref >39.00)
Total CHOL/HDL Ratio: 3
VLDL: 17.6 mg/dL (ref 0.0–40.0)

## 2012-01-06 ENCOUNTER — Ambulatory Visit: Payer: Self-pay | Admitting: Physician Assistant

## 2012-02-20 ENCOUNTER — Other Ambulatory Visit: Payer: Self-pay | Admitting: Specialist

## 2012-02-20 DIAGNOSIS — M419 Scoliosis, unspecified: Secondary | ICD-10-CM

## 2012-02-28 ENCOUNTER — Ambulatory Visit
Admission: RE | Admit: 2012-02-28 | Discharge: 2012-02-28 | Disposition: A | Discharge status: Home / Self Care | Payer: Medicare Other | Source: Ambulatory Visit | Attending: Specialist | Admitting: Specialist

## 2012-02-28 VITALS — BP 113/61 | HR 69

## 2012-02-28 DIAGNOSIS — M419 Scoliosis, unspecified: Secondary | ICD-10-CM

## 2012-02-28 DIAGNOSIS — M479 Spondylosis, unspecified: Secondary | ICD-10-CM

## 2012-02-28 MED ORDER — IOHEXOL 300 MG/ML  SOLN
10.0000 mL | Freq: Once | INTRAMUSCULAR | Status: AC | PRN
  Administered 2012-02-28: 10 mL via INTRATHECAL

## 2012-02-28 MED ORDER — ONDANSETRON HCL 4 MG/2ML IJ SOLN: 4.0000 mg | Freq: Four times a day (QID) | INTRAMUSCULAR | Status: DC | PRN

## 2012-02-28 MED ORDER — MORPHINE SULFATE 4 MG/ML IJ SOLN
4.0000 mg | Freq: Once | INTRAMUSCULAR | Status: AC
  Administered 2012-02-28: 4 mg via INTRAMUSCULAR

## 2012-02-28 MED ORDER — DIAZEPAM 5 MG PO TABS
10.0000 mg | ORAL_TABLET | Freq: Once | ORAL | Status: AC
  Administered 2012-02-28: 10 mg via ORAL

## 2012-02-29 ENCOUNTER — Other Ambulatory Visit: Payer: Self-pay

## 2012-02-29 MED ORDER — METOPROLOL SUCCINATE ER 50 MG PO TB24: 50.0000 mg | ORAL_TABLET | Freq: Every day | ORAL | Status: DC

## 2012-02-29 NOTE — Telephone Encounter (Signed)
..   Requested Prescriptions   Signed Prescriptions Disp Refills  . metoprolol succinate (TOPROL-XL) 50 MG 24 hr tablet 90 tablet 3    Sig: Take 1 tablet (50 mg total) by mouth daily. Take 1/2 tablet by mouth once daily    Authorizing Provider: Pricilla Riffle    Ordering User: Christella Hartigan, Ramzy Cappelletti Judie Petit

## 2012-03-08 ENCOUNTER — Other Ambulatory Visit: Payer: Self-pay | Admitting: Endocrinology

## 2012-03-12 ENCOUNTER — Other Ambulatory Visit (INDEPENDENT_AMBULATORY_CARE_PROVIDER_SITE_OTHER): Payer: Medicare Other

## 2012-03-12 ENCOUNTER — Encounter: Payer: Self-pay | Admitting: Endocrinology

## 2012-03-12 ENCOUNTER — Ambulatory Visit (INDEPENDENT_AMBULATORY_CARE_PROVIDER_SITE_OTHER): Payer: Medicare Other | Admitting: Endocrinology

## 2012-03-12 VITALS — BP 112/58 | HR 69 | Temp 98.8°F | Ht 66.0 in | Wt 155.0 lb

## 2012-03-12 DIAGNOSIS — E119 Type 2 diabetes mellitus without complications: Secondary | ICD-10-CM

## 2012-03-12 DIAGNOSIS — R209 Unspecified disturbances of skin sensation: Secondary | ICD-10-CM

## 2012-03-12 LAB — HEMOGLOBIN A1C: Hgb A1c MFr Bld: 5.9 % (ref 4.6–6.5)

## 2012-03-12 MED ORDER — TRIAMCINOLONE ACETONIDE 0.1 % EX CREA: TOPICAL_CREAM | Freq: Two times a day (BID) | CUTANEOUS | Status: AC

## 2012-03-12 NOTE — Progress Notes (Signed)
Subjective:    Patient ID: [name], male    DOB: 05-08-1946, 66 y.o.   MRN: [id number]  HPI Pt returns for f/u of type 2 DM (dx'ed 2008; complicated by CAD and peripheral sensory neuropathy).  no cbg record, but states cbg's are well-controlled.  Pt reports few years of moderate rash on the legs, and assoc itching.  Past Medical History  Diagnosis Date  . DIABETES MELLITUS, TYPE II 05/02/2007  . HYPERCHOLESTEROLEMIA 10/27/2009  . DEPRESSION 10/27/2009  . HYPERTENSION 05/02/2007  . CORONARY ARTERY DISEASE 05/02/2007  . ALLERGIC RHINITIS 05/02/2007  . ESOPHAGEAL STRICTURE 12/10/2007  . GERD 12/10/2007  . OSTEOARTHRITIS, LUMBAR SPINE 10/27/2009  . DUODENITIS 12/10/2007  . TRANSAMINASES, SERUM, ELEVATED 12/10/2007  . Insomnia     Past Surgical History  Procedure Date  . Esophagogastroduodenoscopy 03/20/2007  . Bilateral vats ablation 2011    History   Social History  . Marital Status: Married    Spouse Name: [name]    Number of Children: [name]  . Years of Education: [name]   Occupational History  . Disabled    Social History Main Topics  . Smoking status: Current Everyday Smoker -- 2.0 packs/day for 56 years    Types: Cigarettes  . Smokeless tobacco: Never Used  . Alcohol Use: No  . Drug Use: Not on file  . Sexually Active: Not on file   Other Topics Concern  . Not on file   Social History Narrative  . No narrative on file    Current Outpatient Prescriptions on File Prior to Visit  Medication Sig Dispense Refill  . aspirin 81 MG tablet Take 81 mg by mouth daily.        Marland Kitchen azelastine (OPTIVAR) 0.05 % ophthalmic solution Place 1 drop into both eyes 2 (two) times daily.        . chlorhexidine (PERIDEX) 0.12 % solution As directed      . DULoxetine (CYMBALTA) 60 MG capsule Take 60 mg by mouth 2 (two) times daily.        Marland Kitchen latanoprost (XALATAN) 0.005 % ophthalmic solution Place 1 drop into both eyes 2 (two) times daily.        Marland Kitchen lisinopril (PRINIVIL,ZESTRIL) 20 MG tablet Take  20 mg by mouth daily.        . metFORMIN (GLUCOPHAGE-XR) 500 MG 24 hr tablet Take 500 mg by mouth daily with breakfast.       . methocarbamol (ROBAXIN) 750 MG tablet Take 750 mg by mouth daily as needed.        . metoprolol succinate (TOPROL-XL) 50 MG 24 hr tablet Take 1 tablet (50 mg total) by mouth daily. Take 1/2 tablet by mouth once daily  90 tablet  3  . nitroGLYCERIN (NITROSTAT) 0.4 MG SL tablet Place 0.4 mg under the tongue every 5 (five) minutes as needed.        . Oxycodone HCl (OXYCONTIN) 60 MG TB12 Take 1 tablet by mouth every 4 (four) hours as needed.        Marland Kitchen oxyCODONE-acetaminophen (PERCOCET) 10-325 MG per tablet Take 1 tablet by mouth daily. Dr Vear Clock       . potassium chloride SA (K-DUR,KLOR-CON) 20 MEQ tablet Take 20 mEq by mouth daily.        . simvastatin (ZOCOR) 80 MG tablet Take 1 tablet (80 mg total) by mouth daily.  30 tablet  3  . triamterene-hydrochlorothiazide (DYAZIDE) 37.5-25 MG per capsule Take 1 each (1 capsule total) by mouth every  morning.  90 capsule  1    Allergies  Allergen Reactions  . Bupropion Hcl Other (See Comments)    "messes with my head"  . Doxycycline Hyclate Diarrhea       . Tetracycline Diarrhea  . Verapamil Other (See Comments)    "messes with my head"   Family History  Problem Relation Age of Onset  . Cancer Mother     uncertain type   BP 112/58  Pulse 69  Temp 98.8 F (37.1 C) (Oral)  Ht 5\' 6"  (1.676 m)  Wt 155 lb (70.308 kg)  BMI 25.02 kg/m2  SpO2 96%  Review of Systems He has lost weight.  Denies sob.     Objective:   Physical Exam VITAL SIGNS:  See vs page GENERAL: no distress Pulses: dorsalis pedis intact bilat.   Feet: no deformity.  no ulcer on the feet.  feet are of normal color and temp.  Trace bilat leg edema.  There is bilateral onychomycosis and varicosities.  There are large areas of skin that is hypoplastic and slightly red.  Neuro: sensation is intact to touch on the feet, but decreased from normal. Lab  Results  Component Value Date   HGBA1C 5.9 03/12/2012      Assessment & Plan:  Rash, new.  ? NLD Dm.  well-controlled Numbness, uncertain etiology

## 2012-03-12 NOTE — Patient Instructions (Addendum)
blood tests are being requested for you today.  You will receive a letter with results. Please come back for a follow-up appointment in 6 months. i have sent a prescription to your pharmacy, for an anti-itch cream. Please call if you decide to see a dermatologist.

## 2012-03-13 ENCOUNTER — Encounter: Payer: Self-pay | Admitting: Endocrinology

## 2012-03-14 ENCOUNTER — Telehealth: Payer: Self-pay | Admitting: *Deleted

## 2012-03-14 NOTE — Telephone Encounter (Signed)
Called pt to inform of lab results, left message for pt to callback office (letter also mailed to pt). 

## 2012-03-14 NOTE — Telephone Encounter (Signed)
Pt informed of lab results. 

## 2012-04-12 ENCOUNTER — Other Ambulatory Visit: Payer: Self-pay | Admitting: Endocrinology

## 2012-09-11 ENCOUNTER — Ambulatory Visit: Payer: Medicare Other | Admitting: Endocrinology

## 2012-09-26 ENCOUNTER — Ambulatory Visit: Payer: Medicare Other | Admitting: Endocrinology

## 2012-10-09 ENCOUNTER — Ambulatory Visit: Payer: Medicare Other | Admitting: Endocrinology

## 2012-10-30 ENCOUNTER — Ambulatory Visit: Payer: Medicare Other | Admitting: Endocrinology

## 2012-11-27 ENCOUNTER — Ambulatory Visit: Payer: Medicare Other | Admitting: Endocrinology

## 2012-12-03 ENCOUNTER — Other Ambulatory Visit: Payer: Self-pay | Admitting: Anesthesiology

## 2012-12-03 DIAGNOSIS — M5412 Radiculopathy, cervical region: Secondary | ICD-10-CM

## 2012-12-05 ENCOUNTER — Ambulatory Visit
Admission: RE | Admit: 2012-12-05 | Discharge: 2012-12-05 | Disposition: A | Discharge status: Home / Self Care | Payer: Medicare Other | Source: Ambulatory Visit | Attending: Anesthesiology | Admitting: Anesthesiology

## 2012-12-05 DIAGNOSIS — M5412 Radiculopathy, cervical region: Secondary | ICD-10-CM

## 2013-01-16 ENCOUNTER — Ambulatory Visit
Admission: RE | Admit: 2013-01-16 | Discharge: 2013-01-16 | Disposition: A | Discharge status: Home / Self Care | Payer: Medicare Other | Source: Ambulatory Visit | Attending: Family Medicine | Admitting: Family Medicine

## 2013-01-16 ENCOUNTER — Other Ambulatory Visit: Payer: Self-pay | Admitting: Family Medicine

## 2013-01-16 DIAGNOSIS — R7989 Other specified abnormal findings of blood chemistry: Secondary | ICD-10-CM

## 2013-01-16 DIAGNOSIS — R52 Pain, unspecified: Secondary | ICD-10-CM

## 2013-01-16 DIAGNOSIS — R609 Edema, unspecified: Secondary | ICD-10-CM

## 2013-01-29 ENCOUNTER — Encounter: Payer: Self-pay | Admitting: Endocrinology

## 2013-01-29 ENCOUNTER — Ambulatory Visit (INDEPENDENT_AMBULATORY_CARE_PROVIDER_SITE_OTHER): Payer: Medicare Other | Admitting: Endocrinology

## 2013-01-29 VITALS — BP 132/70 | Ht 65.0 in | Wt 178.0 lb

## 2013-01-29 DIAGNOSIS — E119 Type 2 diabetes mellitus without complications: Secondary | ICD-10-CM

## 2013-01-29 MED ORDER — GLUCOSE BLOOD VI STRP: 1.0000 | ORAL_STRIP | Freq: Every day | Status: DC

## 2013-01-29 NOTE — Patient Instructions (Addendum)
i'll review your test results when they are available.   If the blood sugar is high, let's add "bromocriptine."  However, we may instead be able to increase the metformin.   Please come back for a follow-up appointment in 6 months.

## 2013-01-29 NOTE — Progress Notes (Signed)
Subjective:    Patient ID: [name], male    DOB: 04/20/1946, 67 y.o.   MRN: [id number]  HPI Pt returns for f/u of type 2 DM (dx'ed 2008; he has moderate neuropathy of the lower extremities; he has associated CAD).  no cbg record, but states cbg's are well-controlled.   Past Medical History  Diagnosis Date  . DIABETES MELLITUS, TYPE II 05/02/2007  . HYPERCHOLESTEROLEMIA 10/27/2009  . DEPRESSION 10/27/2009  . HYPERTENSION 05/02/2007  . CORONARY ARTERY DISEASE 05/02/2007  . ALLERGIC RHINITIS 05/02/2007  . ESOPHAGEAL STRICTURE 12/10/2007  . GERD 12/10/2007  . OSTEOARTHRITIS, LUMBAR SPINE 10/27/2009  . DUODENITIS 12/10/2007  . TRANSAMINASES, SERUM, ELEVATED 12/10/2007  . Insomnia     Past Surgical History  Procedure Laterality Date  . Esophagogastroduodenoscopy  03/20/2007  . Bilateral vats ablation  2011    History   Social History  . Marital Status: Married    Spouse Name: [name]    Number of Children: [name]  . Years of Education: [name]   Occupational History  . Disabled    Social History Main Topics  . Smoking status: Current Every Day Smoker -- 2.00 packs/day for 56 years    Types: Cigarettes  . Smokeless tobacco: Never Used  . Alcohol Use: No  . Drug Use: Not on file  . Sexually Active: Not on file   Other Topics Concern  . Not on file   Social History Narrative  . No narrative on file    Current Outpatient Prescriptions on File Prior to Visit  Medication Sig Dispense Refill  . aspirin 81 MG tablet Take 81 mg by mouth daily.        Marland Kitchen azelastine (OPTIVAR) 0.05 % ophthalmic solution Place 1 drop into both eyes 2 (two) times daily.        . DULoxetine (CYMBALTA) 60 MG capsule Take 60 mg by mouth 2 (two) times daily.        Marland Kitchen KLOR-CON M20 20 MEQ tablet EVERY DAY  30 tablet  3  . latanoprost (XALATAN) 0.005 % ophthalmic solution Place 1 drop into both eyes 2 (two) times daily.        Marland Kitchen lisinopril (PRINIVIL,ZESTRIL) 20 MG tablet Take 20 mg by mouth daily.        .  metFORMIN (GLUCOPHAGE-XR) 500 MG 24 hr tablet Take 500 mg by mouth daily with breakfast.       . methocarbamol (ROBAXIN) 750 MG tablet Take 750 mg by mouth daily as needed.        . metoprolol succinate (TOPROL-XL) 50 MG 24 hr tablet Take 1 tablet (50 mg total) by mouth daily. Take 1/2 tablet by mouth once daily  90 tablet  3  . nitroGLYCERIN (NITROSTAT) 0.4 MG SL tablet Place 0.4 mg under the tongue every 5 (five) minutes as needed.        . Oxycodone HCl (OXYCONTIN) 60 MG TB12 Take 1 tablet by mouth every 4 (four) hours as needed.        Marland Kitchen oxyCODONE-acetaminophen (PERCOCET) 10-325 MG per tablet Take 1 tablet by mouth daily. Dr Vear Clock       . amoxicillin (AMOXIL) 875 MG tablet Take 875 mg by mouth 2 (two) times daily.       . chlorhexidine (PERIDEX) 0.12 % solution As directed      . simvastatin (ZOCOR) 80 MG tablet Take 1 tablet (80 mg total) by mouth daily.  30 tablet  3  . triamcinolone cream (KENALOG) 0.1 %  Apply topically 2 (two) times daily.  30 g  0  . triamterene-hydrochlorothiazide (DYAZIDE) 37.5-25 MG per capsule Take 1 each (1 capsule total) by mouth every morning.  90 capsule  1   No current facility-administered medications on file prior to visit.    Allergies  Allergen Reactions  . Bupropion Hcl Other (See Comments)    "messes with my head"  . Doxycycline Hyclate Diarrhea       . Tetracycline Diarrhea  . Verapamil Other (See Comments)    "messes with my head"    Family History  Problem Relation Age of Onset  . Cancer Mother     uncertain type    BP 132/70  Ht 5\' 5"  (1.651 m)  Wt 178 lb (80.74 kg)  BMI 29.62 kg/m2  SpO2 97%  Review of Systems denies hypoglycemia and weight change    Objective:   Physical Exam VITAL SIGNS:  See vs page GENERAL: no distress      Assessment & Plan:  DM: apparently well-controlled. there are nine oral agents available for type 2 diabetes.  This regimen gives the best risk-benefit ratio.

## 2013-02-05 ENCOUNTER — Telehealth: Payer: Self-pay

## 2013-02-05 NOTE — Telephone Encounter (Signed)
Pt would like to know if you are going to adjust his meds based on lab results from Dr.Elkins, please advise [phone]

## 2013-02-05 NOTE — Telephone Encounter (Signed)
i got the results.  They're good.  No need to add other meds.  Please continue the same medications.  i'll see you next time.

## 2013-02-05 NOTE — Telephone Encounter (Signed)
Pt advised and states an understanding 

## 2013-02-26 ENCOUNTER — Encounter: Payer: Self-pay | Admitting: Family Medicine

## 2013-03-08 ENCOUNTER — Telehealth: Payer: Self-pay | Admitting: *Deleted

## 2013-03-08 MED ORDER — LISINOPRIL 20 MG PO TABS: 20.0000 mg | ORAL_TABLET | Freq: Every day | ORAL | Status: DC

## 2013-03-08 MED ORDER — SIMVASTATIN 80 MG PO TABS: 80.0000 mg | ORAL_TABLET | Freq: Every day | ORAL | Status: DC

## 2013-03-08 NOTE — Telephone Encounter (Signed)
I spoke with pt and he is taking both zocor & lisinopril. Pt states he had an episode of angina which was relieved by 3 sl NTG. Pt stated " I felt like I had a small heart attack... I did not want to go to the hospital because I had a bad experience at [facility].."  Pt has been painfree since that anginal episode. He will call back to make appointment.  Advised to call EMS if experienced unrelieved. Pt reiterated that he did not want to go to [facility]. I talked with him & reassured him.   Mylo Red RN

## 2013-04-19 ENCOUNTER — Telehealth: Payer: Self-pay | Admitting: Internal Medicine

## 2013-04-19 NOTE — Telephone Encounter (Signed)
Pt is scheduled for app with dr Tenny Craw 05/10/13 Pt is needing cardiac clearance for a cervical spine surgery. Surgery has not been scheduled yet, pt is to have cervical MRI 04/26/13. Pt asking if he can be seen sooner so he can be scheduled? Please advise.

## 2013-04-19 NOTE — Telephone Encounter (Signed)
New problem   Pt need to speak to nurse concerning up coming sx he will be having for cervical fusion, please call pt.

## 2013-04-22 NOTE — Telephone Encounter (Signed)
Add patient on to my clinic on Thurs at 12:15

## 2013-04-23 NOTE — Telephone Encounter (Signed)
Left message for pt to call.

## 2013-04-25 ENCOUNTER — Encounter: Payer: Self-pay | Admitting: Internal Medicine

## 2013-04-25 ENCOUNTER — Ambulatory Visit (INDEPENDENT_AMBULATORY_CARE_PROVIDER_SITE_OTHER): Payer: Medicare Other | Admitting: Internal Medicine

## 2013-04-25 VITALS — BP 144/82 | HR 65 | Ht 66.0 in | Wt 175.0 lb

## 2013-04-25 DIAGNOSIS — I2510 Atherosclerotic heart disease of native coronary artery without angina pectoris: Secondary | ICD-10-CM

## 2013-04-25 DIAGNOSIS — R0989 Other specified symptoms and signs involving the circulatory and respiratory systems: Secondary | ICD-10-CM

## 2013-04-25 DIAGNOSIS — R0609 Other forms of dyspnea: Secondary | ICD-10-CM

## 2013-04-25 DIAGNOSIS — R609 Edema, unspecified: Secondary | ICD-10-CM

## 2013-04-25 DIAGNOSIS — R0600 Dyspnea, unspecified: Secondary | ICD-10-CM

## 2013-04-25 LAB — BASIC METABOLIC PANEL
BUN: 8 mg/dL (ref 6–23)
Creatinine, Ser: 0.8 mg/dL (ref 0.4–1.5)
GFR: 106.14 mL/min (ref >60.00)
Glucose, Bld: 99 mg/dL (ref 70–99)
Potassium: 3.5 mEq/L (ref 3.5–5.1)

## 2013-04-25 NOTE — Patient Instructions (Signed)
You will need lab work today:  BMP, BNP We will call you with your results  Your physician has requested that you have an echocardiogram. Echocardiography is a painless test that uses sound waves to create images of your heart. It provides your doctor with information about the size and shape of your heart and how well your heart's chambers and valves are working. This procedure takes approximately one hour. There are no restrictions for this procedure.  Your physician wants you to follow-up in: 6 months with Dr. Tenny Craw.  You will receive a reminder letter in the mail two months in advance. If you don't receive a letter, please call our office to schedule the follow-up appointment.   Your physician recommends that you continue on your current medications as directed. Please refer to the Current Medication list given to you today.

## 2013-04-25 NOTE — Progress Notes (Signed)
HPI [name] is a -year-old gentleman with a history  of CAD (status post PTCA/stent (DES) to the circumflex and LAD in 2005) I saw him in clinic in May 2013 Patient is being evaluated for neck surgery. He notes remote CP  Walks some  Does house work/yard work  Neck does limit activity   Had swelling in LE and lasix was adjusted  Denies SOB  Allergies  Allergen Reactions  . Bupropion Hcl Other (See Comments)    "messes with my head"  . Doxycycline Hyclate Diarrhea       . Tetracycline Diarrhea  . Verapamil Other (See Comments)    "messes with my head"    Current Outpatient Prescriptions  Medication Sig Dispense Refill  . aspirin 81 MG tablet Take 81 mg by mouth daily.        Marland Kitchen azelastine (OPTIVAR) 0.05 % ophthalmic solution Place 1 drop into both eyes 2 (two) times daily.        . DULoxetine (CYMBALTA) 60 MG capsule Take 60 mg by mouth 2 (two) times daily.        Marland Kitchen esomeprazole (NEXIUM) 40 MG capsule Take 40 mg by mouth 2 (two) times daily.      . Eszopiclone (ESZOPICLONE) 3 MG TABS Take 3 mg by mouth at bedtime. Take immediately before bedtime      . furosemide (LASIX) 40 MG tablet TAKE 2 TABLET EVERY 12 HOURS      . gabapentin (NEURONTIN) 600 MG tablet Take 600 mg by mouth 3 (three) times daily.      Marland Kitchen glucose blood (ACCU-CHEK AVIVA PLUS) test strip 1 each by Other route daily. And lancets 1/day 250.00  100 each  12  . KLOR-CON M20 20 MEQ tablet EVERY DAY  30 tablet  3  . latanoprost (XALATAN) 0.005 % ophthalmic solution Place 1 drop into both eyes 2 (two) times daily.        Marland Kitchen lisinopril (PRINIVIL,ZESTRIL) 20 MG tablet Take 1 tablet (20 mg total) by mouth daily.  90 tablet  3  . metFORMIN (GLUCOPHAGE) 1000 MG tablet TAKE 1/2 TABLET IN THE MORNING AND 1/2 TABLET IN THE EVENING      . methocarbamol (ROBAXIN) 750 MG tablet Take 750 mg by mouth daily as needed.        . metoprolol succinate (TOPROL-XL) 50 MG 24 hr tablet Take 1/2 tablet by mouth once daily      . nitroGLYCERIN  (NITROSTAT) 0.4 MG SL tablet Place 0.4 mg under the tongue every 5 (five) minutes as needed.        . Oxycodone HCl (OXYCONTIN) 60 MG TB12 Take 1 tablet by mouth every 4 (four) hours as needed.        Marland Kitchen oxyCODONE-acetaminophen (PERCOCET) 10-325 MG per tablet Take 1 tablet by mouth daily. Dr Vear Clock       . simvastatin (ZOCOR) 80 MG tablet Take 1 tablet (80 mg total) by mouth daily.  90 tablet  3  . Triamterene-HCTZ (MAXZIDE-25 PO) Take by mouth daily.       No current facility-administered medications for this visit.    Past Medical History  Diagnosis Date  . DIABETES MELLITUS, TYPE II 05/02/2007  . HYPERCHOLESTEROLEMIA 10/27/2009  . DEPRESSION 10/27/2009  . HYPERTENSION 05/02/2007  . CORONARY ARTERY DISEASE 05/02/2007  . ALLERGIC RHINITIS 05/02/2007  . ESOPHAGEAL STRICTURE 12/10/2007  . GERD 12/10/2007  . OSTEOARTHRITIS, LUMBAR SPINE 10/27/2009  . DUODENITIS 12/10/2007  . TRANSAMINASES, SERUM, ELEVATED 12/10/2007  . Insomnia  Past Surgical History  Procedure Laterality Date  . Esophagogastroduodenoscopy  03/20/2007  . Bilateral vats ablation  2011    Family History  Problem Relation Age of Onset  . Cancer Mother     uncertain type    History   Social History  . Marital Status: Married    Spouse Name: [name]    Number of Children: [name]  . Years of Education: [name]   Occupational History  . Disabled    Social History Main Topics  . Smoking status: Current Every Day Smoker -- 2.00 packs/day for 56 years    Types: Cigarettes  . Smokeless tobacco: Never Used  . Alcohol Use: No  . Drug Use: Not on file  . Sexual Activity: Not on file   Other Topics Concern  . Not on file   Social History Narrative  . No narrative on file    Review of Systems:  All systems reviewed.  They are negative to the above problem except as previously stated.  Vital Signs: BP 144/82  Pulse 65  Ht 5\' 6"  (1.676 m)  Wt 175 lb (79.379 kg)  BMI 28.26 kg/m2  SpO2 96%  Physical Exam Patient is in  NAD HEENT:  Normocephalic, atraumatic. EOMI, PERRLA.  Neck: JVP is normal.  No bruits.  Lungs: clear to auscultation. No rales no wheezes.  Heart: Regular rate and rhythm. Normal S1, S2. No S3.   No significant murmurs. PMI not displaced.  Abdomen:  Supple, nontender. Normal bowel sounds. No masses. No hepatomegaly.  Extremities:   Good distal pulses throughout. 1+ lower extremity edema.  Musculoskeletal :moving all extremities.  Neuro:   alert and oriented x3.  CN II-XII grossly intact.  EKG  SR 65 RBBB.   Assessment and Plan:  1.  CAD  I am not convinced of active ischemia  He does have LE edema  Will get labs and echo to evaluate   2.  HL  Will need to get fasting labs    3.  Edema  Labs pending  4.  HL  Continue statin.  Check lipids  From a cardiac standpoint I think he is a rel low risk for major cardiac event in periop period.  Watch volume status (I/O).  Will review echo.

## 2013-04-29 ENCOUNTER — Other Ambulatory Visit: Payer: Self-pay | Admitting: *Deleted

## 2013-04-30 ENCOUNTER — Telehealth: Payer: Self-pay | Admitting: *Deleted

## 2013-04-30 NOTE — Telephone Encounter (Signed)
LM FOR PT TO CALL BACK RE  NEW RECOMMENDATIONS ON LAST  LABS PER DR ROSS.

## 2013-05-02 ENCOUNTER — Ambulatory Visit (HOSPITAL_COMMUNITY): Payer: Medicare Other | Attending: Cardiology | Admitting: Radiology

## 2013-05-02 DIAGNOSIS — I10 Essential (primary) hypertension: Secondary | ICD-10-CM | POA: Insufficient documentation

## 2013-05-02 DIAGNOSIS — E7800 Pure hypercholesterolemia, unspecified: Secondary | ICD-10-CM | POA: Insufficient documentation

## 2013-05-02 DIAGNOSIS — I2510 Atherosclerotic heart disease of native coronary artery without angina pectoris: Secondary | ICD-10-CM

## 2013-05-02 DIAGNOSIS — F17200 Nicotine dependence, unspecified, uncomplicated: Secondary | ICD-10-CM | POA: Insufficient documentation

## 2013-05-02 DIAGNOSIS — R609 Edema, unspecified: Secondary | ICD-10-CM

## 2013-05-02 DIAGNOSIS — E119 Type 2 diabetes mellitus without complications: Secondary | ICD-10-CM | POA: Insufficient documentation

## 2013-05-02 NOTE — Progress Notes (Signed)
Echocardiogram performed.  

## 2013-05-07 NOTE — Telephone Encounter (Signed)
Pt seen as requested

## 2013-05-09 ENCOUNTER — Other Ambulatory Visit: Payer: Self-pay | Admitting: Family Medicine

## 2013-05-09 DIAGNOSIS — R109 Unspecified abdominal pain: Secondary | ICD-10-CM

## 2013-05-10 ENCOUNTER — Encounter (HOSPITAL_COMMUNITY): Payer: Self-pay | Admitting: Pharmacy Technician

## 2013-05-10 ENCOUNTER — Ambulatory Visit
Admission: RE | Admit: 2013-05-10 | Discharge: 2013-05-10 | Disposition: A | Discharge status: Home / Self Care | Payer: Medicare Other | Source: Ambulatory Visit | Attending: Family Medicine | Admitting: Family Medicine

## 2013-05-10 ENCOUNTER — Ambulatory Visit: Payer: Medicare Other | Admitting: Internal Medicine

## 2013-05-10 DIAGNOSIS — R109 Unspecified abdominal pain: Secondary | ICD-10-CM

## 2013-05-10 MED ORDER — IOHEXOL 300 MG/ML  SOLN
100.0000 mL | Freq: Once | INTRAMUSCULAR | Status: AC | PRN
  Administered 2013-05-10: 100 mL via INTRAVENOUS

## 2013-05-16 ENCOUNTER — Encounter (HOSPITAL_COMMUNITY)
Admission: RE | Admit: 2013-05-16 | Discharge: 2013-05-16 | Disposition: A | Discharge status: Home / Self Care | Payer: Medicare Other | Source: Ambulatory Visit | Attending: Orthopedic Surgery | Admitting: Orthopedic Surgery

## 2013-05-16 ENCOUNTER — Encounter (HOSPITAL_COMMUNITY): Payer: Self-pay

## 2013-05-16 DIAGNOSIS — Z01818 Encounter for other preprocedural examination: Secondary | ICD-10-CM | POA: Insufficient documentation

## 2013-05-16 DIAGNOSIS — Z01812 Encounter for preprocedural laboratory examination: Secondary | ICD-10-CM | POA: Insufficient documentation

## 2013-05-16 HISTORY — DX: Acute myocardial infarction, unspecified: I21.9

## 2013-05-16 HISTORY — DX: Calculus of gallbladder without cholecystitis without obstruction: K80.20

## 2013-05-16 HISTORY — DX: Pneumonia, unspecified organism: J18.9

## 2013-05-16 HISTORY — DX: Personal history of other diseases of the digestive system: Z87.19

## 2013-05-16 HISTORY — DX: Headache: R51

## 2013-05-16 HISTORY — DX: Cardiac murmur, unspecified: R01.1

## 2013-05-16 LAB — BASIC METABOLIC PANEL
BUN: 5 mg/dL — ABNORMAL LOW (ref 6–23)
CO2: 28 mEq/L (ref 19–32)
Chloride: 103 mEq/L (ref 96–112)
Creatinine, Ser: 0.86 mg/dL (ref 0.50–1.35)
Potassium: 3.7 mEq/L (ref 3.5–5.1)

## 2013-05-16 LAB — CBC
HCT: 36.3 % — ABNORMAL LOW (ref 39.0–52.0)
Hemoglobin: 12.8 g/dL — ABNORMAL LOW (ref 13.0–17.0)
MCHC: 35.3 g/dL (ref 30.0–36.0)
MCV: 98.4 fL (ref 78.0–100.0)
RDW: 13.1 % (ref 11.5–15.5)
WBC: 6.1 10*3/uL (ref 4.0–10.5)

## 2013-05-16 NOTE — Progress Notes (Signed)
05/16/13 1426  OBSTRUCTIVE SLEEP APNEA  Have you ever been diagnosed with sleep apnea through a sleep study? No  Do you snore loudly (loud enough to be heard through closed doors)?  0  Do you often feel tired, fatigued, or sleepy during the daytime? 1  Has anyone observed you stop breathing during your sleep? 0  Do you have, or are you being treated for high blood pressure? 1  BMI more than 35 kg/m2? 0  Age over 65 years old? 1  Neck circumference greater than 40 cm/18 inches? 0  Gender: 1  Obstructive Sleep Apnea Score 4  Score 4 or greater  Results sent to PCP

## 2013-05-16 NOTE — Progress Notes (Addendum)
Pcp is Dr Dr. Kaleen Mask   Cardiologist is Dr Dietrich Pates   Pt instructed to bring back brace the morning of surgery. Voices understanding.  Pt states that he had a cxr a few months ago fax sent to request results from Dr Jeannetta Nap.  Echo noted 05-02-13 in epic denies recent card cath or stress test.    Request faxed to Dr Tenny Craw at [facility] for stress test and card cath.

## 2013-05-17 NOTE — Progress Notes (Signed)
Anesthesia Chart Review:  Patient is a 67 year old male scheduled for C5-6 diskectomy and fusion on 05/22/13 by Dr. Shon Baton.  History includes CAD/MI s/p DES CX and LAD '05, smoking, DM2, hypercholesterolemia, HTN, heart murmur, GERD, hiatal hernia, depression, right empyema s/p VATS/decortication '11.  OSA screen is 4. PCP is Dr. Windle Guard.  Endocrinologist is Dr. Everardo All.  Cardiologist is Dr. Dietrich Pates.  She is aware of plans for surgery and felt he was low risk for major perioperative cardiac event.  EKG on 04/25/13 showed NSR, right BBB, possible inferior infarct (age undetermined).  Echo on 05/02/13 showed:  - Left ventricle: The cavity size was normal. Wall thickness was increased in a pattern of mild LVH. Systolic function was normal. The estimated ejection fraction was in the range of 60% to 65%. Wall motion was normal; there were no regional wall motion abnormalities. Left ventricular diastolic function parameters were normal. - Mitral valve: Calcified annulus. - Left atrium: The atrium was mildly dilated. - Right ventricle: The cavity size was mildly dilated.  Cardiac cath on 06/05/08 showed: 1. Diffuse nonobstructive coronary artery disease.  2. Widely patent stents in the left anterior descending and left circumflex.  3. Normal left ventricular function. EF 50-55%. 4. Elevated left ventricular end-diastolic pressure.  CXR from Dr. Hennie Duos office is still pending. Nursing staff to follow-up.  If > 49 year old then he will need a CXR repeated on the day of surgery.  Preoperative labs noted.   Patient had recent cardiology evaluation and felt to be low risk.  If no acute changes then I would anticipate that he could proceed as planned.  Velna Ochs [facility]/Anesthesiology Phone [phone] 05/17/2013 10:27 AM

## 2013-05-20 NOTE — Progress Notes (Signed)
[facility] called and spoke with the Medical Records dept to follow up on the info requested.  His chart still needs to be located and then they will fax over the info. Per med records dept.  (reuested CXR).

## 2013-05-21 MED ORDER — DEXAMETHASONE SODIUM PHOSPHATE 4 MG/ML IJ SOLN
4.0000 mg | Freq: Once | INTRAMUSCULAR | Status: AC
  Administered 2013-05-22: 4 mg via INTRAVENOUS
  Filled 2013-05-21: qty 1

## 2013-05-21 MED ORDER — ACETAMINOPHEN 10 MG/ML IV SOLN
1000.0000 mg | Freq: Four times a day (QID) | INTRAVENOUS | Status: DC
  Administered 2013-05-22: 1000 mg via INTRAVENOUS

## 2013-05-21 MED ORDER — CEFAZOLIN SODIUM-DEXTROSE 2-3 GM-% IV SOLR
2.0000 g | INTRAVENOUS | Status: AC
  Administered 2013-05-22: 2 g via INTRAVENOUS
  Filled 2013-05-21: qty 50

## 2013-05-21 NOTE — Progress Notes (Signed)
Spoke again to Dr Jeannetta Nap office and they said they will refax the CXR

## 2013-05-22 ENCOUNTER — Encounter (HOSPITAL_COMMUNITY): Payer: Self-pay | Admitting: Vascular Surgery

## 2013-05-22 ENCOUNTER — Ambulatory Visit (HOSPITAL_COMMUNITY): Payer: Medicare Other

## 2013-05-22 ENCOUNTER — Encounter (HOSPITAL_COMMUNITY)
Admission: RE | Disposition: A | Discharge status: Home / Self Care | Payer: Self-pay | Source: Ambulatory Visit | Attending: Orthopedic Surgery

## 2013-05-22 ENCOUNTER — Inpatient Hospital Stay (HOSPITAL_COMMUNITY)
Admission: RE | Admit: 2013-05-22 | Discharge: 2013-05-22 | DRG: 473 | Disposition: A | Discharge status: Home / Self Care | Payer: Medicare Other | Source: Ambulatory Visit | Attending: Orthopedic Surgery | Admitting: Orthopedic Surgery

## 2013-05-22 ENCOUNTER — Ambulatory Visit (HOSPITAL_COMMUNITY): Payer: Medicare Other | Admitting: Critical Care Medicine

## 2013-05-22 ENCOUNTER — Encounter (HOSPITAL_COMMUNITY): Payer: Self-pay | Admitting: Critical Care Medicine

## 2013-05-22 ENCOUNTER — Observation Stay (HOSPITAL_COMMUNITY): Payer: Medicare Other

## 2013-05-22 DIAGNOSIS — F329 Major depressive disorder, single episode, unspecified: Secondary | ICD-10-CM | POA: Diagnosis present

## 2013-05-22 DIAGNOSIS — M5020 Other cervical disc displacement, unspecified cervical region: Principal | ICD-10-CM | POA: Diagnosis present

## 2013-05-22 DIAGNOSIS — F17200 Nicotine dependence, unspecified, uncomplicated: Secondary | ICD-10-CM | POA: Diagnosis present

## 2013-05-22 DIAGNOSIS — Z981 Arthrodesis status: Secondary | ICD-10-CM

## 2013-05-22 DIAGNOSIS — I252 Old myocardial infarction: Secondary | ICD-10-CM

## 2013-05-22 DIAGNOSIS — Z9861 Coronary angioplasty status: Secondary | ICD-10-CM

## 2013-05-22 DIAGNOSIS — M47812 Spondylosis without myelopathy or radiculopathy, cervical region: Secondary | ICD-10-CM | POA: Diagnosis present

## 2013-05-22 DIAGNOSIS — E119 Type 2 diabetes mellitus without complications: Secondary | ICD-10-CM | POA: Diagnosis present

## 2013-05-22 DIAGNOSIS — I2510 Atherosclerotic heart disease of native coronary artery without angina pectoris: Secondary | ICD-10-CM | POA: Diagnosis present

## 2013-05-22 DIAGNOSIS — F3289 Other specified depressive episodes: Secondary | ICD-10-CM | POA: Diagnosis present

## 2013-05-22 DIAGNOSIS — I10 Essential (primary) hypertension: Secondary | ICD-10-CM | POA: Diagnosis present

## 2013-05-22 DIAGNOSIS — K449 Diaphragmatic hernia without obstruction or gangrene: Secondary | ICD-10-CM | POA: Diagnosis present

## 2013-05-22 DIAGNOSIS — K219 Gastro-esophageal reflux disease without esophagitis: Secondary | ICD-10-CM | POA: Diagnosis present

## 2013-05-22 HISTORY — PX: ANTERIOR CERVICAL DECOMP/DISCECTOMY FUSION: SHX1161

## 2013-05-22 LAB — GLUCOSE, CAPILLARY: Glucose-Capillary: 123 mg/dL — ABNORMAL HIGH (ref 70–99)

## 2013-05-22 SURGERY — ANTERIOR CERVICAL DECOMPRESSION/DISCECTOMY FUSION 1 LEVEL
Anesthesia: General | Site: Neck | Wound class: Clean

## 2013-05-22 MED ORDER — LISINOPRIL 20 MG PO TABS: 20.0000 mg | ORAL_TABLET | Freq: Every day | ORAL | Status: DC

## 2013-05-22 MED ORDER — ACETAMINOPHEN 10 MG/ML IV SOLN
1000.0000 mg | Freq: Four times a day (QID) | INTRAVENOUS | Status: DC
  Administered 2013-05-22: 1000 mg via INTRAVENOUS
  Filled 2013-05-22 (×3): qty 100

## 2013-05-22 MED ORDER — PANTOPRAZOLE SODIUM 40 MG PO TBEC: 40.0000 mg | DELAYED_RELEASE_TABLET | Freq: Every day | ORAL | Status: DC

## 2013-05-22 MED ORDER — PHENOL 1.4 % MT LIQD: 1.0000 | OROMUCOSAL | Status: DC | PRN

## 2013-05-22 MED ORDER — SODIUM CHLORIDE 0.9 % IJ SOLN: 3.0000 mL | Freq: Two times a day (BID) | INTRAMUSCULAR | Status: DC

## 2013-05-22 MED ORDER — LACTATED RINGERS IV SOLN: INTRAVENOUS | Status: DC

## 2013-05-22 MED ORDER — FLEET ENEMA 7-19 GM/118ML RE ENEM
1.0000 | ENEMA | Freq: Once | RECTAL | Status: DC | PRN
  Filled 2013-05-22: qty 1

## 2013-05-22 MED ORDER — CEFAZOLIN SODIUM 1-5 GM-% IV SOLN
1.0000 g | Freq: Three times a day (TID) | INTRAVENOUS | Status: DC
  Administered 2013-05-22: 1 g via INTRAVENOUS
  Filled 2013-05-22 (×2): qty 50

## 2013-05-22 MED ORDER — NEOSTIGMINE METHYLSULFATE 1 MG/ML IJ SOLN
INTRAMUSCULAR | Status: DC | PRN
  Administered 2013-05-22: 4 mg via INTRAVENOUS

## 2013-05-22 MED ORDER — POTASSIUM CHLORIDE CRYS ER 20 MEQ PO TBCR
30.0000 meq | EXTENDED_RELEASE_TABLET | Freq: Two times a day (BID) | ORAL | Status: DC
  Filled 2013-05-22: qty 1

## 2013-05-22 MED ORDER — ONDANSETRON HCL 4 MG/2ML IJ SOLN
INTRAMUSCULAR | Status: DC | PRN
  Administered 2013-05-22: 4 mg via INTRAVENOUS

## 2013-05-22 MED ORDER — METOCLOPRAMIDE HCL 5 MG/ML IJ SOLN: 10.0000 mg | Freq: Once | INTRAMUSCULAR | Status: DC | PRN

## 2013-05-22 MED ORDER — MIDAZOLAM HCL 5 MG/5ML IJ SOLN
INTRAMUSCULAR | Status: DC | PRN
  Administered 2013-05-22: 1 mg via INTRAVENOUS

## 2013-05-22 MED ORDER — METHOCARBAMOL 500 MG PO TABS
ORAL_TABLET | ORAL | Status: AC
  Filled 2013-05-22: qty 1

## 2013-05-22 MED ORDER — GLYCOPYRROLATE 0.2 MG/ML IJ SOLN
INTRAMUSCULAR | Status: DC | PRN
  Administered 2013-05-22: 0.6 mg via INTRAVENOUS

## 2013-05-22 MED ORDER — PROPOFOL 10 MG/ML IV BOLUS
INTRAVENOUS | Status: DC | PRN
  Administered 2013-05-22: 140 mg via INTRAVENOUS

## 2013-05-22 MED ORDER — SUCCINYLCHOLINE CHLORIDE 20 MG/ML IJ SOLN
INTRAMUSCULAR | Status: DC | PRN
  Administered 2013-05-22: 100 mg via INTRAVENOUS

## 2013-05-22 MED ORDER — ROCURONIUM BROMIDE 100 MG/10ML IV SOLN
INTRAVENOUS | Status: DC | PRN
  Administered 2013-05-22: 10 mg via INTRAVENOUS
  Administered 2013-05-22: 40 mg via INTRAVENOUS

## 2013-05-22 MED ORDER — OXYCODONE HCL 5 MG/5ML PO SOLN: 5.0000 mg | Freq: Once | ORAL | Status: DC | PRN

## 2013-05-22 MED ORDER — 0.9 % SODIUM CHLORIDE (POUR BTL) OPTIME
TOPICAL | Status: DC | PRN
  Administered 2013-05-22: 1000 mL

## 2013-05-22 MED ORDER — ONDANSETRON 4 MG PO TBDP: 4.0000 mg | ORAL_TABLET | Freq: Three times a day (TID) | ORAL | Status: DC | PRN

## 2013-05-22 MED ORDER — POLYETHYLENE GLYCOL 3350 17 GM/SCOOP PO POWD: 17.0000 g | Freq: Every day | ORAL | Status: DC

## 2013-05-22 MED ORDER — METHOCARBAMOL 500 MG PO TABS
500.0000 mg | ORAL_TABLET | Freq: Four times a day (QID) | ORAL | Status: DC | PRN
  Administered 2013-05-22: 500 mg via ORAL
  Filled 2013-05-22: qty 1

## 2013-05-22 MED ORDER — THROMBIN 20000 UNITS EX SOLR
CUTANEOUS | Status: AC
  Filled 2013-05-22: qty 20000

## 2013-05-22 MED ORDER — ATORVASTATIN CALCIUM 40 MG PO TABS
40.0000 mg | ORAL_TABLET | Freq: Every day | ORAL | Status: DC
  Administered 2013-05-22: 40 mg via ORAL
  Filled 2013-05-22: qty 1

## 2013-05-22 MED ORDER — HYDROMORPHONE HCL PF 1 MG/ML IJ SOLN: 0.2500 mg | INTRAMUSCULAR | Status: DC | PRN

## 2013-05-22 MED ORDER — ZOLPIDEM TARTRATE 5 MG PO TABS: 5.0000 mg | ORAL_TABLET | Freq: Every evening | ORAL | Status: DC | PRN

## 2013-05-22 MED ORDER — OXYCODONE HCL 5 MG PO TABS
ORAL_TABLET | ORAL | Status: AC
  Filled 2013-05-22: qty 2

## 2013-05-22 MED ORDER — METHOCARBAMOL 500 MG PO TABS: 500.0000 mg | ORAL_TABLET | Freq: Four times a day (QID) | ORAL | Status: DC | PRN

## 2013-05-22 MED ORDER — SODIUM CHLORIDE 0.9 % IJ SOLN: 3.0000 mL | INTRAMUSCULAR | Status: DC | PRN

## 2013-05-22 MED ORDER — POTASSIUM CHLORIDE CRYS ER 20 MEQ PO TBCR: 20.0000 meq | EXTENDED_RELEASE_TABLET | Freq: Every day | ORAL | Status: DC

## 2013-05-22 MED ORDER — OXYCODONE-ACETAMINOPHEN 10-325 MG PO TABS: 1.0000 | ORAL_TABLET | ORAL | Status: DC | PRN

## 2013-05-22 MED ORDER — METFORMIN HCL 500 MG PO TABS
500.0000 mg | ORAL_TABLET | Freq: Two times a day (BID) | ORAL | Status: DC
  Filled 2013-05-22 (×2): qty 1

## 2013-05-22 MED ORDER — GABAPENTIN 600 MG PO TABS
600.0000 mg | ORAL_TABLET | Freq: Three times a day (TID) | ORAL | Status: DC
  Administered 2013-05-22: 600 mg via ORAL
  Filled 2013-05-22 (×2): qty 1

## 2013-05-22 MED ORDER — LACTATED RINGERS IV SOLN
INTRAVENOUS | Status: DC | PRN
  Administered 2013-05-22 (×2): via INTRAVENOUS

## 2013-05-22 MED ORDER — OXYCODONE HCL 5 MG PO TABS
10.0000 mg | ORAL_TABLET | ORAL | Status: DC | PRN
  Administered 2013-05-22 (×2): 10 mg via ORAL
  Filled 2013-05-22: qty 2

## 2013-05-22 MED ORDER — DOCUSATE SODIUM 100 MG PO CAPS: 100.0000 mg | ORAL_CAPSULE | Freq: Two times a day (BID) | ORAL | Status: DC

## 2013-05-22 MED ORDER — METOPROLOL SUCCINATE ER 25 MG PO TB24
25.0000 mg | ORAL_TABLET | Freq: Every day | ORAL | Status: DC
  Filled 2013-05-22: qty 1

## 2013-05-22 MED ORDER — LIDOCAINE HCL (CARDIAC) 20 MG/ML IV SOLN
INTRAVENOUS | Status: DC | PRN
  Administered 2013-05-22: 60 mg via INTRAVENOUS

## 2013-05-22 MED ORDER — FENTANYL CITRATE 0.05 MG/ML IJ SOLN
INTRAMUSCULAR | Status: DC | PRN
  Administered 2013-05-22: 100 ug via INTRAVENOUS
  Administered 2013-05-22: 50 ug via INTRAVENOUS
  Administered 2013-05-22: 100 ug via INTRAVENOUS

## 2013-05-22 MED ORDER — METHOCARBAMOL 100 MG/ML IJ SOLN
500.0000 mg | Freq: Four times a day (QID) | INTRAVENOUS | Status: DC | PRN
  Filled 2013-05-22: qty 5

## 2013-05-22 MED ORDER — DULOXETINE HCL 60 MG PO CPEP
60.0000 mg | ORAL_CAPSULE | ORAL | Status: DC
  Filled 2013-05-22 (×2): qty 1

## 2013-05-22 MED ORDER — LATANOPROST 0.005 % OP SOLN
1.0000 [drp] | Freq: Every day | OPHTHALMIC | Status: DC
  Filled 2013-05-22: qty 2.5

## 2013-05-22 MED ORDER — NITROGLYCERIN 0.4 MG SL SUBL
0.4000 mg | SUBLINGUAL_TABLET | SUBLINGUAL | Status: DC | PRN
Start: 2013-05-22 — End: 2013-05-22

## 2013-05-22 MED ORDER — FUROSEMIDE 20 MG PO TABS
20.0000 mg | ORAL_TABLET | Freq: Two times a day (BID) | ORAL | Status: DC
Start: 2013-05-22 — End: 2013-05-22
  Administered 2013-05-22: 20 mg via ORAL
  Filled 2013-05-22 (×2): qty 1

## 2013-05-22 MED ORDER — MENTHOL 3 MG MT LOZG: 1.0000 | LOZENGE | OROMUCOSAL | Status: DC | PRN

## 2013-05-22 MED ORDER — ACETAMINOPHEN 10 MG/ML IV SOLN
INTRAVENOUS | Status: AC
  Filled 2013-05-22: qty 100

## 2013-05-22 MED ORDER — INSULIN ASPART 100 UNIT/ML ~~LOC~~ SOLN
0.0000 [IU] | SUBCUTANEOUS | Status: DC
  Administered 2013-05-22: 2 [IU] via SUBCUTANEOUS

## 2013-05-22 MED ORDER — THROMBIN 20000 UNITS EX SOLR
CUTANEOUS | Status: DC | PRN
  Administered 2013-05-22: 10:00:00 via TOPICAL

## 2013-05-22 MED ORDER — BUPIVACAINE-EPINEPHRINE PF 0.25-1:200000 % IJ SOLN
INTRAMUSCULAR | Status: AC
  Filled 2013-05-22: qty 30

## 2013-05-22 MED ORDER — MORPHINE SULFATE 2 MG/ML IJ SOLN
1.0000 mg | INTRAMUSCULAR | Status: DC | PRN
Start: 2013-05-22 — End: 2013-05-22

## 2013-05-22 MED ORDER — ONDANSETRON HCL 4 MG/2ML IJ SOLN: 4.0000 mg | INTRAMUSCULAR | Status: DC | PRN

## 2013-05-22 MED ORDER — OXYCODONE HCL 5 MG PO TABS: 5.0000 mg | ORAL_TABLET | Freq: Once | ORAL | Status: DC | PRN

## 2013-05-22 MED ORDER — BUPIVACAINE-EPINEPHRINE 0.25% -1:200000 IJ SOLN
INTRAMUSCULAR | Status: DC | PRN
  Administered 2013-05-22: 4 mL

## 2013-05-22 SURGICAL SUPPLY — 60 items
BLADE SURG ROTATE 9660 (MISCELLANEOUS) IMPLANT
BUR EGG ELITE 4.0 (BURR) IMPLANT
BUR MATCHSTICK NEURO 3.0 LAGG (BURR) IMPLANT
CANISTER SUCTION 2500CC (MISCELLANEOUS) ×2 IMPLANT
CLOTH BEACON ORANGE TIMEOUT ST (SAFETY) ×2 IMPLANT
CLSR STERI-STRIP ANTIMIC 1/2X4 (GAUZE/BANDAGES/DRESSINGS) ×2 IMPLANT
CORDS BIPOLAR (ELECTRODE) ×2 IMPLANT
COVER SURGICAL LIGHT HANDLE (MISCELLANEOUS) ×2 IMPLANT
CRADLE DONUT ADULT HEAD (MISCELLANEOUS) ×2 IMPLANT
DEVICE ENDSKLTN TC MED 8MM (Orthopedic Implant) ×1 IMPLANT
DRAPE C-ARM 42X72 X-RAY (DRAPES) ×2 IMPLANT
DRAPE POUCH INSTRU U-SHP 10X18 (DRAPES) ×2 IMPLANT
DRAPE SURG 17X23 STRL (DRAPES) ×2 IMPLANT
DRAPE U-SHAPE 47X51 STRL (DRAPES) ×2 IMPLANT
DRSG MEPILEX BORDER 4X4 (GAUZE/BANDAGES/DRESSINGS) ×2 IMPLANT
DURAPREP 26ML APPLICATOR (WOUND CARE) ×2 IMPLANT
ELECT COATED BLADE 2.86 ST (ELECTRODE) ×2 IMPLANT
ELECT REM PT RETURN 9FT ADLT (ELECTROSURGICAL) ×2
ELECTRODE REM PT RTRN 9FT ADLT (ELECTROSURGICAL) ×1 IMPLANT
ENDOSKELTON TC IMPLANT 8MM MED (Orthopedic Implant) ×2 IMPLANT
GLOVE BIOGEL PI IND STRL 8 (GLOVE) ×1 IMPLANT
GLOVE BIOGEL PI IND STRL 8.5 (GLOVE) ×1 IMPLANT
GLOVE BIOGEL PI INDICATOR 8 (GLOVE) ×1
GLOVE BIOGEL PI INDICATOR 8.5 (GLOVE) ×1
GLOVE ECLIPSE 8.5 STRL (GLOVE) ×2 IMPLANT
GLOVE ORTHO TXT STRL SZ7.5 (GLOVE) ×2 IMPLANT
GOWN PREVENTION PLUS XXLARGE (GOWN DISPOSABLE) ×2 IMPLANT
GOWN STRL REIN 2XL XLG LVL4 (GOWN DISPOSABLE) ×2 IMPLANT
GOWN STRL REIN XL XLG (GOWN DISPOSABLE) ×4 IMPLANT
KIT BASIN OR (CUSTOM PROCEDURE TRAY) ×2 IMPLANT
KIT ROOM TURNOVER OR (KITS) ×2 IMPLANT
NEEDLE SPNL 18GX3.5 QUINCKE PK (NEEDLE) ×2 IMPLANT
NS IRRIG 1000ML POUR BTL (IV SOLUTION) ×2 IMPLANT
PACK ORTHO CERVICAL (CUSTOM PROCEDURE TRAY) ×2 IMPLANT
PACK UNIVERSAL I (CUSTOM PROCEDURE TRAY) ×2 IMPLANT
PAD ARMBOARD 7.5X6 YLW CONV (MISCELLANEOUS) ×4 IMPLANT
PATTIES SURGICAL .25X.25 (GAUZE/BANDAGES/DRESSINGS) IMPLANT
PIN DISTRACTION 14 (PIN) ×2 IMPLANT
PIN RETAINER PRODISC 14 MM (PIN) ×2 IMPLANT
PLATE ONE LEVEL SKYLINE 14MM (Plate) ×2 IMPLANT
PUTTY BONE DBX 2.5 MIS (Bone Implant) ×2 IMPLANT
RESTRAINT LIMB HOLDER UNIV (RESTRAINTS) ×2 IMPLANT
SCREW SKYLINE 14MM SD-VA (Screw) ×6 IMPLANT
SCREW SKYLINE VAR OS 14MM (Screw) ×2 IMPLANT
SPONGE INTESTINAL PEANUT (DISPOSABLE) ×2 IMPLANT
SPONGE SURGIFOAM ABS GEL 100 (HEMOSTASIS) ×2 IMPLANT
SURGIFLO TRUKIT (HEMOSTASIS) IMPLANT
SUT MON AB 3-0 SH 27 (SUTURE) ×1
SUT MON AB 3-0 SH27 (SUTURE) ×1 IMPLANT
SUT SILK 2 0 (SUTURE)
SUT SILK 2-0 18XBRD TIE 12 (SUTURE) IMPLANT
SUT VIC AB 2-0 CT1 18 (SUTURE) ×2 IMPLANT
SYR BULB IRRIGATION 50ML (SYRINGE) ×2 IMPLANT
SYR CONTROL 10ML LL (SYRINGE) ×2 IMPLANT
TAPE CLOTH 4X10 WHT NS (GAUZE/BANDAGES/DRESSINGS) ×2 IMPLANT
TAPE UMBILICAL COTTON 1/8X30 (MISCELLANEOUS) ×2 IMPLANT
TOWEL OR 17X24 6PK STRL BLUE (TOWEL DISPOSABLE) ×2 IMPLANT
TOWEL OR 17X26 10 PK STRL BLUE (TOWEL DISPOSABLE) ×2 IMPLANT
WATER STERILE IRR 1000ML POUR (IV SOLUTION) IMPLANT
YANKAUER SUCT BULB TIP NO VENT (SUCTIONS) ×2 IMPLANT

## 2013-05-22 NOTE — Brief Op Note (Signed)
05/22/2013  10:32 AM  PATIENT:  [name]  67 y.o. 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  PROCEDURE:  Procedure(s): ANTERIOR CERVICAL DECOMPRESSION/DISCECTOMY FUSION 1 LEVEL C5-6 ([name])  SURGEON:  Surgeon(s) and Role:    * Venita Lick, MD - Primary  PHYSICIAN ASSISTANT:   ASSISTANTS: Zonia Kief   ANESTHESIA:   general  EBL:  Total I/O In: 1200 [I.V.:1200] Out: 50 [Blood:50]  BLOOD ADMINISTERED:none  DRAINS: none   LOCAL MEDICATIONS USED:  MARCAINE     SPECIMEN:  No Specimen  DISPOSITION OF SPECIMEN:  [name]  COUNTS:  YES  TOURNIQUET:  * No tourniquets in log *  DICTATION: .Other Dictation: Dictation Number [id number]  PLAN OF CARE: Admit for overnight observation  PATIENT DISPOSITION:  PACU - hemodynamically stable.

## 2013-05-22 NOTE — H&P (Signed)
History of Present Illness  The patient is a 67 year old male who presents today for follow up of their back. The patient is being followed for their back pain and neck pain. They are now 3 week(s) out from last visit. Symptoms reported today include: pain and stiffness. The patient states that they are doing poorly. Current treatment includes: NSAIDs and pain medications. The patient reports their current pain level to be severe. The patient presents today following MRI (04/26/13).    Subjective Transcription  67 year old white male who returns for follow up visit for cervical MRI and lumbar MRI scans performed 04/26/13. Lumbar spine showed L2-3 and L3-4 mild foraminal stenosis, L4-5 mild right lateral recess, mild to moderate right and mild left foraminal stenosis. L5-S1 moderate right lateral recess, moderate right foraminal and mild left foraminal stenosis. Cervical spine MRI showed C2-3 mild right foraminal stenosis, C3-4 and mild central canal and moderate to severe right and mild to moderate left foraminal stenosis. C5-6 left paracentral, left lateral recess and left foraminal disc extrusion with moderate flattening of the ventral thecal sac and mild flattening of the ventral cord. Moderate central canal, moderate to severe left lateral recess and bilateral foraminal stenosis. Symptoms are unchanged from previous visit. Patient states he has also been having some right lower quadrant abdominal pain/cramping for about six months. He has not addressed this with his primary care physician. Due to see Dr. Windle Guard tomorrow.    Allergies TETRACYCLINE. 08/19/2006 AMOXICILLIN. 12/30/2004 WELLBUTRIN. 04/08/2004 VERAPAMIL. 05/16/2002   Social History Alcohol use. former drinker Children. 2 Current work status. disabled Drug/Alcohol Rehab (Currently). no Drug/Alcohol Rehab (Previously). no Exercise. Exercises monthly; does running / walking Exercises daily; does  running / walking Illicit drug use. yes Living situation. live with spouse Marital status. married Number of flights of stairs before winded. 1 2-3 Pain Contract. yes Tobacco use. current every day smoker; smoke(d) 1 1/2 pack(s) per day current every day smoker; smoke(d) 1 pack(s) per day Tobacco / smoke exposure. yes outdoors only   Medication History OxyCONTIN (40MG  Tablet ER 12HR, Oral) Active. Hydrocodone-Acetaminophen (10-325MG  Tablet, Oral) Active. Robaxin-750 (750MG  Tablet, Oral) Active. Lisinopril (20MG  Tablet, Oral) Active. Toprol XL (50MG  Tablet ER 24HR, Oral) Active. Maxzide-25 (37.5-25MG  Tablet, Oral) Active. Nitroglycerin (0.4MG /HR Patch 24HR, Transdermal) Active. Xalatan (0.005% Solution, Ophthalmic) Active. Optivar (0.05% Solution, Ophthalmic) Active. NexIUM (40MG  Capsule DR, Oral) Active. Cymbalta (60MG  Capsule DR Part, Oral) Active. Lunesta (3MG  Tablet, Oral) Active. Zocor (40MG  Tablet, Oral) Active. K-Dur ( Tablet ER, Oral) Active. Lasix (40MG  Tablet, Oral) Active. MetFORMIN HCl (1000MG  Tablet, Oral) Active. Neurontin (600MG  Tablet, Oral) Active. Medications Reconciled.   Objective Transcription  Very pleasant white male alert and oriented times three and in no acute distress. The patient is ambulating with a cane. Cervical spine good range of motion though with discomfort. No increase in respiratory effort. Lungs clear to auscultation bilaterally. Heart regular rate and rhythm. He has a grade 3/6 systolic murmur. Abdomen round and nondistended. He is mildly to moderately tender in the right lower quadrant with deep palpation. No palpable mass. Skin warm and dry.    Assessment & Plan Back pain (724.5)  Cervicalgia (723.1)   Assessments Transcription  C5-6 HNP/stenosis with neck pain and upper extremity radicular symptoms.    Plans Transcription  We will schedule the patient for C5-6 ACDF. Surgical procedure along with  potential recovery time and risks and complications discussed in detail. All questions answered. We will need preop medical and cardiac clearances  before final scheduling. I did speak with primary care physician, Dr. Windle Guard, today about the right lower quadrant pain that Mr. Rase has been having. He will look into this for [place] as he has an appointment scheduled tomorrow.    ADDENDUM  We have gone over his new MRI. It continues to show abnormality at the C5-6 level. He has left foraminal disc extrusion causing left C6 nerve compression. There is no cord signal change. I have also gone over his lumbar MRI also dated 04/26/13 which demonstrates mild to moderate chronic T12 wedge compression deformity, mild degenerative changes in the lower lumbar spine, no significant or severe spinal stenosis. There is moderate right lateral recess and right foraminal stenosis at L5-S1. At this point in time I think proceeding with just the one level C5-6 ACDF is warranted. There is no significant changes to suggest altering the surgical plan. The risks and benefits including infection, bleeding, nerve damage, death, stroke, paralysis, ongoing or worse pain, need for further surgery, loss of bowel and bladder control, nonunion, throat pain, swallowing difficulty, hoarseness in the voice were reviewed. The patient expressed and understanding of the risks and would like to proceed.

## 2013-05-22 NOTE — Anesthesia Procedure Notes (Signed)
Procedure Name: Intubation Date/Time: 05/22/2013 8:35 AM Performed by: Elon Alas Pre-anesthesia Checklist: Patient identified, Timeout performed, Emergency Drugs available, Suction available and Patient being monitored Patient Re-evaluated:Patient Re-evaluated prior to inductionOxygen Delivery Method: Circle system utilized Preoxygenation: Pre-oxygenation with 100% oxygen Intubation Type: IV induction Ventilation: Mask ventilation without difficulty Grade View: Grade I Tube type: Oral Tube size: 7.5 mm Number of attempts: 1 Airway Equipment and Method: Stylet and Video-laryngoscopy Placement Confirmation: positive ETCO2,  ETT inserted through vocal cords under direct vision and breath sounds checked- equal and bilateral Secured at: 23 cm Tube secured with: Tape Dental Injury: Teeth and Oropharynx as per pre-operative assessment

## 2013-05-22 NOTE — Transfer of Care (Signed)
Immediate Anesthesia Transfer of Care Note  Patient: [name]  Procedure(s) Performed: Procedure(s): ANTERIOR CERVICAL DECOMPRESSION/DISCECTOMY FUSION 1 LEVEL C5-6 ([name])  Patient Location: PACU  Anesthesia Type:General  Level of Consciousness: awake and alert   Airway & Oxygen Therapy: Patient Spontanous Breathing and Patient connected to nasal cannula oxygen  Post-op Assessment: Report given to PACU RN, Post -op Vital signs reviewed and stable and Patient moving all extremities X 4  Post vital signs: Reviewed and stable  Complications: No apparent anesthesia complications

## 2013-05-22 NOTE — Anesthesia Postprocedure Evaluation (Deleted)
I was consulted to provide airway evaluation and management. History reviewed and patient examined. General anesthesia was induced to facilitate the intubation. See cart for the medications. Endotracheal tube was placed under direct vision. See chart for size of endotracheal tube. ETCO2 and breath sounds were present. Vital signs per the record. CXR, ventilator settings, ABG's and sedation is per the orders of the patient's attending medical staff.  Janetta Hora. Gelene Mink, MD was personally present for this intubation.

## 2013-05-22 NOTE — Anesthesia Preprocedure Evaluation (Addendum)
Anesthesia Evaluation  Patient identified by MRN, date of birth, ID band Patient awake    Reviewed: Allergy & Precautions, H&P , NPO status , Patient's Chart, lab work & pertinent test results, reviewed documented beta blocker date and time   History of Anesthesia Complications Negative for: history of anesthetic complications  Airway Mallampati: II TM Distance: >3 FB Neck ROM: full    Dental  (+) Dental Advisory Given   Pulmonary pneumonia -, resolved, Current Smoker,  breath sounds clear to auscultation  Pulmonary exam normal       Cardiovascular hypertension, On Medications and On Home Beta Blockers + angina (last angina 5 months ago) + CAD, + Past MI and + Cardiac Stents (DES Cx) + Valvular Problems/Murmurs Rhythm:regular Rate:Normal  9/14 ECHO: EF 60-65%, valves OK   Neuro/Psych  Headaches, Depression negative psych ROS   GI/Hepatic Neg liver ROS, hiatal hernia, GERD-  Medicated and Controlled,  Endo/Other  diabetes (glu 120), Well Controlled, Type 2, Oral Hypoglycemic Agents  Renal/GU negative Renal ROS  negative genitourinary   Musculoskeletal   Abdominal   Peds  Hematology negative hematology ROS (+)   Anesthesia Other Findings See surgeon's H&P   Reproductive/Obstetrics negative OB ROS                         Anesthesia Physical Anesthesia Plan  ASA: III  Anesthesia Plan: General   Post-op Pain Management:    Induction: Intravenous  Airway Management Planned: Oral ETT  Additional Equipment:   Intra-op Plan:   Post-operative Plan: Extubation in OR  Informed Consent: I have reviewed the patients History and Physical, chart, labs and discussed the procedure including the risks, benefits and alternatives for the proposed anesthesia with the patient or authorized representative who has indicated his/her understanding and acceptance.   Dental Advisory Given  Plan Discussed  with: CRNA and Surgeon  Anesthesia Plan Comments:         Anesthesia Quick Evaluation

## 2013-05-22 NOTE — Anesthesia Postprocedure Evaluation (Signed)
Anesthesia Post Note  Patient: [name]  Procedure(s) Performed: Procedure(s) (LRB): ANTERIOR CERVICAL DECOMPRESSION/DISCECTOMY FUSION 1 LEVEL C5-6 ([name])  Anesthesia type: General  Patient location: PACU  Post pain: Pain level controlled  Post assessment: Patient's Cardiovascular Status Stable  Last Vitals:  Filed Vitals:   05/22/13 1121  BP: 151/76  Pulse: 70  Temp:   Resp:     Post vital signs: Reviewed and stable  Level of consciousness: alert  Complications: No apparent anesthesia complications

## 2013-05-22 NOTE — Plan of Care (Signed)
Problem: Consults Goal: Diagnosis - Spinal Surgery Outcome: Completed/Met Date Met:  05/22/13 Cervical Spine Fusion     

## 2013-05-22 NOTE — Preoperative (Signed)
Beta Blockers   Reason not to administer Beta Blockers:Not Applicable, pt took 05/22/13

## 2013-05-23 NOTE — Op Note (Signed)
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  OPERATIVE PROCEDURE:  Anterior cervical diskectomy and fusion C5-6.  FIRST ASSISTANT:  Genene Churn. Denton Meek.  INSTRUMENTATION USED:  A size 8 medium Titan titanium intervertebral cage packed with DBX mix with a size 14 anterior cervical diffuse guide aligned plate affixed with 14 mm screws.  COMPLICATIONS:  None.  HISTORY:  This is a very pleasant 67 year old gentleman who has been complaining of severe neck and arm pain for sometime.  The patient got temporary relief with a C6 selective nerve root block.  Because of the progressive debilitation and correlation between these clinical findings, MRI and diagnostic studies, we elected to proceed with the aforementioned procedure.  All appropriate risks, benefits, and alternatives were discussed with the patient and consent was obtained.  OPERATIVE NOTE:  The patient was brought into the operating room, placed supine on the operating table.  After successful induction of general anesthesia and endotracheal intubation, TEDs, SCDs were applied.  Rolled towels placed in a shoulder blades and the anterior cervical spine was prepped and draped in standard fashion.  Time-out was taken to confirm patient, procedure, and all other pertinent important data.  Knee and neck were then prepped and draped for an anterior approach.  X- ray confirmed the incision site and this was infiltrated with 0.25% Marcaine.  A midline transverse incision was made starting at the midline and proceeding  to the left.  Sharp dissection was carried out down to the platysma.  Platysma was sharply incised and I continued a standard Clementeen Graham approach to the anterior cervical spine.  I sharply dissected down the deep cervical fascia sweeping the trachea and esophagus, and omohyoid to the right and identified and protecting the carotid sheath with my finger on the left.  I then palpated the anterior longitudinal ligament.  I placed a retractor and then used Kittner dissector to remove the remaining prevertebral fascia.  I placed a needle into the C5- 6 disk space and took an intraoperative x-ray to confirm that I was at the appropriate level.  Once this was done, I mobilized the longus coli muscles from the midbody of C6-C5 to the midbody of C6.  I then placed a retractor blade underneath the longus coli muscles, deflated endotracheal cuff and expanded the retractor blades to the appropriate width and reinflated the endotracheal cuff.  At this point, I incised.  A 15 blade scalp was used to perform an annulotomy and then used a combination of pituitary rongeurs, curettes, and Kerrison rongeurs, I removed the bulk of the disk material.  I used a 2 and 3 mm Kerrison to remove the overhanging osteophyte from the inferior portion of the C5 vertebral body.  I then placed distraction pins into the bodies of C5 and  C6 and distracted through the disk space and then maintained it with the distraction pins.  This allowed me to continue my diskectomy posteriorly.  There were free fragments of disk that I could remove with a nerve hook mostly central into the left.  I then used an 1-mm Kerrison to remove the osteophytes from the posterior aspect of the C5 and C6 vertebral bodies.  I used a fine nerve hook to develop a plane underneath the posterior longitudinal ligament.  I then used my 1-mm Kerrison to remove the posterior longitudinal ligament.  At this point, I had an excellent decompression.  I  gave my 1-mm Kerrison under the uncovertebral joint, and debride the osteophyte.  At this point, I had excellent bilateral foraminotomies underneath the uncovertebral joint and a central decompression.  I then rasped the endplates and made sure I had bleeding subchondral bone.  I then trialed several different sizes and elected to use the 8 medium lordotic cage.  This was packed with DBX mix and malleted to the appropriate depth.  I then removed the distraction pins and placed in the plate and affixed it with self- drilling screws.  All screws were torqued down appropriately and were tightened down.  I had excellent purchase and finding locking device was done according manufacturers.  I irrigated the wound copiously with normal saline, removed the retractors and checked to make sure I had hemostasis.  Once this was confirmed with bipolar electrocautery under direct observation, I returned the trachea and esophagus to midline. After ensuring it did not become entrapped beneath the plate.  Final x- rays were taken which were satisfactory.  The platysma was closed with interrupted 2-0 Vicryl sutures, and 3-0 Monocryl for the skin.  Steri- Strips and dry dressing were applied.  The patient was extubated, transferred to PACU, then in Aspen collar.     Alvy Beal, MD     DDB/MEDQ  D:  05/22/2013  T:  05/23/2013  Job:  [id number]

## 2013-05-23 NOTE — Progress Notes (Signed)
Patient notified RN that he was not staying overnight in the hospital. He wants to go home. MD notified and patient discharged. Patient was voiding adequate amount of urine, ambulating with cane. Incision area with no s/s of redness, swelling and/or infection. Patient wants to go home and be on his pain medication regimen. Anterior cervical fusion education notes given to patient and family at discharged. Discharged instructions explained to patient and family. Patient and family stated understanding of instructions given. Aisha I

## 2013-05-24 ENCOUNTER — Encounter (HOSPITAL_COMMUNITY): Payer: Self-pay | Admitting: Orthopedic Surgery

## 2013-06-04 ENCOUNTER — Telehealth: Payer: Self-pay | Admitting: *Deleted

## 2013-06-04 DIAGNOSIS — E7800 Pure hypercholesterolemia, unspecified: Secondary | ICD-10-CM

## 2013-06-04 MED ORDER — ATORVASTATIN CALCIUM 80 MG PO TABS: 80.0000 mg | ORAL_TABLET | Freq: Every day | ORAL | Status: DC

## 2013-06-04 NOTE — Telephone Encounter (Signed)
Spoke with pt, dr Tenny Craw had gotten his cholesterol results from dr Jeannetta Nap from 06/2012. She would like the pt to switch to lipitor 80 mg. Pt agreed with this change. He will finish the simvastatin he has and then switch to the lipitor. Script sent to the pharm. The pt is aware he will need fasting lab work when he has been on the lipitor for 12 weeks. Orders placed and pt will call and make an appt for labs once he makes the switch.

## 2013-06-05 NOTE — Discharge Summary (Signed)
Patient ID: SANTOS SOLLENBERGER MRN: [id number] DOB/AGE: August 31, 1945 67 y.o.  Admit date: 05/22/2013 Discharge date: 06/05/2013  Admission Diagnoses:  Active Problems:   * No active hospital problems. *   Discharge Diagnoses:  Active Problems:   * No active hospital problems. *  status post Procedure(s): ANTERIOR CERVICAL DECOMPRESSION/DISCECTOMY FUSION 1 LEVEL C5-6  Past Medical History  Diagnosis Date  . DIABETES MELLITUS, TYPE II 05/02/2007  . HYPERCHOLESTEROLEMIA 10/27/2009  . DEPRESSION 10/27/2009  . HYPERTENSION 05/02/2007  . CORONARY ARTERY DISEASE 05/02/2007  . ALLERGIC RHINITIS 05/02/2007  . ESOPHAGEAL STRICTURE 12/10/2007  . GERD 12/10/2007  . OSTEOARTHRITIS, LUMBAR SPINE 10/27/2009  . DUODENITIS 12/10/2007  . TRANSAMINASES, SERUM, ELEVATED 12/10/2007  . Insomnia   . Anginal pain     Ntg as needed taken 7 months ago  . Heart murmur   . Myocardial infarction 2005  . Pneumonia   . H/O hiatal hernia   . Headache(784.0)     migraines years ago  . Gall stones     Surgeries: Procedure(s): ANTERIOR CERVICAL DECOMPRESSION/DISCECTOMY FUSION 1 LEVEL C5-6 on 05/22/2013   Consultants:  none  Discharged Condition: Improved  Hospital Course: IDAN PRIME is an 67 y.o. male who was admitted 05/22/2013 for operative treatment of <principal problem not specified>. Patient failed conservative treatments (please see the history and physical for the specifics) and had severe unremitting pain that affects sleep, daily activities and work/hobbies. After pre-op clearance, the patient was taken to the operating room on 05/22/2013 and underwent  Procedure(s): ANTERIOR CERVICAL DECOMPRESSION/DISCECTOMY FUSION 1 LEVEL C5-6.    Patient was given perioperative antibiotics:  Anti-infectives   Start     Dose/Rate Route Frequency Ordered Stop   05/22/13 1600  ceFAZolin (ANCEF) IVPB 1 g/50 mL premix  Status:  Discontinued     1 g 100 mL/hr over 30 Minutes Intravenous Every 8 hours 05/22/13  1247 05/22/13 2210   05/21/13 1429  ceFAZolin (ANCEF) IVPB 2 g/50 mL premix     2 g 100 mL/hr over 30 Minutes Intravenous 30 min pre-op 05/21/13 1429 05/22/13 0851       Patient was given sequential compression devices and early ambulation to prevent DVT.   Patient benefited maximally from hospital stay and there were no complications. At the time of discharge, the patient was urinating/moving their bowels without difficulty, tolerating a regular diet, pain is controlled with oral pain medications and they have been cleared by PT/OT.   Recent vital signs: No data found.    Recent laboratory studies: No results found for this basename: WBC, HGB, HCT, PLT, NA, K, CL, CO2, BUN, CREATININE, GLUCOSE, PT, INR, CALCIUM, 2,  in the last 72 hours   Discharge Medications:     Medication List    STOP taking these medications       aspirin 81 MG tablet      TAKE these medications       azelastine 0.05 % ophthalmic solution  Commonly known as:  OPTIVAR  Place 1 drop into both eyes 2 (two) times daily.     docusate sodium 100 MG capsule  Commonly known as:  COLACE  Take 1 capsule (100 mg total) by mouth 2 (two) times daily.     DULoxetine 60 MG capsule  Commonly known as:  CYMBALTA  Take 60 mg by mouth 2 (two) times daily. Take 60 mg at 6 am, and 60 mg at 2 pm.     esomeprazole 40 MG capsule  Commonly known as:  NEXIUM  Take 40 mg by mouth 2 (two) times daily.     eszopiclone 3 MG Tabs  Generic drug:  Eszopiclone  Take 3 mg by mouth at bedtime. Take immediately before bedtime     furosemide 40 MG tablet  Commonly known as:  LASIX  Take 20 mg by mouth every 12 (twelve) hours. Take one and one half tabs every day     gabapentin 600 MG tablet  Commonly known as:  NEURONTIN  Take 600 mg by mouth 3 (three) times daily.     KLOR-CON M20 20 MEQ tablet  Generic drug:  potassium chloride SA  Take 20 mEq by mouth daily. One and one half tabs every day     latanoprost 0.005 %  ophthalmic solution  Commonly known as:  XALATAN  Place 1 drop into both eyes at bedtime.     lisinopril 20 MG tablet  Commonly known as:  PRINIVIL,ZESTRIL  Take 20 mg by mouth daily.     metFORMIN 1000 MG tablet  Commonly known as:  GLUCOPHAGE  Take 500 mg by mouth 2 (two) times daily with a meal. TAKE 1/2 TABLET IN THE MORNING AND 1/2 TABLET IN THE EVENING     methocarbamol 500 MG tablet  Commonly known as:  ROBAXIN  Take 1 tablet (500 mg total) by mouth every 6 (six) hours as needed (spasms).     metoprolol succinate 50 MG 24 hr tablet  Commonly known as:  TOPROL-XL  Take 25 mg by mouth daily. Take 1/2 tablet by mouth once daily     nitroGLYCERIN 0.4 MG SL tablet  Commonly known as:  NITROSTAT  Place 0.4 mg under the tongue every 5 (five) minutes as needed.     ondansetron 4 MG disintegrating tablet  Commonly known as:  ZOFRAN ODT  Take 1 tablet (4 mg total) by mouth every 8 (eight) hours as needed for nausea.     oxyCODONE-acetaminophen 10-325 MG per tablet  Commonly known as:  PERCOCET  Take 1 tablet by mouth every 4 (four) hours as needed. Dr Vear Clock     OXYCONTIN 60 MG Tb12  Generic drug:  Oxycodone HCl  Take 1 tablet by mouth every 8 (eight) hours.     polyethylene glycol powder powder  Commonly known as:  GLYCOLAX  Take 17 g by mouth daily.     triamterene-hydrochlorothiazide 37.5-25 MG per tablet  Commonly known as:  MAXZIDE-25  Take 1 tablet by mouth daily.        Diagnostic Studies: Dg Chest 2 View  05/22/2013   CLINICAL DATA:  Pre operative respiratory EXAM. Cervical disc disease.  EXAM: CHEST  2 VIEW  COMPARISON:  05/27/2010  FINDINGS: The heart size and pulmonary vascularity are normal. There is chronic accentuation of the interstitial markings. Coronary stents are noted. No effusions. No acute osseous abnormality. Accentuation of the thoracolumbar kyphosis.  IMPRESSION: No acute abnormality. Chronic interstitial lung disease.   Electronically Signed    By: Geanie Cooley   On: 05/22/2013 07:10   Dg Cervical Spine 2-3 Views  05/22/2013   *RADIOLOGY REPORT*  Clinical Data: ACDF C5 - C6  DG C-ARM 1-60 MIN,CERVICAL SPINE - 2-3 VIEW  Comparison:  Cervical spine radiographs - 05/22/2013.  Fluoroscopy time:  23 seconds.  Findings:  Two spot intraoperative fluoroscopic images of the cervical spine are provided for review.  Images demonstrate the sequela of C5 - C6 ACDF and intervertebral disc space replacement.  There  has been restoration of the C5 - C6 intervertebral disc space height.  Alignment appears near anatomic.  An endotracheal tube overlies the tracheal air column with tip excluded from view.  There is a minimal amount of expected postoperative prevertebral soft tissue swelling.  No radiopaque foreign body.  IMPRESSION: Post C5 - C6 ACDF without definite evidence of complication.   Original Report Authenticated By: Tacey Ruiz, MD   Dg Cervical Spine 2 Or 3 Views  05/22/2013   CLINICAL DATA:  Status post ACDF  EXAM: CERVICAL SPINE - 2-3 VIEW  COMPARISON:  Intraoperative fluoroscopic spot images dated 05/22/2013 at 1021 hrs  FINDINGS: Cervical spine is visualized to the bottom of C7 on the lateral view.  C5-6 ACDF in satisfactory position.  Mild multilevel degenerative changes.  IMPRESSION: C5-6 ACDF in satisfactory position.   Electronically Signed   By: Charline Bills M.D.   On: 05/22/2013 12:29   Dg Cervical Spine 2 Or 3 Views  05/22/2013   CLINICAL DATA:  Cervical disc disease.  EXAM: CERVICAL SPINE - 2-3 VIEW  COMPARISON:  Cervical MRI dated 12/05/2012  FINDINGS: There is slight narrowing of the C6-7 disc space. Slight reversal of the lower cervical lordosis. There is also slight narrowing of the disc spaces C2-3. Calcifications seen in both carotid bifurcations.  IMPRESSION: Slight degenerative disc disease at C2-3 and C6-7.   Electronically Signed   By: Geanie Cooley   On: 05/22/2013 07:12   Ct Abdomen Pelvis W Contrast  05/10/2013   CLINICAL  DATA:  Right lower quadrant cramping x7 months  EXAM: CT ABDOMEN AND PELVIS WITH CONTRAST  TECHNIQUE: Multidetector CT imaging of the abdomen and pelvis was performed using the standard protocol following bolus administration of intravenous contrast.  CONTRAST:  OMNIPAQUE IOHEXOL 300 MG/ML  SOLN  Note: The patient had a small volume contrast extravasation (estimated to be less than 15 mL) at the IV site (right antecubital fossa). On examination the area remained soft, without pain/tenderness, and without neurologic symptoms in the hand. The patient was given an ice pack and told to elevate his arm when possible over the next 24 hours. He was instructed to present to the ED if symptoms worsened.  COMPARISON:  02/08/2007  FINDINGS: Mild subpleural opacity/scarring in the anterior right middle lobe (series 3/ image 10).  Coronary atherosclerosis with coronary stents.  Liver, spleen, pancreas, and adrenal glands are within normal limits.  Gallbladder is notable for small layering gallstones (series 2/image 43). No associated inflammatory changes. No intrahepatic or extrahepatic ductal dilatation.  Kidneys are within normal limits. No hydronephrosis.  No evidence of bowel obstruction. Normal appendix.  Atherosclerotic calcifications of the abdominal aorta and branch vessels.  No abdominopelvic ascites.  No suspicious abdominopelvic lymphadenopathy.  Prostate is unremarkable.  Bladder is within normal limits.  Degenerative changes of the visualized thoracolumbar spine. Mild superior endplate changes at T12.  IMPRESSION: No evidence of bowel obstruction. Normal appendix.  Cholelithiasis, without associated inflammatory changes.  No CT findings to account for the patient's chronic right lower quadrant abdominal pain.  Small volume IV contrast extravasation, as described above.   Electronically Signed   By: Charline Bills M.D.   On: 05/10/2013 15:41   Dg C-arm 1-60 Min  05/22/2013   *RADIOLOGY REPORT*  Clinical  Data: ACDF C5 - C6  DG C-ARM 1-60 MIN,CERVICAL SPINE - 2-3 VIEW  Comparison:  Cervical spine radiographs - 05/22/2013.  Fluoroscopy time:  23 seconds.  Findings:  Two spot  intraoperative fluoroscopic images of the cervical spine are provided for review.  Images demonstrate the sequela of C5 - C6 ACDF and intervertebral disc space replacement.  There has been restoration of the C5 - C6 intervertebral disc space height.  Alignment appears near anatomic.  An endotracheal tube overlies the tracheal air column with tip excluded from view.  There is a minimal amount of expected postoperative prevertebral soft tissue swelling.  No radiopaque foreign body.  IMPRESSION: Post C5 - C6 ACDF without definite evidence of complication.   Original Report Authenticated By: Tacey Ruiz, MD        Discharge Orders   Future Appointments Provider Department Dept Phone   07/30/2013 11:00 AM Romero Belling, MD [facility] [phone]   Future Orders Complete By Expires   Call MD / Call 911  As directed    Comments:     If you experience chest pain or shortness of breath, CALL 911 and be transported to the hospital emergency room.  If you develope a fever above 101 F, pus (white drainage) or increased drainage or redness at the wound, or calf pain, call your surgeon's office.   Constipation Prevention  As directed    Comments:     Drink plenty of fluids.  Prune juice may be helpful.  You may use a stool softener, such as Colace (over the counter) 100 mg twice a day.  Use MiraLax (over the counter) for constipation as needed.   Diet - low sodium heart healthy  As directed    Discharge instructions  As directed    Comments:     Ok to shower 5 days postop.  Do not apply any creams or ointments to incision.  Do not remove steri-strips.  Can use 4x4 gauze and tape for dressing changes.  No aggressive activity. Cervical collar must be on at all times except when showering.  Do not bend or turn neck.    Driving restrictions  As directed    Comments:     No driving until further notice.   Increase activity slowly as tolerated  As directed    Lifting restrictions  As directed    Comments:     No lifting until further notice.      Follow-up Information   Schedule an appointment as soon as possible for a visit with Alvy Beal, MD. (need return office visit 2 weeks postop)    Specialty:  Orthopedic Surgery   Contact information:   [address] [place] [address] [phone]       Discharge Plan:  discharge to home  Disposition: stable    Signed: Venita Lick D for Dr. Venita Lick [facility] [phone] 06/05/2013, 8:24 AM

## 2013-06-25 ENCOUNTER — Other Ambulatory Visit: Payer: Self-pay | Admitting: Internal Medicine

## 2013-06-27 ENCOUNTER — Other Ambulatory Visit: Payer: Self-pay

## 2013-07-01 ENCOUNTER — Other Ambulatory Visit: Payer: Self-pay | Admitting: Family Medicine

## 2013-07-01 ENCOUNTER — Ambulatory Visit
Admission: RE | Admit: 2013-07-01 | Discharge: 2013-07-01 | Disposition: A | Discharge status: Home / Self Care | Payer: Medicare Other | Source: Ambulatory Visit | Attending: Family Medicine | Admitting: Family Medicine

## 2013-07-01 DIAGNOSIS — R0600 Dyspnea, unspecified: Secondary | ICD-10-CM

## 2013-07-30 ENCOUNTER — Ambulatory Visit: Payer: Medicare Other | Admitting: Endocrinology

## 2013-08-26 ENCOUNTER — Ambulatory Visit: Payer: Medicare Other | Admitting: Endocrinology

## 2013-09-12 ENCOUNTER — Ambulatory Visit: Payer: Medicare Other | Admitting: Endocrinology

## 2013-09-30 ENCOUNTER — Ambulatory Visit: Payer: Medicare Other | Admitting: Endocrinology

## 2013-10-15 ENCOUNTER — Ambulatory Visit: Payer: Medicare Other | Admitting: Endocrinology

## 2014-01-14 ENCOUNTER — Other Ambulatory Visit: Payer: Self-pay

## 2014-01-14 ENCOUNTER — Telehealth: Payer: Self-pay | Admitting: Endocrinology

## 2014-01-14 ENCOUNTER — Other Ambulatory Visit: Payer: Self-pay | Admitting: Internal Medicine

## 2014-01-14 MED ORDER — ONETOUCH LANCETS MISC: Status: DC

## 2014-01-14 MED ORDER — GLUCOSE BLOOD VI STRP: ORAL_STRIP | Status: DC

## 2014-01-14 MED ORDER — ONETOUCH ULTRA 2 W/DEVICE KIT: PACK | Status: DC

## 2014-01-14 NOTE — Telephone Encounter (Signed)
Please call back regarding the test strip change

## 2014-01-15 NOTE — Telephone Encounter (Signed)
Left voice mail informing that test trips have been changed to one touch ultra 2 due to insurance no longer covering previous strips. Pt advised that a 30 day supply was sent and a appointment must be made for further refills.

## 2014-01-20 ENCOUNTER — Ambulatory Visit: Payer: Medicare Other | Admitting: Endocrinology

## 2014-02-18 ENCOUNTER — Other Ambulatory Visit: Payer: Self-pay | Admitting: Endocrinology

## 2014-03-22 ENCOUNTER — Other Ambulatory Visit: Payer: Self-pay | Admitting: Internal Medicine

## 2014-09-08 ENCOUNTER — Telehealth: Payer: Self-pay | Admitting: Internal Medicine

## 2014-09-08 NOTE — Telephone Encounter (Signed)
PT HAVING PROBLEM WITH BP, SEEING PCP AND WAS PUT ON LISINOPRIL BUT STILL HIGH, HASN'T SEEN ROSS IN A WHILE, (LOST HIS WIFE IN SEPTEMBER AND LOST TOUCH WITH [place] AFTER THAT) PLS ADVISE

## 2014-09-09 ENCOUNTER — Ambulatory Visit: Payer: Medicare Other | Admitting: Physician Assistant

## 2014-09-09 ENCOUNTER — Emergency Department (HOSPITAL_COMMUNITY): Payer: Medicare Other

## 2014-09-09 ENCOUNTER — Encounter (HOSPITAL_COMMUNITY): Payer: Self-pay | Admitting: *Deleted

## 2014-09-09 ENCOUNTER — Emergency Department (HOSPITAL_COMMUNITY)
Admission: EM | Admit: 2014-09-09 | Discharge: 2014-09-09 | Disposition: A | Discharge status: Home / Self Care | Payer: Medicare Other | Attending: Emergency Medicine | Admitting: Emergency Medicine

## 2014-09-09 DIAGNOSIS — I25119 Atherosclerotic heart disease of native coronary artery with unspecified angina pectoris: Secondary | ICD-10-CM | POA: Insufficient documentation

## 2014-09-09 DIAGNOSIS — Z792 Long term (current) use of antibiotics: Secondary | ICD-10-CM | POA: Insufficient documentation

## 2014-09-09 DIAGNOSIS — J329 Chronic sinusitis, unspecified: Secondary | ICD-10-CM

## 2014-09-09 DIAGNOSIS — Z8669 Personal history of other diseases of the nervous system and sense organs: Secondary | ICD-10-CM | POA: Insufficient documentation

## 2014-09-09 DIAGNOSIS — Z720 Tobacco use: Secondary | ICD-10-CM | POA: Insufficient documentation

## 2014-09-09 DIAGNOSIS — E119 Type 2 diabetes mellitus without complications: Secondary | ICD-10-CM | POA: Insufficient documentation

## 2014-09-09 DIAGNOSIS — R200 Anesthesia of skin: Secondary | ICD-10-CM

## 2014-09-09 DIAGNOSIS — Z8719 Personal history of other diseases of the digestive system: Secondary | ICD-10-CM | POA: Insufficient documentation

## 2014-09-09 DIAGNOSIS — I252 Old myocardial infarction: Secondary | ICD-10-CM | POA: Insufficient documentation

## 2014-09-09 DIAGNOSIS — R202 Paresthesia of skin: Secondary | ICD-10-CM | POA: Diagnosis not present

## 2014-09-09 DIAGNOSIS — Z8701 Personal history of pneumonia (recurrent): Secondary | ICD-10-CM | POA: Insufficient documentation

## 2014-09-09 DIAGNOSIS — R531 Weakness: Secondary | ICD-10-CM | POA: Diagnosis not present

## 2014-09-09 DIAGNOSIS — M47816 Spondylosis without myelopathy or radiculopathy, lumbar region: Secondary | ICD-10-CM | POA: Diagnosis not present

## 2014-09-09 DIAGNOSIS — I10 Essential (primary) hypertension: Secondary | ICD-10-CM | POA: Insufficient documentation

## 2014-09-09 DIAGNOSIS — Z79899 Other long term (current) drug therapy: Secondary | ICD-10-CM | POA: Insufficient documentation

## 2014-09-09 DIAGNOSIS — R011 Cardiac murmur, unspecified: Secondary | ICD-10-CM | POA: Diagnosis not present

## 2014-09-09 LAB — CBC WITH DIFFERENTIAL/PLATELET
Basophils Absolute: 0 10*3/uL (ref 0.0–0.1)
Basophils Relative: 0 % (ref 0–1)
EOS PCT: 1 % (ref 0–5)
Eosinophils Absolute: 0.1 10*3/uL (ref 0.0–0.7)
HCT: 37.9 % — ABNORMAL LOW (ref 39.0–52.0)
Hemoglobin: 13.1 g/dL (ref 13.0–17.0)
LYMPHS ABS: 2.2 10*3/uL (ref 0.7–4.0)
LYMPHS PCT: 31 % (ref 12–46)
MCH: 34.6 pg — ABNORMAL HIGH (ref 26.0–34.0)
MCHC: 34.6 g/dL (ref 30.0–36.0)
MCV: 100 fL (ref 78.0–100.0)
MONO ABS: 0.4 10*3/uL (ref 0.1–1.0)
Monocytes Relative: 6 % (ref 3–12)
NEUTROS ABS: 4.4 10*3/uL (ref 1.7–7.7)
NEUTROS PCT: 62 % (ref 43–77)
Platelets: 206 10*3/uL (ref 150–400)
RBC: 3.79 MIL/uL — ABNORMAL LOW (ref 4.22–5.81)
RDW: 13.3 % (ref 11.5–15.5)
WBC: 7.1 10*3/uL (ref 4.0–10.5)

## 2014-09-09 LAB — URINALYSIS, ROUTINE W REFLEX MICROSCOPIC
Bilirubin Urine: NEGATIVE
GLUCOSE, UA: NEGATIVE mg/dL
HGB URINE DIPSTICK: NEGATIVE
Ketones, ur: NEGATIVE mg/dL
LEUKOCYTES UA: NEGATIVE
Nitrite: NEGATIVE
PH: 6 (ref 5.0–8.0)
Protein, ur: NEGATIVE mg/dL
SPECIFIC GRAVITY, URINE: 1.016 (ref 1.005–1.030)
Urobilinogen, UA: 1 mg/dL (ref 0.0–1.0)

## 2014-09-09 LAB — RAPID URINE DRUG SCREEN, HOSP PERFORMED
AMPHETAMINES: NOT DETECTED
Barbiturates: NOT DETECTED
Benzodiazepines: NOT DETECTED
Cocaine: NOT DETECTED
Opiates: POSITIVE — AB
Tetrahydrocannabinol: NOT DETECTED

## 2014-09-09 LAB — COMPREHENSIVE METABOLIC PANEL
ALK PHOS: 55 U/L (ref 39–117)
ALT: 21 U/L (ref 0–53)
AST: 28 U/L (ref 0–37)
Albumin: 3.8 g/dL (ref 3.5–5.2)
Anion gap: 10 (ref 5–15)
BUN: 11 mg/dL (ref 6–23)
CALCIUM: 8.8 mg/dL (ref 8.4–10.5)
CHLORIDE: 102 meq/L (ref 96–112)
CO2: 27 mmol/L (ref 19–32)
Creatinine, Ser: 0.94 mg/dL (ref 0.50–1.35)
GFR calc Af Amer: >90 mL/min (ref >90)
GFR, EST NON AFRICAN AMERICAN: 85 mL/min — AB (ref >90)
GLUCOSE: 111 mg/dL — AB (ref 70–99)
Potassium: 3.3 mmol/L — ABNORMAL LOW (ref 3.5–5.1)
Sodium: 139 mmol/L (ref 135–145)
TOTAL PROTEIN: 6.6 g/dL (ref 6.0–8.3)
Total Bilirubin: 0.8 mg/dL (ref 0.3–1.2)

## 2014-09-09 LAB — TROPONIN I

## 2014-09-09 MED ORDER — OXYCODONE-ACETAMINOPHEN 5-325 MG PO TABS
2.0000 | ORAL_TABLET | Freq: Once | ORAL | Status: AC
  Administered 2014-09-09: 2 via ORAL
  Filled 2014-09-09: qty 2

## 2014-09-09 MED ORDER — LORAZEPAM 1 MG PO TABS
1.0000 mg | ORAL_TABLET | Freq: Once | ORAL | Status: DC
  Filled 2014-09-09: qty 1

## 2014-09-09 NOTE — ED Notes (Signed)
Numbness and tingling to lt.side extremities for x 3 days. Negative stroke screen. Denies pain. On antibiotics for sinus infection and productive cough.
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
## 2014-09-09 NOTE — ED Notes (Signed)
Pt back in room from MRI, request home pain meds for chronic back pain. Marissa, [place] notified.
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
## 2014-09-09 NOTE — ED Notes (Signed)
Pt requesting home pain meds for chronic back problems, PA Joe notified.

## 2014-09-09 NOTE — ED Notes (Signed)
Pt reports numbness and tingling to right side since Monday, reports that today numbness and tingling moved to entire left side. Pt denies any vision changes, weakness, or HA. Pt currently states that he has been on different medications due to HTN. Pt denies any n/v/d or fevers at this time.

## 2014-09-09 NOTE — Discharge Instructions (Signed)
Please call your doctor for a followup appointment within 24-48 hours. When you talk to your doctor please let them know that you were seen in the emergency department and have them acquire all of your records so that they can discuss the findings with you and formulate a treatment plan to fully care for your new and ongoing problems. Please follow-up with your primary care provider to be seen and reassessed Please have blood pressure rechecked with an approximately 24-48 hours Please rest and stay hydrated Please continue to take medications as prescribed and on a full stomach Please avoid any physical strenuous activity Please continue to monitor symptoms closely and if symptoms are to worsen or change (fever greater than 101, chills, sweating, nausea, vomiting, chest pain, shortness of breathe, difficulty breathing, weakness, numbness, tingling, worsening or changes to pain pattern, fall, head injury, visual changes, loss of vision to one eye, inability to walk, inability to control urine or bowel movements) please report back to the Emergency Department immediately.   Weakness Weakness is a lack of strength. It may be felt all over the body (generalized) or in one specific part of the body (focal). Some causes of weakness can be serious. You may need further medical evaluation, especially if you are elderly or you have a history of immunosuppression (such as chemotherapy or HIV), kidney disease, heart disease, or diabetes. CAUSES  Weakness can be caused by many different things, including:  Infection.  Physical exhaustion.  Internal bleeding or other blood loss that results in a lack of red blood cells (anemia).  Dehydration. This cause is more common in elderly people.  Side effects or electrolyte abnormalities from medicines, such as pain medicines or sedatives.  Emotional distress, anxiety, or depression.  Circulation problems, especially severe peripheral arterial disease.  Heart  disease, such as rapid atrial fibrillation, bradycardia, or heart failure.  Nervous system disorders, such as Guillain-Barr syndrome, multiple sclerosis, or stroke. DIAGNOSIS  To find the cause of your weakness, your caregiver will take your history and perform a physical exam. Lab tests or X-rays may also be ordered, if needed. TREATMENT  Treatment of weakness depends on the cause of your symptoms and can vary greatly. HOME CARE INSTRUCTIONS   Rest as needed.  Eat a well-balanced diet.  Try to get some exercise every day.  Only take over-the-counter or prescription medicines as directed by your caregiver. SEEK MEDICAL CARE IF:   Your weakness seems to be getting worse or spreads to other parts of your body.  You develop new aches or pains. SEEK IMMEDIATE MEDICAL CARE IF:   You cannot perform your normal daily activities, such as getting dressed and feeding yourself.  You cannot walk up and down stairs, or you feel exhausted when you do so.  You have shortness of breath or chest pain.  You have difficulty moving parts of your body.  You have weakness in only one area of the body or on only one side of the body.  You have a fever.  You have trouble speaking or swallowing.  You cannot control your bladder or bowel movements.  You have black or bloody vomit or stools. MAKE SURE YOU:  Understand these instructions.  Will watch your condition.  Will get help right away if you are not doing well or get worse. Document Released: 08/08/2005 Document Revised: 02/07/2012 Document Reviewed: 10/07/2011 [facility] Patient Information 2015 [place], [place]. This information is not intended to replace advice given to you by your health care provider.  Make sure you discuss any questions you have with your health care provider.

## 2014-09-09 NOTE — Telephone Encounter (Signed)
Spoke with patient.  BP has been elevated.  Taking lisinopril 40 mg.  Not taking Toprol "makes me feel sick". C/o feeling terrible with BP elevated. Only other c/o is a weird warm prickly feeling. He called EMS yesterday but didn't go to hospital because BP was back to normal when they came.    "I hate hospitals but right now I feel like I'm ready to go" Currently BP is 166/97.  Yesterday 200/110s. HR has been 60s-80s. Has history of CAD/stents.  Reports anxiety/depression since wife died last year.  Started lexapro several days ago. Also reports has chronic pain, takes oxycontin 60 mg tid and percocet 10/325 6x daily.  Has not missed any doses.  But feels a little like withdrawal symptoms.    Denies SOB or chest pain. Daughter is present with patient in background. Patient has not seen Dr. Harrington Challenger since 2014. Discussed with [name], PA  (flex DOD)  He can see today. Informed patient that if he feels like he needs to go to ER then he should go there to be evaluated.  He verbalizes understanding.  He will come today at 3:30 unless something changes with his condition.

## 2014-09-15 ENCOUNTER — Other Ambulatory Visit: Payer: Self-pay

## 2014-09-15 ENCOUNTER — Ambulatory Visit (INDEPENDENT_AMBULATORY_CARE_PROVIDER_SITE_OTHER): Payer: Medicare Other | Admitting: Physician Assistant

## 2014-09-15 ENCOUNTER — Encounter: Payer: Self-pay | Admitting: Physician Assistant

## 2014-09-15 VITALS — BP 150/80 | HR 92 | Ht 66.0 in | Wt 172.0 lb

## 2014-09-15 DIAGNOSIS — I25118 Atherosclerotic heart disease of native coronary artery with other forms of angina pectoris: Secondary | ICD-10-CM

## 2014-09-15 DIAGNOSIS — I209 Angina pectoris, unspecified: Secondary | ICD-10-CM

## 2014-09-15 DIAGNOSIS — R209 Unspecified disturbances of skin sensation: Secondary | ICD-10-CM

## 2014-09-15 DIAGNOSIS — R200 Anesthesia of skin: Secondary | ICD-10-CM

## 2014-09-15 DIAGNOSIS — F17200 Nicotine dependence, unspecified, uncomplicated: Secondary | ICD-10-CM

## 2014-09-15 DIAGNOSIS — R079 Chest pain, unspecified: Secondary | ICD-10-CM | POA: Insufficient documentation

## 2014-09-15 DIAGNOSIS — I10 Essential (primary) hypertension: Secondary | ICD-10-CM

## 2014-09-15 DIAGNOSIS — Z720 Tobacco use: Secondary | ICD-10-CM

## 2014-09-15 LAB — BASIC METABOLIC PANEL
BUN: 9 mg/dL (ref 6–23)
CHLORIDE: 101 meq/L (ref 96–112)
CO2: 30 mEq/L (ref 19–32)
CREATININE: 0.78 mg/dL (ref 0.40–1.50)
Calcium: 9.5 mg/dL (ref 8.4–10.5)
GFR: 105.68 mL/min (ref >60.00)
GLUCOSE: 133 mg/dL — AB (ref 70–99)
Potassium: 3.2 mEq/L — ABNORMAL LOW (ref 3.5–5.1)
Sodium: 137 mEq/L (ref 135–145)

## 2014-09-15 LAB — TSH: TSH: 1.12 u[IU]/mL (ref 0.35–4.50)

## 2014-09-15 MED ORDER — METOPROLOL SUCCINATE ER 25 MG PO TB24: 25.0000 mg | ORAL_TABLET | Freq: Every day | ORAL | Status: DC

## 2014-09-15 NOTE — Assessment & Plan Note (Signed)
Patient has had an increase of chest pain recently with 1-2 episodes of angina per month requiring nitroglycerin. He had drug-eluting stents to the circumflex and LAD in 2005 and has had no evaluation since then. He continues to smoke almost 2 packs of cigarettes a day and is also diabetic and blood pressure is uncontrolled. Recommend stress Myoview to rule out ischemia. Resume Toprol-XL 50 mg one half tablet daily. Recommend smoking cessation.

## 2014-09-15 NOTE — Assessment & Plan Note (Signed)
Patient has had recent increase in his angina. No evaluation since his cath in 2005. Recommend stress Myoview. We'll also check 2-D echo for LV function and systolic murmur.

## 2014-09-15 NOTE — Patient Instructions (Addendum)
Your physician recommends that you continue on your current medications as directed. Please refer to the Current Medication list given to you today.  Your physician has requested that you have a lexiscan myoview. For further information please visit [employer]uge[employer]iest[employer]tn. Please follow instruction sheet, as given.  Your physician has requested that you have an echocardiogram. Echocardiography is a painless test that uses sound waves to create images of your heart. It provides your doctor with information about the size and shape of your heart and how well your heart's chambers and valves are working. This procedure takes approximately one hour. There are no restrictions for this procedure.  Your physician recommends that you return for lab work in: today (BMET and TSH)  Your physician recommends that you schedule a follow-up appointment in: 1 month with Dr Harrington Challenger     Your physician recommends that you schedule a follow-up appointment in: 1 month with Dr RossSmoking Cessation Quitting smoking is important to your health and has many advantages. However, it is not always easy to quit since nicotine is a very addictive drug. Oftentimes, people try 3 times or more before being able to quit. This document explains the best ways for you to prepare to quit smoking. Quitting takes hard work and a lot of effort, but you can do it. ADVANTAGES OF QUITTING SMOKING  You will live longer, feel better, and live better.  Your body will feel the impact of quitting smoking almost immediately.  Within 20 minutes, blood pressure decreases. Your pulse returns to its normal level.  After 8 hours, carbon monoxide levels in the blood return to normal. Your oxygen level increases.  After 24 hours, the chance of having a heart attack starts to decrease. Your breath, hair, and body stop smelling like smoke.  After 48 hours, damaged nerve endings begin to recover. Your sense of taste and smell improve.  After 72 hours,  the body is virtually free of nicotine. Your bronchial tubes relax and breathing becomes easier.  After 2 to 12 weeks, lungs can hold more air. Exercise becomes easier and circulation improves.  The risk of having a heart attack, stroke, cancer, or lung disease is greatly reduced.  After 1 year, the risk of coronary heart disease is cut in half.  After 5 years, the risk of stroke falls to the same as a nonsmoker.  After 10 years, the risk of lung cancer is cut in half and the risk of other cancers decreases significantly.  After 15 years, the risk of coronary heart disease drops, usually to the level of a nonsmoker.  If you are pregnant, quitting smoking will improve your chances of having a healthy baby.  The people you live with, especially any children, will be healthier.  You will have extra money to spend on things other than cigarettes. QUESTIONS TO THINK ABOUT BEFORE ATTEMPTING TO QUIT You may want to talk about your answers with your health care provider.  Why do you want to quit?  If you tried to quit in the past, what helped and what did not?  What will be the most difficult situations for you after you quit? How will you plan to handle them?  Who can help you through the tough times? Your family? Friends? A health care provider?  What pleasures do you get from smoking? What ways can you still get pleasure if you quit? Here are some questions to ask your health care provider:  How can you help me to be successful at  quitting?  What medicine do you think would be best for me and how should I take it?  What should I do if I need more help?  What is smoking withdrawal like? How can I get information on withdrawal? GET READY  Set a quit date.  Change your environment by getting rid of all cigarettes, ashtrays, matches, and lighters in your home, car, or work. Do not let people smoke in your home.  Review your past attempts to quit. Think about what worked and what  did not. GET SUPPORT AND ENCOURAGEMENT You have a better chance of being successful if you have help. You can get support in many ways.  Tell your family, friends, and coworkers that you are going to quit and need their support. Ask them not to smoke around you.  Get individual, group, or telephone counseling and support. Programs are available at [employer] and health centers. Call your local health department for information about programs in your area.  Spiritual beliefs and practices may help some smokers quit.  Download a "quit meter" on your computer to keep track of quit statistics, such as how long you have gone without smoking, cigarettes not smoked, and money saved.  Get a self-help book about quitting smoking and staying off tobacco. [place] yourself from urges to smoke. Talk to someone, go for a walk, or occupy your time with a task.  Change your normal routine. Take a different route to work. Drink tea instead of coffee. Eat breakfast in a different place.  Reduce your stress. Take a hot bath, exercise, or read a book.  Plan something enjoyable to do every day. Reward yourself for not smoking.  Explore interactive web-based programs that specialize in helping you quit. GET MEDICINE AND USE IT CORRECTLY Medicines can help you stop smoking and decrease the urge to smoke. Combining medicine with the above behavioral methods and support can greatly increase your chances of successfully quitting smoking.  Nicotine replacement therapy helps deliver nicotine to your body without the negative effects and risks of smoking. Nicotine replacement therapy includes nicotine gum, lozenges, inhalers, nasal sprays, and skin patches. Some may be available over-the-counter and others require a prescription.  Antidepressant medicine helps people abstain from smoking, but how this works is unknown. This medicine is available by prescription.  Nicotinic  receptor partial agonist medicine simulates the effect of nicotine in your brain. This medicine is available by prescription. Ask your health care provider for advice about which medicines to use and how to use them based on your health history. Your health care provider will tell you what side effects to look out for if you choose to be on a medicine or therapy. Carefully read the information on the package. Do not use any other product containing nicotine while using a nicotine replacement product.  RELAPSE OR DIFFICULT SITUATIONS Most relapses occur within the first 3 months after quitting. Do not be discouraged if you start smoking again. Remember, most people try several times before finally quitting. You may have symptoms of withdrawal because your body is used to nicotine. You may crave cigarettes, be irritable, feel very hungry, cough often, get headaches, or have difficulty concentrating. The withdrawal symptoms are only temporary. They are strongest when you first quit, but they will go away within 10-14 days. To reduce the chances of relapse, try to:  Avoid drinking alcohol. Drinking lowers your chances of successfully quitting.  Reduce the amount of caffeine you  consume. Once you quit smoking, the amount of caffeine in your body increases and can give you symptoms, such as a rapid heartbeat, sweating, and anxiety.  Avoid smokers because they can make you want to smoke.  Do not let weight gain distract you. Many smokers will gain weight when they quit, usually less than 10 pounds. Eat a healthy diet and stay active. You can always lose the weight gained after you quit.  Find ways to improve your mood other than smoking. FOR MORE INFORMATION  www.smokefree.gov  Document Released: 08/02/2001 Document Revised: 12/23/2013 Document Reviewed: 11/17/2011 [facility] Patient Information 2015 [place], [place]. This information is not intended to replace advice given to you by your health care  provider. Make sure you discuss any questions you have with your health care provider.

## 2014-09-15 NOTE — Assessment & Plan Note (Signed)
Blood pressures been up and down but worse since his Toprol was stopped. He felt like it was causing the flu once it was increased. We'll resume Toprol-XL 25 mg once daily. Hopefully that with the increase in lisinopril will control his blood pressure. He is also having severe anxiety since his wife died which is contributing to his hypertension. Follow-up with Dr. Arelia Sneddon concerning his anxiety.

## 2014-09-15 NOTE — Assessment & Plan Note (Signed)
Patient develops numbness all over his body when his blood pressure goes up. We'll check TSH repeat bmet since potassium was low last week. Take extra potassium today and tomorrow. Check 2-D echo.
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
## 2014-09-15 NOTE — Assessment & Plan Note (Signed)
I had along discussion with the patient concerning smoking cessation. He truly needs to stop especially with his diabetes. 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

## 2014-09-16 ENCOUNTER — Other Ambulatory Visit: Payer: Self-pay | Admitting: Family Medicine

## 2014-09-16 DIAGNOSIS — R079 Chest pain, unspecified: Secondary | ICD-10-CM

## 2014-09-17 ENCOUNTER — Telehealth: Payer: Self-pay | Admitting: Nurse Practitioner

## 2014-09-17 DIAGNOSIS — E876 Hypokalemia: Secondary | ICD-10-CM

## 2014-09-17 NOTE — Telephone Encounter (Signed)
Spoke with patient and advised of lab results.  Patient reports he takes 20 meq KDur daily, states he was advised by [name], PA-C at office visit yesterday to double up for a few days.  Patient reports he has taken 40 meq yesterday and today and will return to taking 20 meq tomorrow (Thursday).  I scheduled patient for follow-up lab appointment for 2/3.  Patient verbalized understanding and agreement with plan of care. BMET order in epic

## 2014-09-17 NOTE — Telephone Encounter (Signed)
-----   Message from Imogene Burn, PA-C sent at 09/17/2014 10:46 AM EST ----- Patient's potassium still low. It's not listed on his medications, but I know he's on it. Can you verify his dose? Take 20 meq 2 tablets twice today then 1 tablet twice daily. Repeat bmet next week.

## 2014-09-19 ENCOUNTER — Ambulatory Visit (HOSPITAL_BASED_OUTPATIENT_CLINIC_OR_DEPARTMENT_OTHER): Payer: Medicare Other | Admitting: Cardiology

## 2014-09-19 ENCOUNTER — Ambulatory Visit (HOSPITAL_COMMUNITY): Payer: Medicare Other | Attending: Internal Medicine | Admitting: Radiology

## 2014-09-19 DIAGNOSIS — E119 Type 2 diabetes mellitus without complications: Secondary | ICD-10-CM | POA: Insufficient documentation

## 2014-09-19 DIAGNOSIS — I10 Essential (primary) hypertension: Secondary | ICD-10-CM | POA: Insufficient documentation

## 2014-09-19 DIAGNOSIS — R079 Chest pain, unspecified: Secondary | ICD-10-CM

## 2014-09-19 DIAGNOSIS — R0609 Other forms of dyspnea: Secondary | ICD-10-CM | POA: Insufficient documentation

## 2014-09-19 DIAGNOSIS — R002 Palpitations: Secondary | ICD-10-CM | POA: Diagnosis not present

## 2014-09-19 DIAGNOSIS — I25118 Atherosclerotic heart disease of native coronary artery with other forms of angina pectoris: Secondary | ICD-10-CM

## 2014-09-19 DIAGNOSIS — R011 Cardiac murmur, unspecified: Secondary | ICD-10-CM

## 2014-09-19 DIAGNOSIS — I2510 Atherosclerotic heart disease of native coronary artery without angina pectoris: Secondary | ICD-10-CM | POA: Diagnosis not present

## 2014-09-19 MED ORDER — REGADENOSON 0.4 MG/5ML IV SOLN
0.4000 mg | Freq: Once | INTRAVENOUS | Status: AC
  Administered 2014-09-19: 0.4 mg via INTRAVENOUS

## 2014-09-19 MED ORDER — TECHNETIUM TC 99M SESTAMIBI GENERIC - CARDIOLITE
11.0000 | Freq: Once | INTRAVENOUS | Status: AC | PRN
  Administered 2014-09-19: 11 via INTRAVENOUS

## 2014-09-19 MED ORDER — TECHNETIUM TC 99M SESTAMIBI GENERIC - CARDIOLITE
33.0000 | Freq: Once | INTRAVENOUS | Status: AC | PRN
  Administered 2014-09-19: 33 via INTRAVENOUS

## 2014-09-19 NOTE — Progress Notes (Signed)
Echo performed. 

## 2014-09-19 NOTE — Progress Notes (Signed)
[name] [address] [place], [place] [address] [phone]    Cardiology Nuclear Med Study  [name] is a 69 y.o. male     MRN : [id number]     DOB: 05-Oct-1945  Procedure Date: 09/19/2014  Nuclear Med Background Indication for Stress Test:  Evaluation for Ischemia and follow up CAD History:  CAD Cardiac Risk Factors: Hypertension and NIDDM  Symptoms:  Chest Pain (last date of chest discomfort was 09/13/14), DOE and Palpitations   Nuclear Pre-Procedure Caffeine/Decaff Intake:  None NPO After: 11:00pm   Lungs:  clear O2 Sat: 93% on room air. IV 0.9% NS with Angio Cath:  20g  IV Site: L Antecubital  IV Started by:  Ileene Hutchinson, EMT-P  Chest Size (in):  48 Cup Size: n/a  Height: 5\' 6"  (1.676 m)  Weight:  176 lb (79.833 kg)  BMI:  Body mass index is 28.42 kg/(m^2). Tech Comments:  n/a    Nuclear Med Study 1 or 2 day study: 1 day  Stress Test Type:  Lexiscan  Reading MD: Liane Comber, MD  Order Authorizing Provider:  Lizbeth Bark, MD; Gerrianne Scale, PA-C  Resting Radionuclide: Technetium 94m Sestamibi  Resting Radionuclide Dose: 11.0 mCi   Stress Radionuclide:  Technetium 61m Sestamibi  Stress Radionuclide Dose: 33.0 mCi           Stress Protocol Rest HR: 64 Stress HR: 70  Rest BP: 132/67 Stress BP: 116/89  Exercise Time (min): n/a METS: n/a   Predicted Max HR: 154 bpm % Max HR: 45.45 bpm Rate Pressure Product: 11200   Dose of Adenosine (mg):  n/a Dose of Lexiscan: 0.4 mg  Dose of Atropine (mg): n/a Dose of Dobutamine: n/a mcg/kg/min (at max HR)  Stress Test Technologist: Glade Lloyd, BS-ES  Nuclear Technologist:  Earl Many, CNMT     Rest Procedure:  Myocardial perfusion imaging was performed at rest 45 minutes following the intravenous administration of Technetium 71m Sestamibi. Rest ECG: NSR-RBBB  Stress Procedure:  The patient received IV Lexiscan 0.4 mg over 15-seconds.  Technetium 11m Sestamibi injected at 30-seconds.   Quantitative spect images were obtained after a 45 minute delay.  During the infusion of Lexiscan the patient complained of SOB, feeling weird and right sided neck thumping.  These symptoms resolved in recovery.  Stress ECG: No significant change from baseline ECG  QPS Raw Data Images:  Normal; no motion artifact; normal heart/lung ratio. Stress Images:  Normal homogeneous uptake in all areas of the myocardium. Rest Images:  Normal homogeneous uptake in all areas of the myocardium. Subtraction (SDS):  No evidence of ischemia. Transient Ischemic Dilatation (Normal <1.22):  1.09 Lung/Heart Ratio (Normal <0.45):  0.35  Quantitative Gated Spect Images QGS EDV:  109 ml QGS ESV:  48 ml  Impression Exercise Capacity:  Lexiscan with no exercise. BP Response:  Normal blood pressure response. Clinical Symptoms:  There is dyspnea. ECG Impression:  No significant ST segment change suggestive of ischemia. Comparison with Prior Nuclear Study: No images to compare  Overall Impression:  Normal stress nuclear study.  LV Ejection Fraction: 56%.  LV Wall Motion:  NL LV Function; NL Wall Motion    Branko Steeves H 09/19/2014   .

## 2014-09-21 ENCOUNTER — Other Ambulatory Visit: Payer: Self-pay | Admitting: Internal Medicine

## 2014-09-22 ENCOUNTER — Ambulatory Visit
Admission: RE | Admit: 2014-09-22 | Discharge: 2014-09-22 | Disposition: A | Discharge status: Home / Self Care | Payer: Medicare Other | Source: Ambulatory Visit | Attending: Family Medicine | Admitting: Family Medicine

## 2014-09-22 DIAGNOSIS — R079 Chest pain, unspecified: Secondary | ICD-10-CM

## 2014-09-24 ENCOUNTER — Other Ambulatory Visit (INDEPENDENT_AMBULATORY_CARE_PROVIDER_SITE_OTHER): Payer: Medicare Other | Admitting: *Deleted

## 2014-09-24 DIAGNOSIS — E876 Hypokalemia: Secondary | ICD-10-CM

## 2014-09-24 LAB — BASIC METABOLIC PANEL
BUN: 7 mg/dL (ref 6–23)
CO2: 28 mEq/L (ref 19–32)
Calcium: 9.4 mg/dL (ref 8.4–10.5)
Chloride: 106 mEq/L (ref 96–112)
Creatinine, Ser: 0.82 mg/dL (ref 0.40–1.50)
GFR: 99.75 mL/min (ref >60.00)
GLUCOSE: 117 mg/dL — AB (ref 70–99)
POTASSIUM: 4 meq/L (ref 3.5–5.1)
Sodium: 139 mEq/L (ref 135–145)

## 2014-09-26 ENCOUNTER — Telehealth: Payer: Self-pay

## 2014-09-26 DIAGNOSIS — E7800 Pure hypercholesterolemia, unspecified: Secondary | ICD-10-CM

## 2014-09-29 NOTE — Telephone Encounter (Signed)
Patient states he has not taken ANY cholesterol lowering medication in "6-7 months".  He went to his pharmacy to request filling atorvastatin, this medication appears to have taken off his med list and dc'd at last OV 09/15/14.  Lipids have not been checked recently. Last recorded in epic is 2014.  Pt is aware that I am forwarding to Dr. Harrington Challenger for recommendations. He has appointment with Dr. Harrington Challenger 10/13/14. Advised him to come for lipid panel this week. After that Dr. Harrington Challenger will review and determine which medication/how much he needs to be on.  Pt verbalizes understanding and appreciation for the help.  States his wife died in June 05, 2023 and since then he has been having trouble keeping things straight.

## 2014-10-06 NOTE — Telephone Encounter (Signed)
Prescribe zocor 40 mg  Check lipids in 8 wks with AST

## 2014-10-07 ENCOUNTER — Other Ambulatory Visit: Payer: Medicare Other

## 2014-10-10 ENCOUNTER — Telehealth: Payer: Self-pay | Admitting: *Deleted

## 2014-10-10 DIAGNOSIS — E785 Hyperlipidemia, unspecified: Secondary | ICD-10-CM

## 2014-10-10 MED ORDER — SIMVASTATIN 40 MG PO TABS: 40.0000 mg | ORAL_TABLET | Freq: Every day | ORAL | Status: DC

## 2014-10-10 NOTE — Telephone Encounter (Signed)
Spoke with patient. He will pick up Zocor, labs for April. Sees Dr. Harrington Challenger 2/22.

## 2014-10-10 NOTE — Telephone Encounter (Signed)
Left message for patient to call back. New order placed for zocor 40 mg and lipids and AST for in 8 weeks.

## 2014-10-10 NOTE — Telephone Encounter (Signed)
Fay Records, MD at 10/06/2014 5:33 PM     Status: Signed       Expand All Collapse All   Prescribe zocor 40 mg Check lipids in 8 wks with AST            Rodman Key, RN at 09/29/2014 11:13 AM     Status: Signed       Expand All Collapse All   Patient states he has not taken ANY cholesterol lowering medication in "6-7 months".  He went to his pharmacy to request filling atorvastatin, this medication appears to have taken off his med list and dc'd at last OV 09/15/14.  Lipids have not been checked recently. Last recorded in epic is 2014.  Pt is aware that I am forwarding to Dr. Harrington Challenger for recommendations. He has appointment with Dr. Harrington Challenger 10/13/14. Advised him to come for lipid panel this week. After that Dr. Harrington Challenger will review and determine which medication/how much he needs to be on.  Pt verbalizes understanding and appreciation for the help. States his wife died in May 27, 2023 and since then he has been having trouble keeping things straight.
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
## 2014-10-13 ENCOUNTER — Ambulatory Visit (INDEPENDENT_AMBULATORY_CARE_PROVIDER_SITE_OTHER): Payer: Medicare Other | Admitting: Internal Medicine

## 2014-10-13 ENCOUNTER — Encounter: Payer: Self-pay | Admitting: Internal Medicine

## 2014-10-13 VITALS — BP 162/88 | HR 78 | Ht 66.0 in | Wt 181.6 lb

## 2014-10-13 DIAGNOSIS — I10 Essential (primary) hypertension: Secondary | ICD-10-CM

## 2014-10-13 MED ORDER — AMLODIPINE BESYLATE 2.5 MG PO TABS: 2.5000 mg | ORAL_TABLET | Freq: Every day | ORAL | Status: DC

## 2014-10-13 NOTE — Patient Instructions (Signed)
Your physician has recommended you make the following change in your medication:  1.) start amlodipine (Norvasc) 2.5 mg once daily  Please call in 3-4 weeks with blood pressure readings.  Your physician recommends that you schedule a follow-up appointment in 3 months with Dr. Harrington Challenger.

## 2014-12-10 ENCOUNTER — Other Ambulatory Visit (INDEPENDENT_AMBULATORY_CARE_PROVIDER_SITE_OTHER): Payer: Medicare Other | Admitting: *Deleted

## 2014-12-10 DIAGNOSIS — E785 Hyperlipidemia, unspecified: Secondary | ICD-10-CM

## 2014-12-10 LAB — LIPID PANEL
Cholesterol: 126 mg/dL (ref 0–200)
HDL: 51 mg/dL (ref >39.00)
LDL CALC: 57 mg/dL (ref 0–99)
NonHDL: 75
Total CHOL/HDL Ratio: 2
Triglycerides: 89 mg/dL (ref 0.0–149.0)
VLDL: 17.8 mg/dL (ref 0.0–40.0)

## 2014-12-10 LAB — AST: AST: 23 U/L (ref 0–37)

## 2014-12-26 ENCOUNTER — Encounter: Payer: Self-pay | Admitting: Internal Medicine

## 2014-12-26 ENCOUNTER — Ambulatory Visit (INDEPENDENT_AMBULATORY_CARE_PROVIDER_SITE_OTHER): Payer: Medicare Other | Admitting: Internal Medicine

## 2014-12-26 VITALS — BP 122/62 | HR 71 | Ht 66.0 in | Wt 176.4 lb

## 2014-12-26 DIAGNOSIS — E785 Hyperlipidemia, unspecified: Secondary | ICD-10-CM

## 2014-12-26 DIAGNOSIS — I10 Essential (primary) hypertension: Secondary | ICD-10-CM | POA: Diagnosis not present

## 2014-12-26 NOTE — Patient Instructions (Signed)
Medication Instructions:  Your physician recommends that you continue on your current medications as directed. Please refer to the Current Medication list given to you today.   Labwork: none  Testing/Procedures: none  Follow-Up: Your physician wants you to follow-up in: December/January You will receive a reminder letter in the mail two months in advance. If you don't receive a letter, please call our office to schedule the follow-up appointment.   Any Other Special Instructions Will Be Listed Below (If Applicable).
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
## 2015-02-13 ENCOUNTER — Telehealth: Payer: Self-pay | Admitting: Internal Medicine

## 2015-02-13 NOTE — Telephone Encounter (Signed)
Pt called as he is having tingling with some numbness in his right shoulder down his arm and right leg starting in his thigh. He has no c/o of CP or SOB.  Pt states that his chest "feels empty".  Pt still smokes and does not notice a difference in the feeling when he smokes.  Pt states this has been happening for the past a 2-3 days with it being relived by rest but most times in the afternoon nothing seems to make it better. Pt stated he was not doing anything when it started the first time.  He is worried as he felt something similar to this when he had his MI years ago but with that he had pain then and nothing like that this time. Review pt concerns with Dr. Harrington Challenger, pt to call his PCP.  Pt agrees to call PCP and not additional questions at this time.

## 2015-02-13 NOTE — Telephone Encounter (Signed)
New message      Pt states he has numbness and tingling in rt shoulder and arm and tingling in leg.  However he is having a "strange" feeling in his chest

## 2015-03-11 ENCOUNTER — Other Ambulatory Visit: Payer: Self-pay | Admitting: Physician Assistant

## 2015-04-14 ENCOUNTER — Encounter: Payer: Self-pay | Admitting: Gastroenterology

## 2015-05-04 ENCOUNTER — Other Ambulatory Visit: Payer: Self-pay | Admitting: Internal Medicine

## 2015-06-05 ENCOUNTER — Ambulatory Visit: Payer: Medicare Other | Admitting: Endocrinology

## 2015-06-10 ENCOUNTER — Ambulatory Visit: Payer: Medicare Other | Admitting: Endocrinology

## 2015-06-19 ENCOUNTER — Ambulatory Visit (INDEPENDENT_AMBULATORY_CARE_PROVIDER_SITE_OTHER): Payer: Medicare Other | Admitting: Endocrinology

## 2015-06-19 ENCOUNTER — Encounter: Payer: Self-pay | Admitting: Endocrinology

## 2015-06-19 VITALS — BP 144/80 | HR 84 | Temp 98.4°F | Ht 66.0 in | Wt 175.0 lb

## 2015-06-19 DIAGNOSIS — E1159 Type 2 diabetes mellitus with other circulatory complications: Secondary | ICD-10-CM

## 2015-06-19 LAB — POCT GLYCOSYLATED HEMOGLOBIN (HGB A1C): Hemoglobin A1C: 6.2

## 2015-06-19 NOTE — Patient Instructions (Addendum)
check your blood sugar once a day.  vary the time of day when you check, between before the 3 meals, and at bedtime.  also check if you have symptoms of your blood sugar being too high or too low.  please keep a record of the readings and bring it to your next appointment here (or you can bring the meter itself).  You can write it on any piece of paper.  please call us sooner if your blood sugar goes below 70, or if you have a lot of readings over 200.  Please continue the same metformin. Please come back for a follow-up appointment in 6 months.   
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
## 2015-07-06 ENCOUNTER — Other Ambulatory Visit: Payer: Self-pay | Admitting: Internal Medicine

## 2015-07-07 ENCOUNTER — Other Ambulatory Visit: Payer: Self-pay | Admitting: Internal Medicine

## 2015-07-24 ENCOUNTER — Other Ambulatory Visit: Payer: Self-pay | Admitting: Otolaryngology

## 2015-07-24 DIAGNOSIS — J329 Chronic sinusitis, unspecified: Secondary | ICD-10-CM

## 2015-07-24 DIAGNOSIS — R52 Pain, unspecified: Secondary | ICD-10-CM

## 2015-07-29 ENCOUNTER — Ambulatory Visit
Admission: RE | Admit: 2015-07-29 | Discharge: 2015-07-29 | Disposition: A | Discharge status: Home / Self Care | Payer: Medicare Other | Source: Ambulatory Visit | Attending: Otolaryngology | Admitting: Otolaryngology

## 2015-07-29 DIAGNOSIS — R52 Pain, unspecified: Secondary | ICD-10-CM

## 2015-07-29 DIAGNOSIS — J329 Chronic sinusitis, unspecified: Secondary | ICD-10-CM

## 2015-09-07 ENCOUNTER — Other Ambulatory Visit: Payer: Self-pay | Admitting: Internal Medicine

## 2015-10-02 ENCOUNTER — Other Ambulatory Visit: Payer: Self-pay | Admitting: Internal Medicine

## 2015-12-18 ENCOUNTER — Ambulatory Visit: Payer: Medicare Other | Admitting: Endocrinology

## 2015-12-30 ENCOUNTER — Other Ambulatory Visit: Payer: Self-pay | Admitting: Internal Medicine

## 2016-01-03 ENCOUNTER — Other Ambulatory Visit: Payer: Self-pay | Admitting: Internal Medicine

## 2016-01-07 ENCOUNTER — Other Ambulatory Visit: Payer: Self-pay | Admitting: Internal Medicine

## 2016-02-04 ENCOUNTER — Other Ambulatory Visit: Payer: Self-pay | Admitting: Internal Medicine

## 2016-02-09 ENCOUNTER — Other Ambulatory Visit: Payer: Self-pay | Admitting: *Deleted

## 2016-02-09 MED ORDER — LISINOPRIL 20 MG PO TABS: ORAL_TABLET | ORAL | Status: DC

## 2016-02-19 ENCOUNTER — Ambulatory Visit (INDEPENDENT_AMBULATORY_CARE_PROVIDER_SITE_OTHER): Payer: Medicare Other | Admitting: Cardiology

## 2016-02-19 ENCOUNTER — Encounter: Payer: Self-pay | Admitting: Cardiology

## 2016-02-19 VITALS — BP 138/74 | HR 75 | Ht 66.0 in | Wt 178.0 lb

## 2016-02-19 DIAGNOSIS — R011 Cardiac murmur, unspecified: Secondary | ICD-10-CM | POA: Diagnosis not present

## 2016-02-19 DIAGNOSIS — R079 Chest pain, unspecified: Secondary | ICD-10-CM | POA: Diagnosis not present

## 2016-02-19 MED ORDER — NITROGLYCERIN 0.4 MG SL SUBL: 0.4000 mg | SUBLINGUAL_TABLET | SUBLINGUAL | Status: DC | PRN

## 2016-02-19 MED ORDER — AMLODIPINE BESYLATE 2.5 MG PO TABS: ORAL_TABLET | ORAL | Status: DC

## 2016-02-19 NOTE — Patient Instructions (Signed)
Medication Instructions:  None  Labwork: None  Testing/Procedures: Your physician has requested that you have a lexiscan myoview. For further information please visit [email or web address]. Please follow instruction sheet, as given.  Your physician has requested that you have an echocardiogram. Echocardiography is a painless test that uses sound waves to create images of your heart. It provides your doctor with information about the size and shape of your heart and how well your heart's chambers and valves are working. This procedure takes approximately one hour. There are no restrictions for this procedure.   Follow-Up: Your physician recommends that you schedule a follow-up appointment in: 2 weeks with Dr. Harrington Challenger or a PA/NP.   Any Other Special Instructions Will Be Listed Below (If Applicable).     If you need a refill on your cardiac medications before your next appointment, please call your pharmacy.

## 2016-02-19 NOTE — Progress Notes (Signed)
02/19/2016 [name] Netter   1946/06/15  [id number]  Primary Physician [name], MD Primary Cardiologist: Dr. Harrington Challenger   Reason for Visit/CC: Chest Pain  HPI:  70 y.o. male with a history of coronary artery disease status post drug-eluting stent to the circumflex and LAD in 2005. He also has a history of hypertension, hyper cholesterolemia, diabetes mellitus, GERD, depression and tobacco abuse. Nuclear stress test in 08/2014 showed no ischemia. Normal perfusion. Echo also showed normal LVF with EF of 55-60%, mild MR and LVH.   He presents to clinic today with a complaint of chest pain. Feels similar to his prior angina but not as severe. Has occurred of and on for the last several weeks. Substernal chest pressure occuring at rest. No associated dyspnea. Not worsened by exertion. Always relieved with 1-2 SL NTG.   He is currently CP free. EKG shows NSR with RBBB. BP is well controlled. He notes full medication compliance. 3/6 SM, loudest at LUSB noted. No aortic valve disease noted on prior echo.    Current Outpatient Prescriptions  Medication Sig Dispense Refill  . amLODipine (NORVASC) 2.5 MG tablet TAKE ONE TABLET BY MOUTH DAILY*NEEDS OFFICE VISIT* 30 tablet 0  . aspirin 81 MG tablet Take 81 mg by mouth daily.    Marland Kitchen azelastine (OPTIVAR) 0.05 % ophthalmic solution Place 1 drop into both eyes 2 (two) times daily.    . dorzolamide-timolol (COSOPT) 22.3-6.8 MG/ML ophthalmic solution Place 1 drop into both eyes daily.  12  . furosemide (LASIX) 40 MG tablet Take 20 mg by mouth every 12 (twelve) hours. Take one and one half tabs every day    . gabapentin (NEURONTIN) 600 MG tablet Take 600 mg by mouth 3 (three) times daily.    . hydrOXYzine (ATARAX/VISTARIL) 25 MG tablet Take 25 mg by mouth as directed.    . latanoprost (XALATAN) 0.005 % ophthalmic solution Place 1 drop into both eyes at bedtime.     Marland Kitchen lisinopril (PRINIVIL,ZESTRIL) 20 MG tablet TAKE 1 TABLET (20 MG TOTAL) BY MOUTH  DAILY. 30 tablet 0  . metFORMIN (GLUCOPHAGE) 1000 MG tablet Take 0.5 tablets by mouth 2 (two) times daily.  4  . methocarbamol (ROBAXIN) 750 MG tablet Take 750 mg by mouth 3 (three) times daily.     . metoprolol succinate (TOPROL-XL) 50 MG 24 hr tablet Take 0.5 mg by mouth 2 (two) times daily. Take with or immediately following a meal.    . nitroGLYCERIN (NITROSTAT) 0.4 MG SL tablet Place 0.4 mg under the tongue every 5 (five) minutes as needed for chest pain (x 3 doses).     . ONE TOUCH ULTRA TEST test strip USE 1 TIME DAILY TO CHECK BLOOD SUGAR. 35 each 0  . Oxycodone HCl (OXYCONTIN) 60 MG TB12 Take 1 tablet by mouth every 8 (eight) hours.     Marland Kitchen oxyCODONE-acetaminophen (PERCOCET) 10-325 MG per tablet Take 1 tablet by mouth every 4 (four) hours as needed. Dr Hardin Negus 40 tablet 0  . potassium chloride SA (K-DUR,KLOR-CON) 20 MEQ tablet Take 20 mEq by mouth once.    . simvastatin (ZOCOR) 40 MG tablet Take 40 mg by mouth at bedtime.  1  . VENTOLIN HFA 108 (90 BASE) MCG/ACT inhaler Inhale 2 puffs into the lungs 2 (two) times daily as needed for shortness of breath.      No current facility-administered medications for this visit.    Allergies  Allergen Reactions  . Bupropion Hcl Other (See Comments)    "  messes with my head"  . Doxycycline Hyclate Diarrhea       . Tetracycline Diarrhea  . Verapamil Other (See Comments)    "messes with my head"    Social History   Social History  . Marital Status: Married    Spouse Name: [name]  . Number of Children: [name]  . Years of Education: [name]   Occupational History  . Disabled    Social History Main Topics  . Smoking status: Current Every Day Smoker -- 1.50 packs/day for 56 years    Types: Cigarettes  . Smokeless tobacco: Never Used  . Alcohol Use: No  . Drug Use: No  . Sexual Activity: Not on file   Other Topics Concern  . Not on file   Social History Narrative     Review of Systems: General: negative for chills, fever, night sweats  or weight changes.  Cardiovascular: negative for chest pain, dyspnea on exertion, edema, orthopnea, palpitations, paroxysmal nocturnal dyspnea or shortness of breath Dermatological: negative for rash Respiratory: negative for cough or wheezing Urologic: negative for hematuria Abdominal: negative for nausea, vomiting, diarrhea, bright red blood per rectum, melena, or hematemesis Neurologic: negative for visual changes, syncope, or dizziness All other systems reviewed and are otherwise negative except as noted above.    Blood pressure 138/74, pulse 75, height 5\' 6"  (1.676 m), weight 178 lb (80.74 kg).  General appearance: alert, cooperative and no distress Neck: no carotid bruit and no JVD Lungs: clear to auscultation bilaterally Heart: regular rate and rhythm and 3/6 SM, loudest at the LUSB Extremities: trace bilateral LEE Pulses: 2+ and symmetric Skin: warm and dry Neurologic: Grossly normal  EKG NSR. RBBB. No ischemia.   ASSESSMENT AND PLAN:   1. Chest Pain/CAD: symptoms are concerning for angina/ cardiac etiology. He is currently CP free and EKG shows NSR with RBBB. No active CP at the moment. VSS. He does have a h/o CAD s/p LAD stenting in 2005. Low risk NST in 08/2014. He also has a 3/6 SM, loudest at the LUSB on exam. Prior echo 08/2014 showed mild MR but no mention of Aortic valve disease. We will obtain a NST to r/o coronary ischemia. Will also obtain a 2D echo to r/o aortic stenosis. Will hold off on prescribing Imdur until we have ruled-out the possibility of severe AS.   PLAN  F/u in 2 weeks with Dr. Harrington Challenger or myself after NST and 2D echo.   Lyda Jester PA-C 02/19/2016 2:14 PM

## 2016-03-03 ENCOUNTER — Telehealth (HOSPITAL_COMMUNITY): Payer: Self-pay | Admitting: *Deleted

## 2016-03-03 NOTE — Telephone Encounter (Signed)
Left message on voicemail per DPR in reference to upcoming appointment scheduled on 03/09/16 at 1000 with detailed instructions given per Myocardial Perfusion Study Information Sheet for the test. LM to arrive 15 minutes early, and that it is imperative to arrive on time for appointment to keep from having the test rescheduled. If you need to cancel or reschedule your appointment, please call the office within 24 hours of your appointment. Failure to do so may result in a cancellation of your appointment, and a $50 no show fee. Phone number given for call back for any questions.

## 2016-03-07 ENCOUNTER — Other Ambulatory Visit: Payer: Self-pay | Admitting: Physician Assistant

## 2016-03-08 NOTE — Telephone Encounter (Signed)
Please clarify Metoprolol 50 mg taken 1/2 tablet twice daily or Metoprolol 25 mg taken once daily. Pt saw [name] and it was not in the notes that this was changed, but a change was in the prescription. A refill request has been sent in. Please advise

## 2016-03-09 ENCOUNTER — Ambulatory Visit (HOSPITAL_COMMUNITY): Payer: Medicare Other | Attending: Cardiovascular Disease

## 2016-03-09 ENCOUNTER — Other Ambulatory Visit: Payer: Self-pay | Admitting: *Deleted

## 2016-03-09 ENCOUNTER — Ambulatory Visit (HOSPITAL_BASED_OUTPATIENT_CLINIC_OR_DEPARTMENT_OTHER): Payer: Medicare Other

## 2016-03-09 ENCOUNTER — Telehealth: Payer: Self-pay | Admitting: *Deleted

## 2016-03-09 ENCOUNTER — Other Ambulatory Visit: Payer: Self-pay

## 2016-03-09 DIAGNOSIS — R011 Cardiac murmur, unspecified: Secondary | ICD-10-CM | POA: Insufficient documentation

## 2016-03-09 DIAGNOSIS — I252 Old myocardial infarction: Secondary | ICD-10-CM | POA: Diagnosis not present

## 2016-03-09 DIAGNOSIS — I2510 Atherosclerotic heart disease of native coronary artery without angina pectoris: Secondary | ICD-10-CM | POA: Insufficient documentation

## 2016-03-09 DIAGNOSIS — R9439 Abnormal result of other cardiovascular function study: Secondary | ICD-10-CM | POA: Diagnosis not present

## 2016-03-09 DIAGNOSIS — I4510 Unspecified right bundle-branch block: Secondary | ICD-10-CM | POA: Insufficient documentation

## 2016-03-09 DIAGNOSIS — Z8249 Family history of ischemic heart disease and other diseases of the circulatory system: Secondary | ICD-10-CM | POA: Insufficient documentation

## 2016-03-09 DIAGNOSIS — I10 Essential (primary) hypertension: Secondary | ICD-10-CM | POA: Diagnosis not present

## 2016-03-09 DIAGNOSIS — Z720 Tobacco use: Secondary | ICD-10-CM | POA: Insufficient documentation

## 2016-03-09 DIAGNOSIS — E785 Hyperlipidemia, unspecified: Secondary | ICD-10-CM | POA: Insufficient documentation

## 2016-03-09 DIAGNOSIS — R079 Chest pain, unspecified: Secondary | ICD-10-CM | POA: Diagnosis not present

## 2016-03-09 DIAGNOSIS — E119 Type 2 diabetes mellitus without complications: Secondary | ICD-10-CM | POA: Insufficient documentation

## 2016-03-09 LAB — MYOCARDIAL PERFUSION IMAGING
CHL CUP NUCLEAR SSS: 2
CHL CUP RESTING HR STRESS: 61 {beats}/min
CSEPPHR: 76 {beats}/min
LV dias vol: 115 mL (ref 62–150)
LV sys vol: 51 mL
NUC STRESS TID: 1.03
RATE: 0.27
SDS: 0
SRS: 2

## 2016-03-09 MED ORDER — TECHNETIUM TC 99M TETROFOSMIN IV KIT
31.7000 | PACK | Freq: Once | INTRAVENOUS | Status: AC | PRN
  Administered 2016-03-09: 31.7 via INTRAVENOUS
  Filled 2016-03-09: qty 32

## 2016-03-09 MED ORDER — METOPROLOL SUCCINATE ER 25 MG PO TB24: 25.0000 mg | ORAL_TABLET | Freq: Every day | ORAL | Status: DC

## 2016-03-09 MED ORDER — REGADENOSON 0.4 MG/5ML IV SOLN
0.4000 mg | Freq: Once | INTRAVENOUS | Status: AC
  Administered 2016-03-09: 0.4 mg via INTRAVENOUS

## 2016-03-09 MED ORDER — TECHNETIUM TC 99M TETROFOSMIN IV KIT
10.1000 | PACK | Freq: Once | INTRAVENOUS | Status: AC | PRN
  Administered 2016-03-09: 10.1 via INTRAVENOUS
  Filled 2016-03-09: qty 10

## 2016-03-09 NOTE — Telephone Encounter (Signed)
Pt was in the office having a nuclear study.  I asked him what he is taking. He is going to call when he gets home and looks at his bottle. According to our records, it should be metoprolol 50 mg take 1/2 tablet BID, but I would wait until patient calls back to confirm this.

## 2016-03-11 ENCOUNTER — Other Ambulatory Visit: Payer: Self-pay | Admitting: *Deleted

## 2016-03-11 MED ORDER — LISINOPRIL 20 MG PO TABS: ORAL_TABLET | ORAL | Status: DC

## 2016-03-14 ENCOUNTER — Ambulatory Visit (INDEPENDENT_AMBULATORY_CARE_PROVIDER_SITE_OTHER): Payer: Medicare Other | Admitting: Physician Assistant

## 2016-03-14 ENCOUNTER — Encounter: Payer: Self-pay | Admitting: Physician Assistant

## 2016-03-14 VITALS — BP 164/78 | HR 71 | Ht 66.0 in | Wt 175.8 lb

## 2016-03-14 DIAGNOSIS — I2510 Atherosclerotic heart disease of native coronary artery without angina pectoris: Secondary | ICD-10-CM | POA: Diagnosis not present

## 2016-03-14 DIAGNOSIS — E7800 Pure hypercholesterolemia, unspecified: Secondary | ICD-10-CM | POA: Diagnosis not present

## 2016-03-14 DIAGNOSIS — F17200 Nicotine dependence, unspecified, uncomplicated: Secondary | ICD-10-CM | POA: Diagnosis not present

## 2016-03-14 DIAGNOSIS — I10 Essential (primary) hypertension: Secondary | ICD-10-CM
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
## 2016-03-14 NOTE — Patient Instructions (Signed)
Medication Instructions:   Your physician recommends that you continue on your current medications as directed. Please refer to the Current Medication list given to you today.   If you need a refill on your cardiac medications before your next appointment, please call your pharmacy.  Labwork: NONE ORDER TODAY    Testing/Procedures:  NONE ORDER TODAY    Follow-Up: WITH DR ROSS IN 4 MONTHS    Any Other Special Instructions Will Be Listed Below (If Applicable).

## 2016-07-03 NOTE — Progress Notes (Deleted)
   Subjective:    Patient ID: [name], male    DOB: 1946/08/12, 70 y.o.   MRN: [id number]  HPI Pt returns for f/u of diabetes mellitus: DM type: 2. Dx'ed: [id number] Complications: polyneuropathy, leg ulcers, retinopathy, and CAD.   Therapy: metformin.   DKA: never Severe hypoglycemia: never Pancreatitis: never Other: he has never taken insulin.   Interval history: pt states he feels well in general, except for recent URI (sees Dr Arelia Sneddon for this).  He says cbg's are well-controlled   Review of Systems     Objective:   Physical Exam VITAL SIGNS:  See vs page GENERAL: no distress Pulses: dorsalis pedis intact bilat.   MSK: no deformity of the feet CV: 1+ bilat leg edema, but there are bilateral varicosities.   Skin:  no ulcer on the feet.  normal color and temp on the feet.  Severe hyperpigmentation of the legs.  Neuro: sensation is intact to touch on the feet, but decreased from normal. Ext: There is bilateral onychomycosis of the toenails.        Assessment & Plan:

## 2016-07-05 ENCOUNTER — Ambulatory Visit: Payer: Medicare Other | Admitting: Endocrinology

## 2016-07-06 ENCOUNTER — Encounter: Payer: Self-pay | Admitting: Gastroenterology

## 2016-07-08 ENCOUNTER — Encounter: Payer: Self-pay | Admitting: Gastroenterology

## 2016-07-08 ENCOUNTER — Ambulatory Visit (INDEPENDENT_AMBULATORY_CARE_PROVIDER_SITE_OTHER): Payer: Medicare Other | Admitting: Gastroenterology

## 2016-07-08 VITALS — BP 152/72 | HR 80 | Ht 66.0 in | Wt 177.4 lb

## 2016-07-08 DIAGNOSIS — R195 Other fecal abnormalities: Secondary | ICD-10-CM | POA: Diagnosis not present

## 2016-07-08 DIAGNOSIS — R1084 Generalized abdominal pain: Secondary | ICD-10-CM | POA: Diagnosis not present

## 2016-07-08 DIAGNOSIS — R194 Change in bowel habit: Secondary | ICD-10-CM | POA: Diagnosis not present

## 2016-07-08 MED ORDER — NA SULFATE-K SULFATE-MG SULF 17.5-3.13-1.6 GM/177ML PO SOLN: 1.0000 | Freq: Once | ORAL | 0 refills | Status: AC

## 2016-07-08 NOTE — Patient Instructions (Signed)
Your physician has requested that you go to the basement for lab work before leaving today.  You have been scheduled for a colonoscopy. Please follow written instructions given to you at your visit today.  Please pick up your prep supplies at the pharmacy within the next 1-3 days. If you use inhalers (even only as needed), please bring them with you on the day of your procedure. Your physician has requested that you go to www.startemmi.com and enter the access code given to you at your visit today. This web site gives a general overview about your procedure. However, you should still follow specific instructions given to you by our office regarding your preparation for the procedure.  You have been scheduled for a CT scan of the abdomen and pelvis at [facility] ([id number]---this is in the same building as [facility]).   You are scheduled on 07/19/16 at 9:00am. You should arrive 15 minutes prior to your appointment time for registration. Please follow the written instructions below on the day of your exam:  WARNING: IF YOU ARE ALLERGIC TO IODINE/X-RAY DYE, PLEASE NOTIFY RADIOLOGY IMMEDIATELY AT [phone]! YOU WILL BE GIVEN A 13 HOUR PREMEDICATION PREP.  1) Do not eat or drink anything after 5:00am (4 hours prior to your test) 2) You have been given 2 bottles of oral contrast to drink. The solution may taste               better if refrigerated, but do NOT add ice or any other liquid to this solution. Shake             well before drinking.    Drink 1 bottle of contrast @ 7:00am (2 hours prior to your exam)  Drink 1 bottle of contrast @ 8:00am (1 hour prior to your exam)  You may take any medications as prescribed with a small amount of water except for the following: Metformin, Glucophage, Glucovance, Avandamet, Riomet, Fortamet, Actoplus Met, Janumet, Glumetza or Metaglip. The above medications must be held the day of the exam AND 48 hours after the exam.  The  purpose of you drinking the oral contrast is to aid in the visualization of your intestinal tract. The contrast solution may cause some diarrhea. Before your exam is started, you will be given a small amount of fluid to drink. Depending on your individual set of symptoms, you may also receive an intravenous injection of x-ray contrast/dye. Plan on being at [facility] for 30 minutes or longer, depending on the type of exam you are having performed.  This test typically takes 30-45 minutes to complete.  If you have any questions regarding your exam or if you need to reschedule, you may call the CT department at [phone] between the hours of 8:00 am and 5:00 pm, Monday-Friday.  ________________________________________________________________________  Thank you for choosing me and [facility].  Pricilla Riffle. Dagoberto Ligas., MD., [facility] Regal
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
## 2016-07-11 ENCOUNTER — Ambulatory Visit: Payer: Medicare Other | Admitting: Internal Medicine

## 2016-07-13 ENCOUNTER — Encounter: Payer: Self-pay | Admitting: Gastroenterology

## 2016-07-13 ENCOUNTER — Ambulatory Visit (AMBULATORY_SURGERY_CENTER): Payer: Medicare Other | Admitting: Gastroenterology

## 2016-07-13 VITALS — BP 148/82 | HR 71 | Temp 96.8°F | Resp 16 | Ht 66.0 in | Wt 177.0 lb

## 2016-07-13 DIAGNOSIS — R195 Other fecal abnormalities: Secondary | ICD-10-CM

## 2016-07-13 DIAGNOSIS — D125 Benign neoplasm of sigmoid colon: Secondary | ICD-10-CM

## 2016-07-13 DIAGNOSIS — R194 Change in bowel habit: Secondary | ICD-10-CM

## 2016-07-13 LAB — GLUCOSE, CAPILLARY
GLUCOSE-CAPILLARY: 136 mg/dL — AB (ref 65–99)
Glucose-Capillary: 102 mg/dL — ABNORMAL HIGH (ref 65–99)

## 2016-07-13 MED ORDER — SODIUM CHLORIDE 0.9 % IV SOLN: 500.0000 mL | INTRAVENOUS | Status: DC

## 2016-07-13 NOTE — Progress Notes (Signed)
Called into room because pt is upset and swearing at staff.  He is upset because he was skipped because he was drinking when he arrived at the [facility].  Two staff members noted him taking a drink.  He states to me, "I'm on narcotics and I need to constantly moisten my mouth.  I didn't swallow."  I explained that I was sorry if there was miscommunication re: drinking but we just wanted to make sure he as safe for his procedure.  I verified with the CRNA that he is next and I told the pt that they would be to get him shortly.

## 2016-07-13 NOTE — Progress Notes (Signed)
Called to room to assist during endoscopic procedure.  Patient ID and intended procedure confirmed with present staff. Received instructions for my participation in the procedure from the performing physician.  

## 2016-07-13 NOTE — Progress Notes (Signed)
Report to PACU, RN, vss, BBS= Clear.  

## 2016-07-13 NOTE — Op Note (Signed)
[facility] Patient Name: [name] Procedure Date: 07/13/2016 8:33 AM MRN: [id number] Endoscopist: Ladene Artist , MD Age: 70 Referring MD:  Date of Birth: Nov 07, 1945 Gender: Male Account #: [id number] Procedure:                Colonoscopy Indications:              Positive Cologuard test, Change in bowel habits Medicines:                Monitored Anesthesia Care Procedure:                Pre-Anesthesia Assessment:                           - Prior to the procedure, a History and Physical                            was performed, and patient medications and                            allergies were reviewed. The patient's tolerance of                            previous anesthesia was also reviewed. The risks                            and benefits of the procedure and the sedation                            options and risks were discussed with the patient.                            All questions were answered, and informed consent                            was obtained. Prior Anticoagulants: The patient has                            taken no previous anticoagulant or antiplatelet                            agents. ASA Grade Assessment: III - A patient with                            severe systemic disease. After reviewing the risks                            and benefits, the patient was deemed in                            satisfactory condition to undergo the procedure.                           After obtaining informed consent, the colonoscope  was passed under direct vision. Throughout the                            procedure, the patient's blood pressure, pulse, and                            oxygen saturations were monitored continuously. The                            Model PCF-H190L [id number]) scope was introduced                            through the anus and advanced to the the cecum,                            identified  by appendiceal orifice and ileocecal                            valve. The ileocecal valve, appendiceal orifice,                            and rectum were photographed. The quality of the                            bowel preparation was good. The colonoscopy was                            performed without difficulty. The patient tolerated                            the procedure well. Scope In: 8:40:53 AM Scope Out: 9:02:46 AM Scope Withdrawal Time: 0 hours 19 minutes 13 seconds  Total Procedure Duration: 0 hours 21 minutes 53 seconds  Findings:                 The perianal and digital rectal examinations were                            normal.                           A 7 mm polyp was found in the sigmoid colon. The                            polyp was sessile. The polyp was removed with a                            cold snare. Resection and retrieval were complete.                           A 4 mm polyp was found in the sigmoid colon. The                            polyp was sessile. The polyp was removed  with a                            cold biopsy forceps. Resection and retrieval were                            complete.                           A few small-mouthed diverticula were found in the                            sigmoid colon. There was no evidence of                            diverticular bleeding.                           The exam was otherwise without abnormality on                            direct and retroflexion views. Complications:            No immediate complications. Estimated blood loss:                            None. Estimated Blood Loss:     Estimated blood loss: none. Impression:               - One 7 mm polyp in the sigmoid colon, removed with                            a cold snare. Resected and retrieved.                           - One 4 mm polyp in the sigmoid colon, removed with                            a cold biopsy forceps. Resected and  retrieved.                           - Mild diverticulosis in the sigmoid colon. There                            was no evidence of diverticular bleeding.                           - The examination was otherwise normal on direct                            and retroflexion views. Recommendation:           - Repeat colonoscopy in 5 years for surveillance if                            polyp(s) are precancerous.                           -  Patient has a contact number available for                            emergencies. The signs and symptoms of potential                            delayed complications were discussed with the                            patient. Return to normal activities tomorrow.                            Written discharge instructions were provided to the                            patient.                           - High fiber diet. Increase daily water intake.                           - Continue present medications.                           - Await pathology results. Ladene Artist, MD 07/13/2016 9:06:34 AM This report has been signed electronically.

## 2016-07-13 NOTE — Patient Instructions (Signed)
  Handouts given : Polyps, Diverticulosis, High Fiber.  YOU HAD AN ENDOSCOPIC PROCEDURE TODAY AT [facility]:   Refer to the procedure report that was given to you for any specific questions about what was found during the examination.  If the procedure report does not answer your questions, please call your gastroenterologist to clarify.  If you requested that your care partner not be given the details of your procedure findings, then the procedure report has been included in a sealed envelope for you to review at your convenience later.  YOU SHOULD EXPECT: Some feelings of bloating in the abdomen. Passage of more gas than usual.  Walking can help get rid of the air that was put into your GI tract during the procedure and reduce the bloating. If you had a lower endoscopy (such as a colonoscopy or flexible sigmoidoscopy) you may notice spotting of blood in your stool or on the toilet paper. If you underwent a bowel prep for your procedure, you may not have a normal bowel movement for a few days.  Please Note:  You might notice some irritation and congestion in your nose or some drainage.  This is from the oxygen used during your procedure.  There is no need for concern and it should clear up in a day or so.  SYMPTOMS TO REPORT IMMEDIATELY:   Following lower endoscopy (colonoscopy or flexible sigmoidoscopy):  Excessive amounts of blood in the stool  Significant tenderness or worsening of abdominal pains  Swelling of the abdomen that is new, acute  Fever of 100F or higher   For urgent or emergent issues, a gastroenterologist can be reached at any hour by calling [phone].   DIET:  We do recommend a small meal at first, but then you may proceed to your regular diet.  Drink plenty of fluids but you should avoid alcoholic beverages for 24 hours.  ACTIVITY:  You should plan to take it easy for the rest of today and you should NOT DRIVE or use heavy machinery until tomorrow  (because of the sedation medicines used during the test).    FOLLOW UP: Our staff will call the number listed on your records the next business day following your procedure to check on you and address any questions or concerns that you may have regarding the information given to you following your procedure. If we do not reach you, we will leave a message.  However, if you are feeling well and you are not experiencing any problems, there is no need to return our call.  We will assume that you have returned to your regular daily activities without incident.  If any biopsies were taken you will be contacted by phone or by letter within the next 1-3 weeks.  Please call us at [phone] if you have not heard about the biopsies in 3 weeks.    SIGNATURES/CONFIDENTIALITY: You and/or your care partner have signed paperwork which will be entered into your electronic medical record.  These signatures attest to the fact that that the information above on your After Visit Summary has been reviewed and is understood.  Full responsibility of the confidentiality of this discharge information lies with you and/or your care-partner.

## 2016-07-18 ENCOUNTER — Telehealth: Payer: Self-pay

## 2016-07-18 ENCOUNTER — Telehealth: Payer: Self-pay | Admitting: Gastroenterology

## 2016-07-18 NOTE — Telephone Encounter (Signed)
I called patient back and notified him that I was incorrect this am and that he does need labs and will need to come today.  He is going to try and work it out and will call me back if he can't get here today and we will reschedule the CT

## 2016-07-18 NOTE — Telephone Encounter (Signed)
Patient notified that no additional blood work needed for CT

## 2016-07-18 NOTE — Telephone Encounter (Signed)
  Follow up Call-  Call back number 07/13/2016  Post procedure Call Back phone  # [phone]  Permission to leave phone message Yes  Some recent data might be hidden     Patient questions:  Do you have a fever, pain , or abdominal swelling? No. Pain Score  0 *  Have you tolerated food without any problems? Yes.    Have you been able to return to your normal activities? Yes.    Do you have any questions about your discharge instructions: Diet   No. Medications  No. Follow up visit  No.  Do you have questions or concerns about your Care? No.  Actions: * If pain score is 4 or above: No action needed, pain <4.

## 2016-07-19 ENCOUNTER — Inpatient Hospital Stay: Admission: RE | Admit: 2016-07-19 | Payer: Medicare Other | Source: Ambulatory Visit

## 2016-07-20 ENCOUNTER — Other Ambulatory Visit (INDEPENDENT_AMBULATORY_CARE_PROVIDER_SITE_OTHER): Payer: Medicare Other

## 2016-07-20 DIAGNOSIS — R1084 Generalized abdominal pain: Secondary | ICD-10-CM | POA: Diagnosis not present

## 2016-07-20 DIAGNOSIS — R195 Other fecal abnormalities: Secondary | ICD-10-CM

## 2016-07-20 LAB — HEPATIC FUNCTION PANEL
ALT: 18 U/L (ref 0–53)
AST: 23 U/L (ref 0–37)
Albumin: 4.4 g/dL (ref 3.5–5.2)
Alkaline Phosphatase: 59 U/L (ref 39–117)
Bilirubin, Direct: 0.2 mg/dL (ref 0.0–0.3)
TOTAL PROTEIN: 7.1 g/dL (ref 6.0–8.3)
Total Bilirubin: 0.6 mg/dL (ref 0.2–1.2)

## 2016-07-20 LAB — LIPASE: LIPASE: 4 U/L — AB (ref 11.0–59.0)

## 2016-07-20 LAB — CBC WITH DIFFERENTIAL/PLATELET
BASOS ABS: 0 10*3/uL (ref 0.0–0.1)
Basophils Relative: 0.5 % (ref 0.0–3.0)
EOS ABS: 0.2 10*3/uL (ref 0.0–0.7)
Eosinophils Relative: 1.9 % (ref 0.0–5.0)
HEMATOCRIT: 39.8 % (ref 39.0–52.0)
HEMOGLOBIN: 13.9 g/dL (ref 13.0–17.0)
LYMPHS PCT: 30.7 % (ref 12.0–46.0)
Lymphs Abs: 2.7 10*3/uL (ref 0.7–4.0)
MCHC: 34.9 g/dL (ref 30.0–36.0)
MCV: 98.5 fl (ref 78.0–100.0)
Monocytes Absolute: 0.6 10*3/uL (ref 0.1–1.0)
Monocytes Relative: 7 % (ref 3.0–12.0)
Neutro Abs: 5.2 10*3/uL (ref 1.4–7.7)
Neutrophils Relative %: 59.9 % (ref 43.0–77.0)
PLATELETS: 234 10*3/uL (ref 150.0–400.0)
RBC: 4.04 Mil/uL — ABNORMAL LOW (ref 4.22–5.81)
RDW: 13.3 % (ref 11.5–15.5)
WBC: 8.7 10*3/uL (ref 4.0–10.5)

## 2016-07-20 LAB — BASIC METABOLIC PANEL
BUN: 7 mg/dL (ref 6–23)
CHLORIDE: 103 meq/L (ref 96–112)
CO2: 30 meq/L (ref 19–32)
CREATININE: 0.81 mg/dL (ref 0.40–1.50)
Calcium: 9.3 mg/dL (ref 8.4–10.5)
GFR: 100.62 mL/min (ref >60.00)
Glucose, Bld: 117 mg/dL — ABNORMAL HIGH (ref 70–99)
Potassium: 3.8 mEq/L (ref 3.5–5.1)
Sodium: 141 mEq/L (ref 135–145)

## 2016-07-20 LAB — TSH: TSH: 2.25 u[IU]/mL (ref 0.35–4.50)

## 2016-07-21 ENCOUNTER — Encounter: Payer: Self-pay | Admitting: Gastroenterology

## 2016-07-21 ENCOUNTER — Other Ambulatory Visit: Payer: Medicare Other

## 2016-07-28 ENCOUNTER — Inpatient Hospital Stay: Admission: RE | Admit: 2016-07-28 | Payer: Medicare Other | Source: Ambulatory Visit
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
## 2016-08-01 ENCOUNTER — Encounter: Payer: Self-pay | Admitting: Endocrinology

## 2016-08-01 ENCOUNTER — Ambulatory Visit (INDEPENDENT_AMBULATORY_CARE_PROVIDER_SITE_OTHER): Payer: Medicare Other | Admitting: Endocrinology

## 2016-08-01 VITALS — BP 138/84 | HR 69 | Ht 66.0 in | Wt 176.0 lb

## 2016-08-01 DIAGNOSIS — E1159 Type 2 diabetes mellitus with other circulatory complications: Secondary | ICD-10-CM

## 2016-08-01 MED ORDER — METFORMIN HCL ER 500 MG PO TB24: 1500.0000 mg | ORAL_TABLET | Freq: Every day | ORAL | 11 refills | Status: DC

## 2016-08-01 NOTE — Patient Instructions (Addendum)
check your blood sugar once a day.  vary the time of day when you check, between before the 3 meals, and at bedtime.  also check if you have symptoms of your blood sugar being too high or too low.  please keep a record of the readings and bring it to your next appointment here (or you can bring the meter itself).  You can write it on any piece of paper.  please call us sooner if your blood sugar goes below 70, or if you have a lot of readings over 200. Please change the metformin to extended-release, 3x500 mg daily.    Please come back for a follow-up appointment in 6 months.

## 2016-08-05 ENCOUNTER — Inpatient Hospital Stay: Admission: RE | Admit: 2016-08-05 | Payer: Medicare Other | Source: Ambulatory Visit

## 2016-08-12 ENCOUNTER — Inpatient Hospital Stay: Admission: RE | Admit: 2016-08-12 | Payer: Medicare Other | Source: Ambulatory Visit

## 2016-08-16 ENCOUNTER — Encounter: Payer: Self-pay | Admitting: *Deleted

## 2016-08-19 ENCOUNTER — Ambulatory Visit (INDEPENDENT_AMBULATORY_CARE_PROVIDER_SITE_OTHER)
Admission: RE | Admit: 2016-08-19 | Discharge: 2016-08-19 | Disposition: A | Discharge status: Home / Self Care | Payer: Medicare Other | Source: Ambulatory Visit | Attending: Gastroenterology | Admitting: Gastroenterology

## 2016-08-19 DIAGNOSIS — R1084 Generalized abdominal pain: Secondary | ICD-10-CM

## 2016-08-19 MED ORDER — IOPAMIDOL (ISOVUE-300) INJECTION 61%: 100.0000 mL | Freq: Once | INTRAVENOUS | Status: DC | PRN

## 2016-08-23 ENCOUNTER — Telehealth: Payer: Self-pay | Admitting: Gastroenterology

## 2016-08-23 NOTE — Telephone Encounter (Signed)
See result note.  

## 2016-08-29 ENCOUNTER — Telehealth: Payer: Self-pay | Admitting: Gastroenterology

## 2016-08-29 ENCOUNTER — Ambulatory Visit: Payer: Self-pay | Admitting: Surgery

## 2016-08-29 NOTE — Telephone Encounter (Signed)
All questions answered.  He will call back for additional questions or concerns.  

## 2016-08-30 ENCOUNTER — Other Ambulatory Visit (HOSPITAL_COMMUNITY): Payer: Self-pay | Admitting: Surgery

## 2016-08-30 DIAGNOSIS — R1084 Generalized abdominal pain: Secondary | ICD-10-CM

## 2016-09-02 ENCOUNTER — Encounter: Payer: Self-pay | Admitting: Internal Medicine

## 2016-09-02 ENCOUNTER — Ambulatory Visit (INDEPENDENT_AMBULATORY_CARE_PROVIDER_SITE_OTHER): Payer: Medicare Other | Admitting: Internal Medicine

## 2016-09-02 VITALS — BP 157/83 | HR 81 | Ht 66.0 in | Wt 176.8 lb

## 2016-09-02 DIAGNOSIS — I10 Essential (primary) hypertension: Secondary | ICD-10-CM

## 2016-09-02 DIAGNOSIS — I25810 Atherosclerosis of coronary artery bypass graft(s) without angina pectoris: Secondary | ICD-10-CM

## 2016-09-02 DIAGNOSIS — E785 Hyperlipidemia, unspecified: Secondary | ICD-10-CM

## 2016-09-02 MED ORDER — AMLODIPINE BESYLATE 5 MG PO TABS: 5.0000 mg | ORAL_TABLET | Freq: Every day | ORAL | 3 refills | Status: DC

## 2016-09-02 NOTE — Patient Instructions (Signed)
Your physician has recommended you make the following change in your medication:  1.) INCREASE AMLODIPINE TO 5 MG ONCE DAILY  Your physician wants you to follow-up in: July/Aug 2018 with Dr. Harrington Challenger.  You will receive a reminder letter in the mail two months in advance. If you don't receive a letter, please call our office to schedule the follow-up appointment.
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
## 2016-09-06 NOTE — Telephone Encounter (Signed)
Patient was evaluated by Dr. Brantley Stage at [facility] on 08/29/16.

## 2016-09-09 ENCOUNTER — Ambulatory Visit (HOSPITAL_COMMUNITY): Payer: Medicare Other

## 2016-09-09 ENCOUNTER — Other Ambulatory Visit (HOSPITAL_COMMUNITY): Payer: Medicare Other

## 2016-09-12 ENCOUNTER — Encounter: Payer: Medicare Other | Admitting: Gastroenterology

## 2016-09-23 ENCOUNTER — Other Ambulatory Visit: Payer: Self-pay | Admitting: Internal Medicine

## 2016-12-20 ENCOUNTER — Other Ambulatory Visit: Payer: Self-pay | Admitting: Orthopedic Surgery

## 2016-12-20 DIAGNOSIS — M1612 Unilateral primary osteoarthritis, left hip: Secondary | ICD-10-CM

## 2016-12-22 ENCOUNTER — Other Ambulatory Visit: Payer: Medicare Other

## 2016-12-25 ENCOUNTER — Ambulatory Visit
Admission: RE | Admit: 2016-12-25 | Discharge: 2016-12-25 | Disposition: A | Discharge status: Home / Self Care | Payer: Medicare Other | Source: Ambulatory Visit | Attending: Orthopedic Surgery | Admitting: Orthopedic Surgery

## 2016-12-25 DIAGNOSIS — M1612 Unilateral primary osteoarthritis, left hip: Secondary | ICD-10-CM

## 2016-12-26 ENCOUNTER — Other Ambulatory Visit: Payer: Medicare Other

## 2017-01-01 ENCOUNTER — Other Ambulatory Visit: Payer: Medicare Other

## 2017-01-26 ENCOUNTER — Other Ambulatory Visit: Payer: Self-pay | Admitting: Cardiology

## 2017-01-29 ENCOUNTER — Other Ambulatory Visit: Payer: Self-pay | Admitting: Cardiology

## 2017-01-30 NOTE — Telephone Encounter (Signed)
Medication Detail    Disp Refills Start End   metoprolol succinate (TOPROL-XL) 25 MG 24 hr tablet 30 tablet 10 01/26/2017    Sig - Route: TAKE 1 TABLET (25 MG TOTAL) BY MOUTH DAILY. TAKE WITH OR IMMEDIATELY FOLLOWING A MEAL. - Oral   E-Prescribing Status: Receipt confirmed by pharmacy (01/26/2017 7:41 AM EDT)   Pharmacy   [id number] - [place], [place].

## 2017-02-15 ENCOUNTER — Ambulatory Visit (INDEPENDENT_AMBULATORY_CARE_PROVIDER_SITE_OTHER): Payer: Medicare Other | Admitting: Gastroenterology

## 2017-02-15 ENCOUNTER — Encounter: Payer: Self-pay | Admitting: Gastroenterology

## 2017-02-15 ENCOUNTER — Telehealth: Payer: Self-pay | Admitting: Gastroenterology

## 2017-02-15 ENCOUNTER — Ambulatory Visit: Payer: Medicare Other | Admitting: Gastroenterology

## 2017-02-15 VITALS — BP 140/80 | HR 80 | Ht 66.0 in | Wt 169.0 lb

## 2017-02-15 DIAGNOSIS — A0472 Enterocolitis due to Clostridium difficile, not specified as recurrent: Secondary | ICD-10-CM

## 2017-02-15 DIAGNOSIS — R1030 Lower abdominal pain, unspecified: Secondary | ICD-10-CM | POA: Diagnosis not present

## 2017-02-15 MED ORDER — SACCHAROMYCES BOULARDII 250 MG PO CAPS: 250.0000 mg | ORAL_CAPSULE | Freq: Two times a day (BID) | ORAL | 1 refills | Status: DC

## 2017-02-15 MED ORDER — DICYCLOMINE HCL 10 MG PO CAPS: 10.0000 mg | ORAL_CAPSULE | Freq: Three times a day (TID) | ORAL | 0 refills | Status: DC | PRN

## 2017-02-15 MED ORDER — VANCOMYCIN HCL 125 MG PO CAPS: 125.0000 mg | ORAL_CAPSULE | Freq: Four times a day (QID) | ORAL | 0 refills | Status: AC

## 2017-02-15 NOTE — Telephone Encounter (Signed)
Patient decided to keep appt.

## 2017-02-15 NOTE — Progress Notes (Signed)
    History of Present Illness: This is a 71 year old male with C. difficile. He is accompanied by his daughter. He developed abdominal pain and diarrhea with blood and mucus a bout 4 weeks ago. He presented to his PCPs office and stool studies revealed C. difficile toxin positive on June 7. He was treated with a 10 day course of Flagyl and vancomycin. His symptoms have improved but have not resolved. He is having 4-6 loose stools per day with small amounts of blood and mucus and mild lower abdominal pain. No fevers. He is eating well.  Colonoscopy 06/2016  - One 7 mm polyp in the sigmoid colon, removed with a cold snare. Resected and retrieved. - One 4 mm polyp in the sigmoid colon, removed with a cold biopsy forceps. Resected and retrieved. - Mild diverticulosis in the sigmoid colon. There was no evidence of diverticular bleeding. - The examination was otherwise normal on direct and retroflexion views.  Abd/pelvic CT 07/2016 IMPRESSION: 1. No acute inflammatory process within abdomen. 2. Small hiatal hernia. 3. Mild fatty infiltration of the liver. 4. Tiny layering gallstones are noted within gallbladder the largest measures 3.5 mm. 5. Mild atrophic pancreas without focal abnormality. 6. No pericecal inflammation.  Appendix is not identified. 7. Stable mild compression deformity upper endplate of S85 vertebral body.  Current Medications, Allergies, Past Medical History, Past Surgical History, Family History and Social History were reviewed in [employer] record.  Physical Exam: General: Well developed, well nourished, no acute distress Head: Normocephalic and atraumatic Eyes:  sclerae anicteric, EOMI Ears: Normal auditory acuity Mouth: No deformity or lesions Lungs: Clear throughout to auscultation Heart: Regular rate and rhythm; no murmurs, rubs or bruits Abdomen: Soft, mild lower abdominal tenderness and non distended. No masses, hepatosplenomegaly or hernias  noted. Normal Bowel sounds Rectal: not done Musculoskeletal: Symmetrical with no gross deformities  Pulses:  Normal pulses noted Extremities: No clubbing, cyanosis, edema or deformities noted Neurological: Alert oriented x 4, grossly nonfocal Psychological:  Alert and cooperative. Normal mood and affect  Assessment and Recommendations:  1. C. difficile colitis. Continue vancomycin 125 mg 4 times a day for 14 days. Florastor twice a day for 6 weeks. Dicyclomine 10 mg 3 times a day when necessary. Low fat, low residue diet until symptoms improve. Return visit one month.

## 2017-02-15 NOTE — Patient Instructions (Signed)
We have sent the following medications to your pharmacy for you to pick up at your convenience:   Vancomycin, 125mg  Take 1 tablet four times a day for 14 days  Florastor Probiotic Take 1 tablet twice a day for 6 weeks  Dicyclomine 10mg  Take 1 tablet three times a day, as needed  Thank you for choosing me and [facility].  Pricilla Riffle. Dagoberto Ligas., MD., Marval Regal

## 2017-03-06 ENCOUNTER — Telehealth: Payer: Self-pay | Admitting: Gastroenterology

## 2017-03-06 MED ORDER — SACCHAROMYCES BOULARDII 250 MG PO CAPS: 250.0000 mg | ORAL_CAPSULE | Freq: Two times a day (BID) | ORAL | 1 refills | Status: AC

## 2017-03-06 MED ORDER — VANCOMYCIN HCL 125 MG PO CAPS: 125.0000 mg | ORAL_CAPSULE | Freq: Four times a day (QID) | ORAL | 0 refills | Status: DC

## 2017-03-06 MED ORDER — SACCHAROMYCES BOULARDII 250 MG PO CAPS: 250.0000 mg | ORAL_CAPSULE | Freq: Two times a day (BID) | ORAL | 1 refills | Status: DC

## 2017-03-06 NOTE — Telephone Encounter (Signed)
Repeat vancomycin 125 mg po qid for 14 days. Florastor bid for 6 weeks Bland diet with no lactose products, no raw fruits, no raw vegetables, no greasy/high fat foods.

## 2017-03-06 NOTE — Telephone Encounter (Signed)
Daughter notified of new recommendations rx sent They will call back for additional concerns.

## 2017-03-06 NOTE — Telephone Encounter (Signed)
Patient's daughter reports that he is having multiple episodes of diarrhea with mucus daily. He completed the vancomycin yesterday.  Stools never became normal while on vancomycin.  I sent a refill of florastor to his pharmacy.  Dr. Fuller Plan please advise about continued diarrhea.

## 2017-03-10 ENCOUNTER — Other Ambulatory Visit: Payer: Self-pay | Admitting: Internal Medicine

## 2017-03-23 ENCOUNTER — Ambulatory Visit (INDEPENDENT_AMBULATORY_CARE_PROVIDER_SITE_OTHER): Payer: Medicare Other | Admitting: Gastroenterology

## 2017-03-23 ENCOUNTER — Encounter: Payer: Self-pay | Admitting: Gastroenterology

## 2017-03-23 VITALS — BP 140/70 | HR 68 | Ht 66.0 in | Wt 171.0 lb

## 2017-03-23 DIAGNOSIS — A09 Infectious gastroenteritis and colitis, unspecified: Secondary | ICD-10-CM

## 2017-03-23 DIAGNOSIS — A0472 Enterocolitis due to Clostridium difficile, not specified as recurrent: Secondary | ICD-10-CM

## 2017-03-23 MED ORDER — VANCOMYCIN HCL 125 MG PO CAPS: ORAL_CAPSULE | ORAL | 0 refills | Status: DC

## 2017-03-23 MED ORDER — CLOTRIMAZOLE 1 % EX CREA: 1.0000 "application " | TOPICAL_CREAM | Freq: Two times a day (BID) | CUTANEOUS | 0 refills | Status: AC

## 2017-03-23 NOTE — Patient Instructions (Addendum)
We have sent the following medications to your pharmacy for you to pick up at your convenience: Vancomycin taper: 125 mg by mouth 2 x daily 7 days, then take 125 mg by mouth 1 x daily x 7 days, then 125 mg by every other day x 8 days, then 125 mg by mouth every 3 days x 2 weeks.   Please continue Florastor twice daily 4 weeks after you finish vancomycin taper.   You can take over the counter Imodium twice daily as needed.   Stop eating yogurt.   We have sent the following medications to your pharmacy for you to pick up at your convenience: Lotrimin cream.  If you symptoms do not go away after the one week treatment with Lotrimin cream then please contact your primary care physician.   Normal BMI (Body Mass Index- based on height and weight) is between 23 and 30. Your BMI today is Body mass index is 27.6 kg/m. Marland Kitchen Please consider follow up  regarding your BMI with your Primary Care Provider.  Thank you for choosing me and [facility].  Pricilla Riffle. Dagoberto Ligas., MD., Marval Regal

## 2017-03-23 NOTE — Progress Notes (Signed)
    History of Present Illness: This is a 71 year old male with C. difficile diarrhea returning for follow-up. He is accompanied by his daughter. He has taken 2 courses of vancomycin and states he has been compliant with therapy however his symptoms have not completely resolved. His symptoms have substantially improved but he continues to have 3-4 semisolid bowel movements per day which is an improvement. Abdominal cramping has substantially improved and he has only used dicyclomine on occasion the past week. Pt complains of jock itch for several weeks. Red, irritated, itchy skin around groin, scrotum.   Current Medications, Allergies, Past Medical History, Past Surgical History, Family History and Social History were reviewed in [employer] record.  Physical Exam: General: Well developed, well nourished, no acute distress Head: Normocephalic and atraumatic Eyes:  sclerae anicteric, EOMI Ears: Normal auditory acuity Mouth: No deformity or lesions Lungs: Clear throughout to auscultation Heart: Regular rate and rhythm; no murmurs, rubs or bruits Abdomen: Soft, non tender and non distended. No masses, hepatosplenomegaly or hernias noted. Normal Bowel sounds Musculoskeletal: Symmetrical with no gross deformities  Pulses:  Normal pulses noted Extremities: No clubbing, cyanosis, edema or deformities noted Neurological: Alert oriented x 4, grossly nonfocal Psychological:  Alert and cooperative. Normal mood and affect  Assessment and Recommendations:  1. C difficile colitis. Vancomycin pulse taper. Continue Florastor bid and stop 1 month after finishing all vancomycin. Imodium twice a day when necessary. DC all lactose products. If diarrhea persists repeat GI pathogen panel. Consider imaging, colonoscopy and additional stool studies for further evaluation if symptoms persist. Continue lactose-free, low fat, no raw fruits, no rales vegetable diet until diarrheas resolves. REV in  4 weeks.   2. Suspected tinea cruris. Lotrimin AF 3 times a day topically for 7  days. If symptoms have not resolved he is advised to contact his PCP.

## 2017-04-10 ENCOUNTER — Ambulatory Visit: Payer: Medicare Other | Admitting: Gastroenterology

## 2017-04-20 ENCOUNTER — Other Ambulatory Visit: Payer: Medicare Other

## 2017-04-20 ENCOUNTER — Ambulatory Visit (INDEPENDENT_AMBULATORY_CARE_PROVIDER_SITE_OTHER): Payer: Medicare Other | Admitting: Physician Assistant

## 2017-04-20 ENCOUNTER — Encounter: Payer: Self-pay | Admitting: Physician Assistant

## 2017-04-20 VITALS — BP 140/64 | HR 63 | Ht 66.0 in | Wt 170.0 lb

## 2017-04-20 DIAGNOSIS — A0472 Enterocolitis due to Clostridium difficile, not specified as recurrent: Secondary | ICD-10-CM

## 2017-04-20 MED ORDER — FIDAXOMICIN 200 MG PO TABS: 200.0000 mg | ORAL_TABLET | Freq: Two times a day (BID) | ORAL | 0 refills | Status: DC

## 2017-04-20 MED ORDER — DICYCLOMINE HCL 10 MG PO CAPS: 10.0000 mg | ORAL_CAPSULE | Freq: Three times a day (TID) | ORAL | 2 refills | Status: DC

## 2017-04-20 MED ORDER — VANCOMYCIN HCL 125 MG PO CAPS: ORAL_CAPSULE | ORAL | 0 refills | Status: DC

## 2017-04-20 MED ORDER — SACCHAROMYCES BOULARDII 250 MG PO CAPS: 250.0000 mg | ORAL_CAPSULE | Freq: Two times a day (BID) | ORAL | 0 refills | Status: DC

## 2017-04-20 NOTE — Patient Instructions (Addendum)
Please go to the basement level to our lab for a stool test. We have sent the following medications to your pharmacy for you to pick up at your convenience: [facility] 1.Vancomycin 125 mg 2. Florastor  3. Bentyl 4. Dificid 200 mg, if too expensive let us know. Do not pick it up. We wil try to get some samples.

## 2017-04-20 NOTE — Progress Notes (Signed)
Subjective:    Patient ID: [name], male    DOB: August 21, 1946, 71 y.o.   MRN: [id number]  HPI [name] is a pleasant 71 year old white male, known to Dr. Fuller Plan who comes in today for follow-up of relapsing C. difficile colitis. He was last seen in the office on 03/23/2017. At that point he had completed 2 courses of vancomycin earlier in the summer after having a positive C. difficile toxin in June 2018. His symptoms had improved with the courses of antibiotics but had not completely resolved and he was having 3-4 semisolid stools per day and some lower abdominal discomfort. He was continued on floor store twice daily for 1 month and instructed to take a tapered pulse dose course of vancomycin 125 mg twice a day 7 days then daily 7 days thank you right eye 7 days then every other day for week. He comes in today stating that he did feel better while on the higher doses of vancomycin but over the past week he has developed some increase in lower abdominal pain and cramping and continues to have 4-6 diarrheal stools per day. He still having to get up at night usually once with the diarrheal episode. Passing a lot of mucus in his stool, no blood. No fever or chills. No nausea or vomiting in appetite has been okay.  Last colonoscopy November 2017-mild diverticulosis and 2 polyps were removed.  Review of Systems Pertinent positive and negative review of systems were noted in the above HPI section.  All other review of systems was otherwise negative.  Outpatient Encounter Prescriptions as of 04/20/2017  Medication Sig  . aspirin 81 MG tablet Take 81 mg by mouth daily.  Marland Kitchen azelastine (OPTIVAR) 0.05 % ophthalmic solution Place 1 drop into both eyes 2 (two) times daily.  Marland Kitchen dicyclomine (BENTYL) 10 MG capsule Take 1 capsule (10 mg total) by mouth 3 (three) times daily before meals.  . dorzolamide-timolol (COSOPT) 22.3-6.8 MG/ML ophthalmic solution Place 1 drop into both eyes daily.  . furosemide  (LASIX) 40 MG tablet Take 20 mg by mouth every 12 (twelve) hours. Take one and one half tabs every day  . gabapentin (NEURONTIN) 600 MG tablet Take 600 mg by mouth 3 (three) times daily.  . hydrOXYzine (ATARAX/VISTARIL) 25 MG tablet Take 25 mg by mouth as directed.  . latanoprost (XALATAN) 0.005 % ophthalmic solution Place 1 drop into both eyes at bedtime.   Marland Kitchen lisinopril (PRINIVIL,ZESTRIL) 20 MG tablet TAKE 1 TABLET (20 MG TOTAL) BY MOUTH DAILY.  . metFORMIN (GLUCOPHAGE-XR) 500 MG 24 hr tablet Take 3 tablets (1,500 mg total) by mouth daily with breakfast.  . methocarbamol (ROBAXIN) 750 MG tablet Take 750 mg by mouth 3 (three) times daily.   . metoprolol succinate (TOPROL-XL) 25 MG 24 hr tablet TAKE 1 TABLET (25 MG TOTAL) BY MOUTH DAILY. TAKE WITH OR IMMEDIATELY FOLLOWING A MEAL.  . nitroGLYCERIN (NITROSTAT) 0.4 MG SL tablet Place 1 tablet (0.4 mg total) under the tongue every 5 (five) minutes as needed for chest pain (x 3 doses).  . ONE TOUCH ULTRA TEST test strip USE 1 TIME DAILY TO CHECK BLOOD SUGAR.  Marland Kitchen Oxycodone HCl (OXYCONTIN) 60 MG TB12 Take 1 tablet by mouth every 8 (eight) hours.   Marland Kitchen oxyCODONE-acetaminophen (PERCOCET) 10-325 MG tablet Take 1 tablet by mouth every 4 (four) hours.  . potassium chloride SA (K-DUR,KLOR-CON) 20 MEQ tablet Take 20 mEq by mouth once.  . simvastatin (ZOCOR) 40 MG tablet TAKE 1 TABLET  BY MOUTH AT BEDTIME  . vancomycin (VANCOCIN HCL) 125 MG capsule 125 mg, take 1 cap 4 x daily x 14 days, 125 mg, take 1 cap 3 times daily x 7 days, 125 mg, take 1 cap twice daily x 7 days, then 125 mg, take 1 cap daily x 7 days, then 125 mg, 1 cap every other day x 7 days  . VENTOLIN HFA 108 (90 BASE) MCG/ACT inhaler Inhale 2 puffs into the lungs 2 (two) times daily as needed for shortness of breath.   . [DISCONTINUED] dicyclomine (BENTYL) 10 MG capsule Take 1 capsule (10 mg total) by mouth 3 (three) times daily as needed (abdominal pain).  . [DISCONTINUED] vancomycin (VANCOCIN HCL) 125  MG capsule 125 mg by mouth 2 x daily 7 days, then take 125 mg by mouth 1 x daily x 7 days, then 125 mg by every other day x 8 days, then 125 mg by mouth every 3 days x 2 weeks.  Marland Kitchen amLODipine (NORVASC) 5 MG tablet Take 1 tablet (5 mg total) by mouth daily.  . fidaxomicin (DIFICID) 200 MG TABS tablet Take 1 tablet (200 mg total) by mouth 2 (two) times daily.  Marland Kitchen saccharomyces boulardii (FLORASTOR) 250 MG capsule Take 1 capsule (250 mg total) by mouth 2 (two) times daily.   Facility-Administered Encounter Medications as of 04/20/2017  Medication  . 0.9 %  sodium chloride infusion   Allergies  Allergen Reactions  . Bupropion Hcl Other (See Comments)    "messes with my head"  . Doxycycline Hyclate Diarrhea       . Tetracycline Diarrhea  . Verapamil Other (See Comments)    "messes with my head"   Patient Active Problem List   Diagnosis Date Noted  . Chest pain 09/15/2014  . HYPERCHOLESTEROLEMIA 10/27/2009  . SMOKER 10/27/2009  . DEPRESSION 10/27/2009  . OSTEOARTHRITIS, LUMBAR SPINE 10/27/2009  . COUGH 10/27/2009  . NUMBNESS 08/28/2008  . ESOPHAGEAL STRICTURE 12/10/2007  . GERD 12/10/2007  . DUODENITIS 12/10/2007  . TRANSAMINASES, SERUM, ELEVATED 12/10/2007  . Diabetes ([place]) 05/02/2007  . Essential hypertension 05/02/2007  . Coronary atherosclerosis 05/02/2007  . ALLERGIC RHINITIS 05/02/2007   Social History   Social History  . Marital status: Married    Spouse name: [name]  . Number of children: 2  . Years of education: [name]   Occupational History  . Disabled Unemployed   Social History Main Topics  . Smoking status: Current Every Day Smoker    Packs/day: 1.50    Years: 56.00    Types: Cigarettes  . Smokeless tobacco: Never Used  . Alcohol use No  . Drug use: No  . Sexual activity: Not on file   Other Topics Concern  . Not on file   Social History Narrative  . No narrative on file    Mr. Kwan family history includes Cancer in his father and mother; Diabetes  in his brother and sister; Heart disease in his father.      Objective:    Vitals:   04/20/17 1035  BP: 140/64  Pulse: 63    Physical Exam  well-developed older white male in no acute distress, accompanied by his daughter blood pressure 140/64 pulse 63, BMI 27.4. 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 Patient has had some improvement with vancomycin but has dosed tapered symptoms have increased. #2 diverticulosis #3 adenomatous colon polyps #4 adult-onset diabetes mellitus #5 coronary artery disease #6 history of GERD with previous stricture  Plan; we will increase vancomycin 225 mg by mouth 4 times daily 2 more weeks then decrease to 125 3 times a day 1 week then decrease to 125 mg by mouth twice a day 1 week, then 125 mg by mouth daily 1 week then 125 mg every other day 1 week. We will also go ahead and send a prescription for Dificid 200 mg by mouth twice a day 10 days. If his insurance will not cover will attempt to get samples Continue floor store one by mouth twice a day Refill Bentyl 10 mg by mouth 3 times a day which has been helpful for cramping and pain. Check stool for C. difficile by PCR today Will follow patient up in 3-4 weeks. Briefly discussed fecal microbial transplant with patient and his daughter today should he continue to have symptoms refractory to above regimen.   Amy S Esterwood PA-C 04/20/2017   Cc: [name], *

## 2017-04-20 NOTE — Progress Notes (Signed)
Reviewed and agree with management plan. If we can obtain Dificid would treat with Dificid 200 mg po bid for 10 days and stop vanco.  FMT would be next step if he fails Dificid, or if we cannot obtain Dificid then a repeat course of vanco pulse taper at a higher dose.  Pricilla Riffle. Fuller Plan, MD [facility]

## 2017-04-21 ENCOUNTER — Other Ambulatory Visit: Payer: Medicare Other

## 2017-04-21 ENCOUNTER — Telehealth: Payer: Self-pay

## 2017-04-21 DIAGNOSIS — A0472 Enterocolitis due to Clostridium difficile, not specified as recurrent: Secondary | ICD-10-CM

## 2017-04-21 NOTE — Telephone Encounter (Signed)
Patient said the Dificid is affordable as far he he is concerned. He is confused on which antibiotic to take because he had a different understanding at the visit.[name] Please clarify and I will instruct him.

## 2017-04-21 NOTE — Telephone Encounter (Signed)
Clarified instructions to the patient based on Dr Lynne Leader recommendations. The patient will take Dificid 200 mg BID for 10 days. He is asked to call with an update the day before his last doses.

## 2017-04-21 NOTE — Telephone Encounter (Signed)
Left a message to please call me back about his prescriptions from 04/20/17.

## 2017-04-22 LAB — CLOSTRIDIUM DIFFICILE BY PCR: Toxigenic C. Difficile by PCR: NOT DETECTED

## 2017-04-28 ENCOUNTER — Telehealth: Payer: Self-pay | Admitting: Physician Assistant

## 2017-04-28 NOTE — Telephone Encounter (Signed)
Patient reports "some improvement but not 100% yet" regarding the loose stools. He will continue his medications and call with an update next week.

## 2017-05-03 ENCOUNTER — Other Ambulatory Visit: Payer: Self-pay | Admitting: Cardiology

## 2017-05-04 NOTE — Telephone Encounter (Signed)
Please call office and schedule appointment for further refills. 

## 2017-05-08 ENCOUNTER — Telehealth: Payer: Self-pay | Admitting: Physician Assistant

## 2017-05-08 ENCOUNTER — Ambulatory Visit: Payer: Medicare Other | Admitting: Physician Assistant

## 2017-05-23 ENCOUNTER — Encounter: Payer: Self-pay | Admitting: Physician Assistant

## 2017-05-23 ENCOUNTER — Ambulatory Visit (INDEPENDENT_AMBULATORY_CARE_PROVIDER_SITE_OTHER): Payer: Medicare Other | Admitting: Physician Assistant

## 2017-05-23 ENCOUNTER — Other Ambulatory Visit (INDEPENDENT_AMBULATORY_CARE_PROVIDER_SITE_OTHER): Payer: Medicare Other

## 2017-05-23 VITALS — BP 104/60 | HR 78 | Ht 66.0 in | Wt 172.0 lb

## 2017-05-23 DIAGNOSIS — R197 Diarrhea, unspecified: Secondary | ICD-10-CM

## 2017-05-23 DIAGNOSIS — A0472 Enterocolitis due to Clostridium difficile, not specified as recurrent: Secondary | ICD-10-CM

## 2017-05-23 LAB — CBC WITH DIFFERENTIAL/PLATELET
BASOS PCT: 1.1 % (ref 0.0–3.0)
Basophils Absolute: 0.1 10*3/uL (ref 0.0–0.1)
EOS ABS: 0.3 10*3/uL (ref 0.0–0.7)
Eosinophils Relative: 3.7 % (ref 0.0–5.0)
HCT: 41.7 % (ref 39.0–52.0)
Hemoglobin: 14.2 g/dL (ref 13.0–17.0)
Lymphocytes Relative: 24.2 % (ref 12.0–46.0)
Lymphs Abs: 2.1 10*3/uL (ref 0.7–4.0)
MCHC: 34.1 g/dL (ref 30.0–36.0)
MCV: 99.8 fl (ref 78.0–100.0)
MONO ABS: 0.5 10*3/uL (ref 0.1–1.0)
Monocytes Relative: 6.2 % (ref 3.0–12.0)
Neutro Abs: 5.5 10*3/uL (ref 1.4–7.7)
Neutrophils Relative %: 64.8 % (ref 43.0–77.0)
Platelets: 207 10*3/uL (ref 150.0–400.0)
RBC: 4.17 Mil/uL — AB (ref 4.22–5.81)
RDW: 13.1 % (ref 11.5–15.5)
WBC: 8.6 10*3/uL (ref 4.0–10.5)

## 2017-05-23 LAB — BASIC METABOLIC PANEL
BUN: 5 mg/dL — ABNORMAL LOW (ref 6–23)
CO2: 29 mEq/L (ref 19–32)
Calcium: 9.2 mg/dL (ref 8.4–10.5)
Chloride: 104 mEq/L (ref 96–112)
Creatinine, Ser: 0.71 mg/dL (ref 0.40–1.50)
GFR: 116.86 mL/min (ref >60.00)
Glucose, Bld: 108 mg/dL — ABNORMAL HIGH (ref 70–99)
POTASSIUM: 4 meq/L (ref 3.5–5.1)
SODIUM: 139 meq/L (ref 135–145)

## 2017-05-23 NOTE — Progress Notes (Signed)
Reviewed and agree with management plan.  Greysin Medlen T. Allure Greaser, MD FACG 

## 2017-05-23 NOTE — Patient Instructions (Signed)
Please go to the basement level to have your labs drawn and stool studies.  Continue Florastor, take 1 tab twice daily. Continue Bentyl 10 mg ,take 1 tablet 3 times daily as needed for cramping, diarrhea.  Vancomycin 250 mg Take 1 tab 4 times daily for 14 days Take 1 tab 3 times daily for 7 days Take 1 tab 2 times daily for 7 days Take 1 tab 1 time daily for 7 days.

## 2017-05-23 NOTE — Progress Notes (Signed)
Subjective:    Patient ID: [name], male    DOB: 1945-11-23, 71 y.o.   MRN: [id number]  HPI [name] is a very nice 71 year old white male, known to Dr. Fuller Plan. He comes back in today for follow-up of relapsing C. difficile colitis.Marland Kitchen He was last seen on 04/20/2017 and at that time had had improvement in symptoms but not resolution of diarrhea after a 6 week tapering course of vancomycin 125 mg Decision was made to treat him with a course of Dificid 200 mg by mouth twice a day which she completed about 2 weeks ago. Interestingly stool for C. difficile PCR was negative on 04/21/2017. But at that time patient was still having up to 10 liquid stools per day. Patient says perhaps he felt a little bit better while he was on the Dificid but continues to have 8-10 loose bowel movements per day with a lot of mucus and occasionally sees a small amount of heme. He has had mild abdominal cramping ongoing no fever or chills. Fortunately appetite has been okay and he has not had any nausea is taking dicyclomine 10 mg 2-3 times daily. Generally not having any nocturnal episodes of diarrhea and rarely having incontinence. He has not been on any other antibiotics and continues on Florastor twice daily.  Review of Systems Pertinent positive and negative review of systems were noted in the above HPI section.  All other review of systems was otherwise negative.  Outpatient Encounter Prescriptions as of 05/23/2017  Medication Sig  . aspirin 81 MG tablet Take 81 mg by mouth daily.  Marland Kitchen azelastine (OPTIVAR) 0.05 % ophthalmic solution Place 1 drop into both eyes 2 (two) times daily.  Marland Kitchen dicyclomine (BENTYL) 10 MG capsule Take 1 capsule (10 mg total) by mouth 3 (three) times daily before meals.  . dorzolamide-timolol (COSOPT) 22.3-6.8 MG/ML ophthalmic solution Place 1 drop into both eyes daily.  . furosemide (LASIX) 40 MG tablet Take 20 mg by mouth every 12 (twelve) hours. Take one and one half tabs every day  .  gabapentin (NEURONTIN) 600 MG tablet Take 600 mg by mouth 3 (three) times daily.  . hydrOXYzine (ATARAX/VISTARIL) 25 MG tablet Take 25 mg by mouth as directed.  . latanoprost (XALATAN) 0.005 % ophthalmic solution Place 1 drop into both eyes at bedtime.   Marland Kitchen lisinopril (PRINIVIL,ZESTRIL) 20 MG tablet TAKE 1 TABLET (20 MG TOTAL) BY MOUTH DAILY.  . metFORMIN (GLUCOPHAGE-XR) 500 MG 24 hr tablet Take 3 tablets (1,500 mg total) by mouth daily with breakfast.  . methocarbamol (ROBAXIN) 750 MG tablet Take 750 mg by mouth 3 (three) times daily.   . metoprolol succinate (TOPROL-XL) 25 MG 24 hr tablet TAKE 1 TABLET (25 MG TOTAL) BY MOUTH DAILY. TAKE WITH OR IMMEDIATELY FOLLOWING A MEAL.  . nitroGLYCERIN (NITROSTAT) 0.4 MG SL tablet PLACE 1 TABLET UNDER THE TONGUE EVERY 5MINUTES AS NEEDED FOR CHEST PAIN X3 DOSES  . ONE TOUCH ULTRA TEST test strip USE 1 TIME DAILY TO CHECK BLOOD SUGAR.  Marland Kitchen Oxycodone HCl (OXYCONTIN) 60 MG TB12 Take 1 tablet by mouth every 8 (eight) hours.   Marland Kitchen oxyCODONE-acetaminophen (PERCOCET) 10-325 MG tablet Take 1 tablet by mouth every 4 (four) hours.  . potassium chloride SA (K-DUR,KLOR-CON) 20 MEQ tablet Take 20 mEq by mouth once.  . saccharomyces boulardii (FLORASTOR) 250 MG capsule Take 1 capsule (250 mg total) by mouth 2 (two) times daily.  . simvastatin (ZOCOR) 40 MG tablet TAKE 1 TABLET BY MOUTH AT BEDTIME  .  VENTOLIN HFA 108 (90 BASE) MCG/ACT inhaler Inhale 2 puffs into the lungs 2 (two) times daily as needed for shortness of breath.   Marland Kitchen amLODipine (NORVASC) 5 MG tablet Take 1 tablet (5 mg total) by mouth daily.  . [DISCONTINUED] fidaxomicin (DIFICID) 200 MG TABS tablet Take 1 tablet (200 mg total) by mouth 2 (two) times daily.  . [DISCONTINUED] vancomycin (VANCOCIN HCL) 125 MG capsule 125 mg, take 1 cap 4 x daily x 14 days, 125 mg, take 1 cap 3 times daily x 7 days, 125 mg, take 1 cap twice daily x 7 days, then 125 mg, take 1 cap daily x 7 days, then 125 mg, 1 cap every other day x 7  days   Facility-Administered Encounter Medications as of 05/23/2017  Medication  . 0.9 %  sodium chloride infusion   Allergies  Allergen Reactions  . Bupropion Hcl Other (See Comments)    "messes with my head"  . Doxycycline Hyclate Diarrhea       . Tetracycline Diarrhea  . Verapamil Other (See Comments)    "messes with my head"   Patient Active Problem List   Diagnosis Date Noted  . Chest pain 09/15/2014  . HYPERCHOLESTEROLEMIA 10/27/2009  . SMOKER 10/27/2009  . DEPRESSION 10/27/2009  . OSTEOARTHRITIS, LUMBAR SPINE 10/27/2009  . COUGH 10/27/2009  . NUMBNESS 08/28/2008  . ESOPHAGEAL STRICTURE 12/10/2007  . GERD 12/10/2007  . DUODENITIS 12/10/2007  . TRANSAMINASES, SERUM, ELEVATED 12/10/2007  . Diabetes ([place]) 05/02/2007  . Essential hypertension 05/02/2007  . Coronary atherosclerosis 05/02/2007  . ALLERGIC RHINITIS 05/02/2007   Social History   Social History  . Marital status: Married    Spouse name: [name]  . Number of children: 2  . Years of education: [name]   Occupational History  . Disabled Unemployed   Social History Main Topics  . Smoking status: Current Every Day Smoker    Packs/day: 1.50    Years: 56.00    Types: Cigarettes  . Smokeless tobacco: Never Used     Comment: tobacco info given 05/23/17  . Alcohol use No  . Drug use: No  . Sexual activity: Not on file   Other Topics Concern  . Not on file   Social History Narrative  . No narrative on file    [name] family history includes Cancer in his mother; Diabetes in his brother and sister; Heart disease in his father.      Objective:    Vitals:   05/23/17 1050  BP: 104/60  Pulse: 78    Physical Exam  well-developed older white male in no acute distress, appears older than stated age accompanied by his daughter blood pressure 104/60 pulse 78, height 5 foot 6, weight 172, BMI 27.7. HEENT ;nontraumatic normocephalic EOMI PERRLA sclera anicteric, Cardiovascular ;regular rate and rhythm  with S1-S2 no murmur rub or gallop, Pulmonary; clear bilaterally, Abdomen; has some mild distention bowel sounds are hyperactive no bilateral lower quadrant tenderness is no guarding or rebound no palpable mass or hepatosplenomegaly, Rectal; exam not done, Extremities ;no clubbing cyanosis or edema skin warm and dry, Neuropsych; mood and affect appropriate       Assessment & Plan:   #22 71 year old white male with refractory C. difficile colitis with initial diagnosis June 2018. Patient has been on treatment off and on since then. He completed a long tapered course of low-dose vancomycin in August, then completed 10 days of Dificid. He has not been on any treatment over the past 2 weeks  and continues to have 8-10 loose bowel movements per day associated with lower abdominal cramping. #2 hypertension #3 coronary artery disease #4 GERD with history of esophageal stricture #5 adult-onset diabetes mellitus  Plan; CBC with differential, BMET, stool for C. difficile PCR, GI pathogen panel and stool for lactoferrin Patient had picked up a second course of vancomycin and has the prescription filled and at home. He will go ahead and start vancomycin 250 mg by mouth 4 times a day 2 weeks then to 50 mg by mouth 3 times a day 1 week then 250 mg by mouth twice a day 1 week then 250 mg by mouth daily 1 week Continue florastor one by mouth twice a day Continue dicyclomine 2-3 times daily as needed Will wait for repeat stool for C. difficile etc., I will discuss with Dr. Fuller Plan regarding referral to Dr. Baxter [facility] Infectious [facility] to initiate plan for fecal microbial transplant.  Amy S Esterwood PA-C 05/23/2017   Cc: [name], *

## 2017-05-25 ENCOUNTER — Other Ambulatory Visit: Payer: Medicare Other

## 2017-05-25 DIAGNOSIS — R197 Diarrhea, unspecified: Secondary | ICD-10-CM

## 2017-05-25 DIAGNOSIS — A0472 Enterocolitis due to Clostridium difficile, not specified as recurrent: Secondary | ICD-10-CM

## 2017-05-26 LAB — CLOSTRIDIUM DIFFICILE BY PCR: CDIFFPCR: NOT DETECTED

## 2017-05-29 LAB — FECAL LACTOFERRIN, QUANT
FECAL LACTOFERRIN: NEGATIVE
MICRO NUMBER:: 81104236
SPECIMEN QUALITY: ADEQUATE

## 2017-05-29 LAB — GASTROINTESTINAL PATHOGEN PANEL PCR
C. DIFFICILE TOX A/B, PCR: NOT DETECTED
Campylobacter, PCR: NOT DETECTED
Cryptosporidium, PCR: NOT DETECTED
E COLI (ETEC) LT/ST, PCR: NOT DETECTED
E COLI (STEC) STX1/STX2, PCR: NOT DETECTED
E COLI 0157, PCR: NOT DETECTED
Giardia lamblia, PCR: NOT DETECTED
Norovirus, PCR: NOT DETECTED
Rotavirus A, PCR: NOT DETECTED
SALMONELLA, PCR: NOT DETECTED
Shigella, PCR: NOT DETECTED

## 2017-06-23 ENCOUNTER — Ambulatory Visit (AMBULATORY_SURGERY_CENTER): Payer: Self-pay

## 2017-06-23 VITALS — Ht 66.0 in | Wt 173.0 lb

## 2017-06-23 DIAGNOSIS — R197 Diarrhea, unspecified: Secondary | ICD-10-CM

## 2017-06-23 MED ORDER — NA SULFATE-K SULFATE-MG SULF 17.5-3.13-1.6 GM/177ML PO SOLN: 1.0000 | Freq: Once | ORAL | 0 refills | Status: AC

## 2017-06-23 NOTE — Progress Notes (Signed)
Denies allergies to eggs or soy products. Denies complication of anesthesia or sedation. Denies use of weight loss medication. Denies use of O2.   Emmi instructions declined.  

## 2017-06-27 ENCOUNTER — Encounter: Payer: Self-pay | Admitting: Gastroenterology

## 2017-07-04 ENCOUNTER — Encounter: Payer: Medicare Other | Admitting: Gastroenterology

## 2017-07-04 ENCOUNTER — Telehealth: Payer: Self-pay | Admitting: *Deleted

## 2017-07-04 NOTE — Telephone Encounter (Signed)
Received a message from Dr Loletha Carrow this morning that patients daughter had called and reported that her father was able to drink prep last night with results of brown liquid last night.  He woke up early this morning with vomiting, shaking and sharp cramps in his abdomen.  When she arrived at his house he was doubled over in pain.  He continued to have brown liquid BM's this morning but states that he cannot drink 2nd half of prep.  Will discuss with Dr. Fuller Plan about how to proceed.

## 2017-07-04 NOTE — Telephone Encounter (Signed)
Spoke with Dr. Fuller Plan.  We will plan to cancel pts procedure for today.  He wants pt to come in for an office visit to discuss the next step. Spoke with pts daughter and she wishes to call back to schedule office appointment.

## 2017-07-20 ENCOUNTER — Telehealth: Payer: Self-pay | Admitting: Physician Assistant

## 2017-07-20 ENCOUNTER — Ambulatory Visit: Payer: Medicare Other | Admitting: Physician Assistant

## 2017-07-28 ENCOUNTER — Other Ambulatory Visit: Payer: Self-pay | Admitting: Internal Medicine

## 2017-08-02 ENCOUNTER — Telehealth: Payer: Self-pay | Admitting: Physician Assistant

## 2017-08-02 NOTE — Telephone Encounter (Signed)
Patient daughter states patient has a sinus infection and they want to know what antibiotic the pt pcp can prescribe that will not bother pt stomach as much.

## 2017-08-02 NOTE — Telephone Encounter (Signed)
Advised to try keeping to the shortest course possible and to stay with the simple atb's like pcn. May want to use Florastor also.

## 2017-08-08 ENCOUNTER — Encounter: Payer: Self-pay | Admitting: Physician Assistant

## 2017-08-08 ENCOUNTER — Ambulatory Visit: Payer: Medicare Other | Admitting: Physician Assistant

## 2017-08-08 ENCOUNTER — Other Ambulatory Visit (INDEPENDENT_AMBULATORY_CARE_PROVIDER_SITE_OTHER): Payer: Medicare Other

## 2017-08-08 VITALS — BP 162/78 | HR 84 | Ht 66.0 in | Wt 168.2 lb

## 2017-08-08 DIAGNOSIS — Z8619 Personal history of other infectious and parasitic diseases: Secondary | ICD-10-CM

## 2017-08-08 DIAGNOSIS — K580 Irritable bowel syndrome with diarrhea: Secondary | ICD-10-CM

## 2017-08-08 DIAGNOSIS — R197 Diarrhea, unspecified: Secondary | ICD-10-CM | POA: Diagnosis not present

## 2017-08-08 DIAGNOSIS — Z86010 Personal history of colonic polyps: Secondary | ICD-10-CM

## 2017-08-08 LAB — CBC WITH DIFFERENTIAL/PLATELET
Basophils Absolute: 0.1 10*3/uL (ref 0.0–0.1)
Basophils Relative: 0.7 % (ref 0.0–3.0)
EOS ABS: 0.2 10*3/uL (ref 0.0–0.7)
EOS PCT: 3 % (ref 0.0–5.0)
HCT: 39.3 % (ref 39.0–52.0)
Hemoglobin: 13.6 g/dL (ref 13.0–17.0)
LYMPHS ABS: 2.5 10*3/uL (ref 0.7–4.0)
Lymphocytes Relative: 31.8 % (ref 12.0–46.0)
MCHC: 34.6 g/dL (ref 30.0–36.0)
MCV: 100.2 fl — ABNORMAL HIGH (ref 78.0–100.0)
MONO ABS: 0.5 10*3/uL (ref 0.1–1.0)
Monocytes Relative: 6.7 % (ref 3.0–12.0)
NEUTROS PCT: 57.8 % (ref 43.0–77.0)
Neutro Abs: 4.5 10*3/uL (ref 1.4–7.7)
Platelets: 235 10*3/uL (ref 150.0–400.0)
RBC: 3.92 Mil/uL — AB (ref 4.22–5.81)
RDW: 13.1 % (ref 11.5–15.5)
WBC: 7.8 10*3/uL (ref 4.0–10.5)

## 2017-08-08 MED ORDER — DICYCLOMINE HCL 10 MG PO CAPS: ORAL_CAPSULE | ORAL | 3 refills | Status: DC

## 2017-08-08 MED ORDER — VSL#3 PO CAPS: ORAL_CAPSULE | ORAL | 1 refills | Status: DC

## 2017-08-08 NOTE — Patient Instructions (Addendum)
Please go to the basement level to have your labs drawn and stool test.   We sent prescriptons to [facility] [address]  1. VSL #3 capsules  ( you will not need to take the Florastor while taking this).   2. Dicyclomine ( Bentyl ) 10 mg.   Take Imodium as needed.  Follow u p with Amy Esterwood PA on 09-13-2017 at 1:30 PM.

## 2017-08-08 NOTE — Progress Notes (Signed)
Subjective:    Patient ID: [name], male    DOB: 08/03/1946, 71 y.o.   MRN: [id number]  HPI [name] is a pleasant 71 year old white male, known to Dr. Fuller Plan, and myself.  He was most recently seen in October 2018 for follow-up of relapsing C. difficile colitis.  He had been having symptoms over the past several months.  He was seen in August 2018 with improvement in symptoms but not resolution after a 6-week tapering course of vancomycin, he was then treated with Dificid 200 mg twice a day and completed that in early October. Time of his last office visit we did repeat stool for C. difficile, GI pathogen panel and stool for lactoferrin all of which returned negative.  He had completed a second 6-week tapering course of vancomycin in November. He was scheduled for colonoscopy with Dr. Fuller Plan because of the persistence of diarrhea and negative stool for C. difficile.  This was to be done on November 13, however patient was unable to complete the prep, complained of severe abdominal cramping and vomited before he was able to complete half of the prep.  Colonoscopy was canceled, and he was scheduled to come back in today for follow-up. He says in some ways he is a little bit better, the diarrhea seems to come and go.  Some days he does not appear to be having overt diarrhea but is having soft somewhat narrow stools with 3 or 4 bowel movements per day and on bad days may have 6-8 loose stools.  He is not having any pure watery diarrhea.  No fever or chills.  He does have some urgency and cramping postprandially.  He has only been using Bentyl on a as needed basis recently and not regularly. Unfortunately he developed another sinus infection and was started on amoxicillin last week.  He says he only took a couple of doses and had increase in abdominal cramping and diarrhea and stopped it.  He has been doing sinus lavage etc.  He has not had any fever with this. He does not want to have to have another  attempt at colonoscopy.  His last colon was done a year ago November 2017 with 2 polyps removed, one was a tubular adenoma and one was hyperplastic.  Review of Systems Pertinent positive and negative review of systems were noted in the above HPI section.  All other review of systems was otherwise negative.  Outpatient Encounter Medications as of 08/08/2017  Medication Sig  . aspirin 81 MG tablet Take 81 mg by mouth daily.  Marland Kitchen azelastine (OPTIVAR) 0.05 % ophthalmic solution Place 1 drop into both eyes 2 (two) times daily.  Marland Kitchen dicyclomine (BENTYL) 10 MG capsule Take 1 tab 30 min before meals, 3 times daily.  . dorzolamide-timolol (COSOPT) 22.3-6.8 MG/ML ophthalmic solution Place 1 drop into both eyes daily.  . furosemide (LASIX) 40 MG tablet Take 20 mg by mouth every 12 (twelve) hours. Take one and one half tabs every day  . gabapentin (NEURONTIN) 600 MG tablet Take 600 mg by mouth 3 (three) times daily.  . hydrOXYzine (ATARAX/VISTARIL) 25 MG tablet Take 25 mg by mouth as directed.  . latanoprost (XALATAN) 0.005 % ophthalmic solution Place 1 drop into both eyes at bedtime.   Marland Kitchen lisinopril (PRINIVIL,ZESTRIL) 20 MG tablet Take 1 tablet (20 mg total) by mouth daily. Patient needs to call and schedule an appointment for further refills 1st attempt  . metFORMIN (GLUCOPHAGE-XR) 500 MG 24 hr tablet Take 3  tablets (1,500 mg total) by mouth daily with breakfast.  . methocarbamol (ROBAXIN) 750 MG tablet Take 750 mg by mouth 3 (three) times daily.   . metoprolol succinate (TOPROL-XL) 25 MG 24 hr tablet TAKE 1 TABLET (25 MG TOTAL) BY MOUTH DAILY. TAKE WITH OR IMMEDIATELY FOLLOWING A MEAL.  . metoprolol tartrate (LOPRESSOR) 50 MG tablet Take 50 mg by mouth 2 (two) times daily. 50 mg 1/2 daily.  . nitroGLYCERIN (NITROSTAT) 0.4 MG SL tablet PLACE 1 TABLET UNDER THE TONGUE EVERY 5MINUTES AS NEEDED FOR CHEST PAIN X3 DOSES  . ONE TOUCH ULTRA TEST test strip USE 1 TIME DAILY TO CHECK BLOOD SUGAR.  Marland Kitchen Oxycodone HCl  (OXYCONTIN) 60 MG TB12 Take 1 tablet by mouth every 8 (eight) hours.   Marland Kitchen oxyCODONE-acetaminophen (PERCOCET) 10-325 MG tablet Take 1 tablet by mouth every 4 (four) hours.  . potassium chloride SA (K-DUR,KLOR-CON) 20 MEQ tablet Take 20 mEq by mouth once.  . saccharomyces boulardii (FLORASTOR) 250 MG capsule Take 1 capsule (250 mg total) by mouth 2 (two) times daily.  . simvastatin (ZOCOR) 40 MG tablet TAKE 1 TABLET BY MOUTH AT BEDTIME  . VENTOLIN HFA 108 (90 BASE) MCG/ACT inhaler Inhale 2 puffs into the lungs 2 (two) times daily as needed for shortness of breath.   . [DISCONTINUED] dicyclomine (BENTYL) 10 MG capsule Take 1 capsule (10 mg total) by mouth 3 (three) times daily before meals.  Marland Kitchen amLODipine (NORVASC) 5 MG tablet Take 1 tablet (5 mg total) by mouth daily.  . Probiotic Product (VSL#3) CAPS Take 1 capsule twice daily for 6 weeks.  (112.5 billion bacteria per capsule)   Facility-Administered Encounter Medications as of 08/08/2017  Medication  . 0.9 %  sodium chloride infusion   Allergies  Allergen Reactions  . Bupropion Hcl Other (See Comments)    "messes with my head"  . Doxycycline Hyclate Diarrhea       . Tetracycline Diarrhea  . Verapamil Other (See Comments)    "messes with my head"   Patient Active Problem List   Diagnosis Date Noted  . Chest pain 09/15/2014  . HYPERCHOLESTEROLEMIA 10/27/2009  . SMOKER 10/27/2009  . DEPRESSION 10/27/2009  . OSTEOARTHRITIS, LUMBAR SPINE 10/27/2009  . COUGH 10/27/2009  . NUMBNESS 08/28/2008  . ESOPHAGEAL STRICTURE 12/10/2007  . GERD 12/10/2007  . DUODENITIS 12/10/2007  . TRANSAMINASES, SERUM, ELEVATED 12/10/2007  . Diabetes ([place]) 05/02/2007  . Essential hypertension 05/02/2007  . Coronary atherosclerosis 05/02/2007  . ALLERGIC RHINITIS 05/02/2007   Social History   Socioeconomic History  . Marital status: Married    Spouse name: Not on file  . Number of children: 2  . Years of education: Not on file  . Highest education  level: Not on file  Social Needs  . Financial resource strain: Not on file  . Food insecurity - worry: Not on file  . Food insecurity - inability: Not on file  . Transportation needs - medical: Not on file  . Transportation needs - non-medical: Not on file  Occupational History  . Occupation: Disabled    Employer: UNEMPLOYED  Tobacco Use  . Smoking status: Current Every Day Smoker    Packs/day: 1.50    Years: 56.00    Pack years: 84.00    Types: Cigarettes  . Smokeless tobacco: Never Used  . Tobacco comment: tobacco info given 05/23/17  Substance and Sexual Activity  . Alcohol use: No  . Drug use: No  . Sexual activity: Not on file  Other Topics  Concern  . Not on file  Social History Narrative  . Not on file    Mr. Shipley family history includes Cancer in his mother; Diabetes in his brother and sister; Heart disease in his father; Pancreatic cancer in his mother.      Objective:    Vitals:   08/08/17 1400  BP: (!) 162/78  Pulse: 84    Physical Exam well-developed somewhat chronically ill-appearing older white male, accompanied by his daughter, both pleasant blood pressure 162/78 pulse 84, height 5 foot 6, weight 168, BMI 27.1.  HEENT; nontraumatic normocephalic EOMI PERRLA sclera anicteric, Cardiovascular ;regular rate and rhythm with S1-S2 no murmur rub or gallop, Pulmonary; clear bilaterally, Abdomen ;soft, he has some mild bilateral lower quadrant tenderness there is no guarding or rebound no palpable mass or hepatosplenomegaly bowel sounds are present, Rectal; exam not done, Extremities; no clubbing cyanosis or edema skin warm and dry, Neuro psych; mood and affect appropriate, he is sleepy and keeps his head down for much of the visit.       Assessment & Plan:   #32 71 year old white male with recent history of relapsing C. difficile colitis who has completed several rounds of antibiotics, the last finished in early November.  Most recent stool studies done mid  October with negative stool for C. difficile PCR, negative GI path panel and negative stool for lactoferrin. He continues to have some diarrhea, not as bad as it has been and intermittently is having soft to formed stools. I doubt he has persistent C. 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  #2 history of adenomatous colon polyps, up-to-date with colonoscopy last exam November 2017, would be due for follow-up 2022 #3 chronic GERD #4.  Coronary artery disease   #5 hypertension #6.  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 difficile by PCR Patient advised to take Bentyl 10 mg p.o. 1/2-hour before meals on a regular basis rather than as needed, refill sent He may continue Imodium as needed Start trial of VSL #3,112.5 billion 1 twice daily over the next 6 weeks We will plan to follow-up in the office in 4-6 weeks.  I do not think he needs colonoscopy done at this time.  Brantley Wiley S Shalese Strahan PA-C 08/08/2017   Cc: [name], *

## 2017-08-09 NOTE — Progress Notes (Signed)
Reviewed and agree with management plan.  Chayanne Filippi T. Syana Degraffenreid, MD FACG 

## 2017-08-10 ENCOUNTER — Other Ambulatory Visit: Payer: Medicare Other

## 2017-08-10 DIAGNOSIS — Z8619 Personal history of other infectious and parasitic diseases: Secondary | ICD-10-CM

## 2017-08-11 LAB — C. DIFFICILE GDH AND TOXIN A/B
GDH ANTIGEN: NOT DETECTED
MICRO NUMBER:: 81434066
SPECIMEN QUALITY:: ADEQUATE
TOXIN A AND B: NOT DETECTED

## 2017-08-24 ENCOUNTER — Other Ambulatory Visit: Payer: Self-pay | Admitting: Internal Medicine

## 2017-09-10 ENCOUNTER — Other Ambulatory Visit: Payer: Self-pay | Admitting: Internal Medicine

## 2017-09-12 ENCOUNTER — Other Ambulatory Visit: Payer: Self-pay | Admitting: Internal Medicine

## 2017-09-13 ENCOUNTER — Other Ambulatory Visit (INDEPENDENT_AMBULATORY_CARE_PROVIDER_SITE_OTHER): Payer: Medicare Other

## 2017-09-13 ENCOUNTER — Encounter: Payer: Self-pay | Admitting: Physician Assistant

## 2017-09-13 ENCOUNTER — Telehealth: Payer: Self-pay | Admitting: Physician Assistant

## 2017-09-13 ENCOUNTER — Ambulatory Visit: Payer: Medicare Other | Admitting: Physician Assistant

## 2017-09-13 VITALS — BP 140/70 | HR 62 | Ht 62.0 in | Wt 170.0 lb

## 2017-09-13 DIAGNOSIS — R194 Change in bowel habit: Secondary | ICD-10-CM | POA: Diagnosis not present

## 2017-09-13 DIAGNOSIS — Z8619 Personal history of other infectious and parasitic diseases: Secondary | ICD-10-CM

## 2017-09-13 DIAGNOSIS — R143 Flatulence: Secondary | ICD-10-CM

## 2017-09-13 DIAGNOSIS — R1084 Generalized abdominal pain: Secondary | ICD-10-CM | POA: Diagnosis not present

## 2017-09-13 LAB — CBC WITH DIFFERENTIAL/PLATELET
BASOS PCT: 0.6 % (ref 0.0–3.0)
Basophils Absolute: 0 10*3/uL (ref 0.0–0.1)
EOS ABS: 0.2 10*3/uL (ref 0.0–0.7)
EOS PCT: 2.1 % (ref 0.0–5.0)
HCT: 38.8 % — ABNORMAL LOW (ref 39.0–52.0)
HEMOGLOBIN: 13.2 g/dL (ref 13.0–17.0)
LYMPHS ABS: 2 10*3/uL (ref 0.7–4.0)
Lymphocytes Relative: 27.1 % (ref 12.0–46.0)
MCHC: 34 g/dL (ref 30.0–36.0)
MCV: 101.4 fl — ABNORMAL HIGH (ref 78.0–100.0)
MONO ABS: 0.5 10*3/uL (ref 0.1–1.0)
Monocytes Relative: 7.2 % (ref 3.0–12.0)
NEUTROS PCT: 63 % (ref 43.0–77.0)
Neutro Abs: 4.7 10*3/uL (ref 1.4–7.7)
Platelets: 194 10*3/uL (ref 150.0–400.0)
RBC: 3.83 Mil/uL — ABNORMAL LOW (ref 4.22–5.81)
RDW: 13.9 % (ref 11.5–15.5)
WBC: 7.5 10*3/uL (ref 4.0–10.5)

## 2017-09-13 LAB — SEDIMENTATION RATE: Sed Rate: 20 mm/hr (ref 0–20)

## 2017-09-13 MED ORDER — RIFAXIMIN 550 MG PO TABS: ORAL_TABLET | ORAL | 0 refills | Status: DC

## 2017-09-13 NOTE — Patient Instructions (Addendum)
Please go to the basement level to have your labs drawn.  We have sent the following medications to your pharmacy for you to pick up at your convenience:[facility]. 1. Xifaxan 550 mg  Continue VLS#3 Continue Bentyl 10 mg take 3 times daily as needed for cramping and urgency.   You have been scheduled for a CT scan of the abdomen and pelvis at [facility] ([id number]---this is in the same building as [facility]).   You are scheduled on Tuesday 09-19-2017 at 4:00 PM. You should arrive at 3:45 PM to your appointment time for registration. Please follow the written instructions below on the day of your exam:  WARNING: IF YOU ARE ALLERGIC TO IODINE/X-RAY DYE, PLEASE NOTIFY RADIOLOGY IMMEDIATELY AT [phone]! YOU WILL BE GIVEN A 13 HOUR PREMEDICATION PREP.  1) Do not eat anything after 12:00 Noon (4 hours prior to your test) 2) You have been given 2 bottles of oral contrast to drink. The solution may taste               better if refrigerated, but do NOT add ice or any other liquid to this solution. Shake             well before drinking.    Drink 1 bottle of contrast @ 2:00 PM (2 hours prior to your exam)  Drink 1 bottle of contrast @ 3:00 PM (1 hour prior to your exam)  You may take any medications as prescribed with a small amount of water except for the following: Metformin, Glucophage, Glucovance, Avandamet, Riomet, Fortamet, Actoplus Met, Janumet, Glumetza or Metaglip. The above medications must be held the day of the exam AND 48 hours after the exam.  The purpose of you drinking the oral contrast is to aid in the visualization of your intestinal tract. The contrast solution may cause some diarrhea. Before your exam is started, you will be given a small amount of fluid to drink. Depending on your individual set of symptoms, you may also receive an intravenous injection of x-ray contrast/dye. Plan on being at [facility] for 30 minutes or long,  depending on the type of exam you are having performed.  If you have any questions regarding your exam or if you need to reschedule, you may call the CT department at [phone] between the hours of 8:00 am and 5:00 pm, Monday-Friday.  ________________________________________________________________________

## 2017-09-13 NOTE — Progress Notes (Signed)
Subjective:    Patient ID: [name], male    DOB: 10-01-1945, 72 y.o.   MRN: [id number]  HPI Seven is a 72 year old white male, known to Dr. Fuller Plan and myself. He is been seen several times in the past months after he had a prolonged illness with C. difficile colitis/relapsing and required multiple courses of antibiotics this past summer. Over the past few months he has continued to complain of crampy abdominal discomfort, some gas ,bloating and has had persistent problems with loose stools but not overt diarrhea. 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  We repeated C. difficile PCR which was negative. He has been continuing on Bentyl 10 mg 3 times daily, and was started on VSL #3 twice daily at the last office visit. Comes back in today for follow-up. He seems frustrated because he is continuing to have 6-8 bowel movements per day. He describes these as containing mucus and being mushy stools. He has also continued to have some lower abdominal discomfort and cramping at times. No fever or chills. Appetite fair, weight is very stable. He is also having some urinary symptoms now, says that he is having some extreme urinary urgency at times when his bladder is full and also feels that when his bladder is full he has more intestinal discomfort. Last colonoscopy was done in November 2017 showing mild diverticulosis, and 2 small polyps removed which were tubular adenomas.  Review of Systems Pertinent positive and negative review of systems were noted in the above HPI section.  All other review of systems was otherwise negative.  Outpatient Encounter Medications as of 09/13/2017  Medication Sig  . amLODipine (NORVASC) 5 MG tablet Take 1 tablet (5 mg total) by mouth daily. Please keep 2/6 at 12:15 pm appointment for additional refills thanks.  Marland Kitchen aspirin 81 MG tablet Take  81 mg by mouth daily.  Marland Kitchen azelastine (OPTIVAR) 0.05 % ophthalmic solution Place 1 drop into both eyes 2 (two) times daily.  Marland Kitchen dicyclomine (BENTYL) 10 MG capsule Take 1 tab 30 min before meals, 3 times daily.  . dorzolamide-timolol (COSOPT) 22.3-6.8 MG/ML ophthalmic solution Place 1 drop into both eyes daily.  . furosemide (LASIX) 40 MG tablet Take 20 mg by mouth every 12 (twelve) hours. Take one and one half tabs every day  . gabapentin (NEURONTIN) 600 MG tablet Take 600 mg by mouth 3 (three) times daily.  . hydrOXYzine (ATARAX/VISTARIL) 25 MG tablet Take 25 mg by mouth as directed.  . latanoprost (XALATAN) 0.005 % ophthalmic solution Place 1 drop into both eyes at bedtime.   Marland Kitchen lisinopril (PRINIVIL,ZESTRIL) 20 MG tablet Take 1 tablet (20 mg total) by mouth daily. Please keep 2/6 at 12:15 pm appointment for additional refills thanks.  . metFORMIN (GLUCOPHAGE-XR) 500 MG 24 hr tablet Take 3 tablets (1,500 mg total) by mouth daily with breakfast.  . methocarbamol (ROBAXIN) 750 MG tablet Take 750 mg by mouth 3 (three) times daily.   . metoprolol succinate (TOPROL-XL) 25 MG 24 hr tablet TAKE 1 TABLET (25 MG TOTAL) BY MOUTH DAILY. TAKE WITH OR IMMEDIATELY FOLLOWING A MEAL.  . metoprolol tartrate (LOPRESSOR) 50 MG tablet Take 50 mg by mouth 2 (two) times daily. 50 mg 1/2 daily.  . nitroGLYCERIN (NITROSTAT) 0.4 MG SL tablet PLACE 1 TABLET UNDER THE TONGUE EVERY 5MINUTES AS NEEDED FOR CHEST PAIN X3 DOSES  .  ONE TOUCH ULTRA TEST test strip USE 1 TIME DAILY TO CHECK BLOOD SUGAR.  Marland Kitchen Oxycodone HCl (OXYCONTIN) 60 MG TB12 Take 1 tablet by mouth every 8 (eight) hours.   Marland Kitchen oxyCODONE-acetaminophen (PERCOCET) 10-325 MG tablet Take 1 tablet by mouth every 4 (four) hours.  . potassium chloride SA (K-DUR,KLOR-CON) 20 MEQ tablet Take 20 mEq by mouth once.  . Probiotic Product (VSL#3) CAPS Take 1 capsule twice daily for 6 weeks.  (112.5 billion bacteria per capsule)  . rifaximin (XIFAXAN) 550 MG TABS tablet Take 1 tab 3  times daily.  Marland Kitchen saccharomyces boulardii (FLORASTOR) 250 MG capsule Take 1 capsule (250 mg total) by mouth 2 (two) times daily.  . simvastatin (ZOCOR) 40 MG tablet TAKE 1 TABLET BY MOUTH AT BEDTIME  . VENTOLIN HFA 108 (90 BASE) MCG/ACT inhaler Inhale 2 puffs into the lungs 2 (two) times daily as needed for shortness of breath.    Facility-Administered Encounter Medications as of 09/13/2017  Medication  . 0.9 %  sodium chloride infusion   Allergies  Allergen Reactions  . Bupropion Hcl Other (See Comments)    "messes with my head"  . Doxycycline Hyclate Diarrhea       . Tetracycline Diarrhea  . Verapamil Other (See Comments)    "messes with my head"   Patient Active Problem List   Diagnosis Date Noted  . Chest pain 09/15/2014  . HYPERCHOLESTEROLEMIA 10/27/2009  . SMOKER 10/27/2009  . DEPRESSION 10/27/2009  . OSTEOARTHRITIS, LUMBAR SPINE 10/27/2009  . COUGH 10/27/2009  . NUMBNESS 08/28/2008  . ESOPHAGEAL STRICTURE 12/10/2007  . GERD 12/10/2007  . DUODENITIS 12/10/2007  . TRANSAMINASES, SERUM, ELEVATED 12/10/2007  . Diabetes ([place]) 05/02/2007  . Essential hypertension 05/02/2007  . Coronary atherosclerosis 05/02/2007  . ALLERGIC RHINITIS 05/02/2007   Social History   Socioeconomic History  . Marital status: Married    Spouse name: Not on file  . Number of children: 2  . Years of education: Not on file  . Highest education level: Not on file  Social Needs  . Financial resource strain: Not on file  . Food insecurity - worry: Not on file  . Food insecurity - inability: Not on file  . Transportation needs - medical: Not on file  . Transportation needs - non-medical: Not on file  Occupational History  . Occupation: Disabled    Employer: UNEMPLOYED  Tobacco Use  . Smoking status: Current Every Day Smoker    Packs/day: 1.50    Years: 56.00    Pack years: 84.00    Types: Cigarettes  . Smokeless tobacco: Never Used  . Tobacco comment: tobacco info given 05/23/17    Substance and Sexual Activity  . Alcohol use: No  . Drug use: No  . Sexual activity: Not on file  Other Topics Concern  . Not on file  Social History Narrative  . Not on file    Mr. Pember family history includes Cancer in his mother; Diabetes in his brother and sister; Heart disease in his father; Pancreatic cancer in his mother.      Objective:    Vitals:   09/13/17 1332  BP: 140/70  Pulse: 62    Physical Exam well-developed older white male in no acute distress, accompanied by his daughter, pleasant blood pressure 140/70 pulse 62, height 5 foot 2, weight 170, BMI 31.0. Weight is stable. HEENT ;nontraumatic normocephalic EOMI PERRLA sclera anicteric, Cardiovascular; regular rate and rhythm with S1-S2, Pulmonary ;clear bilaterally, Abdomen ;soft, he has some mild rather  generalized abdominal tenderness nonfocal no guarding or rebound no palpable mass or hepatosplenomegaly bowel sounds are hyperactive, he has a diastases in the upper midline, Rectal; exam not done, Extremities; no clubbing cyanosis or edema skin warm and dry, Neuropsych ;mood and affect appropriate       Assessment & Plan:   #24 72 year old white male with history of relapsing C. difficile colitis requiring several courses of antibiotics spring and summer of 2018. He has continued over the past several months to have increased frequency of bowel movements, and lower abdominal discomfort, intermittent cramping bloating gas etc. Repeat stool studies have been negative. He has been managed with antispasmodics and probiotics but continues to have symptoms. Currently having 6-8 mushy bowel movements per day. 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 difficile and multiple courses of antibiotics. With ongoing complaints of lower abdominal pain and now urinary symptoms will rule out other intra-abdominal inflammatory process #2 history of tubular adenomatous colon  polyps-last colonoscopy November 2017 due for 5 year interval follow-up #3 diverticulosis #4 history of depression #5 hypertension #6 GERD #7 coronary artery disease #8 adult-onset diabetes mellitus  Plan; continue Bentyl 10 mg by mouth 3 times a day Continue V SL #3 112.5 Bil twice daily Check CBC and be met today Schedule for CT of the abdomen and pelvis with contrast. We'll give him a course of Xifaxan 550 mg by mouth 3 times a day 14 days Also suggested he can try taking 1 Imodium every morning in addition to above. I offered referral to urology today but he would like to wait for CT results.  Amy S Esterwood PA-C 09/13/2017   Cc: [name], *

## 2017-09-14 LAB — BASIC METABOLIC PANEL
BUN: 10 mg/dL (ref 6–23)
CHLORIDE: 105 meq/L (ref 96–112)
CO2: 29 mEq/L (ref 19–32)
CREATININE: 0.79 mg/dL (ref 0.40–1.50)
Calcium: 8.7 mg/dL (ref 8.4–10.5)
GFR: 103.21 mL/min (ref >60.00)
GLUCOSE: 112 mg/dL — AB (ref 70–99)
POTASSIUM: 4.1 meq/L (ref 3.5–5.1)
Sodium: 139 mEq/L (ref 135–145)

## 2017-09-14 NOTE — Telephone Encounter (Signed)
Called and LM for Shelly at [employer] . I advised we have enough samples to give the patient for 5 days worth. They would not break a bottle of 60 for the 15 tablets I ordered.

## 2017-09-14 NOTE — Telephone Encounter (Signed)
Lm for the patient advising that we have the 5 days of samples he needs at our front desk.  I also left the same message on the cell phone.

## 2017-09-14 NOTE — [facility] Notes (Signed)
Reviewed and agree with management plan.  Laurier Jasperson T. Jourdain Guay, MD FACG 

## 2017-09-17 ENCOUNTER — Other Ambulatory Visit: Payer: Self-pay | Admitting: Internal Medicine

## 2017-09-19 ENCOUNTER — Inpatient Hospital Stay: Admission: RE | Admit: 2017-09-19 | Payer: Medicare Other | Source: Ambulatory Visit

## 2017-09-20 ENCOUNTER — Telehealth: Payer: Self-pay | Admitting: *Deleted

## 2017-09-20 NOTE — Telephone Encounter (Signed)
   [facility] Pre-operative Risk Assessment    Request for surgical clearance:  1. What type of surgery is being performed? Left SI Joint Injection  2. When is this surgery scheduled? 10/03/17  3. What type of clearance is required (medical clearance vs. Pharmacy clearance to hold med vs. Both)? Pharmacy Clearance  4. Are there any medications that need to be held prior to surgery and how long? Yes, Aspirin, 7 days   5. Practice name and name of physician performing surgery? [facility]  6. What is your office phone and fax number? [phone], F#[phone]  7. Anesthesia type (None, local, MAC, general) ? None   [name] 09/20/2017, 11:44 AM  _________________________________________________________________   (provider comments below)

## 2017-09-22 NOTE — Telephone Encounter (Signed)
Called requesting surgeon's office to inform them that patient would need an appt and it is scheduled, however, their office closes at noon on Fridays. Will have Monday's pre-op team call back and let them know.

## 2017-09-22 NOTE — Telephone Encounter (Signed)
   Primary Cardiologist:Paula Harrington Challenger, MD  Chart reviewed as part of pre-operative protocol coverage. Because of [name]'s past medical history and time since last visit, he/she will require a follow-up visit in order to better assess preoperative cardiovascular risk.  Pre-op covering staff: - Please contact requesting surgeon's office via preferred method (i.e, phone, fax) to inform them of need for appointment prior to surgery.  The patient has appointment with [name] next week.   [place], [place]  09/22/2017, 1:49 PM

## 2017-09-25 NOTE — Telephone Encounter (Signed)
Left message for surgeon's office to call back.

## 2017-09-25 NOTE — Telephone Encounter (Signed)
Spoke with patient's MD office Thurmond Butts) and informed them that the patient has an appointment scheduled for 2/6 in regards to holding his aspirin for clearance. They verbalized understanding.

## 2017-09-25 NOTE — Telephone Encounter (Signed)
   Chart reviewed as part of pre-operative protocol coverage.   See notes below.  Since covering staff is out today, will route to Triage team to call surgeon's office to inform them that patient has an appointment this week and clearance including medication instructions will occur at that visit.  [name], PA-C 09/25/2017, 8:59 AM

## 2017-09-26 ENCOUNTER — Inpatient Hospital Stay: Admission: RE | Admit: 2017-09-26 | Payer: Medicare Other | Source: Ambulatory Visit

## 2017-09-27 ENCOUNTER — Encounter: Payer: Self-pay | Admitting: Physician Assistant

## 2017-09-27 ENCOUNTER — Ambulatory Visit: Payer: Medicare Other | Admitting: Physician Assistant

## 2017-09-27 VITALS — BP 160/70 | HR 64 | Ht 64.0 in | Wt 167.1 lb

## 2017-09-27 DIAGNOSIS — I10 Essential (primary) hypertension: Secondary | ICD-10-CM | POA: Diagnosis not present

## 2017-09-27 DIAGNOSIS — I2510 Atherosclerotic heart disease of native coronary artery without angina pectoris: Secondary | ICD-10-CM

## 2017-09-27 DIAGNOSIS — E7800 Pure hypercholesterolemia, unspecified: Secondary | ICD-10-CM | POA: Diagnosis not present

## 2017-09-27 DIAGNOSIS — E1159 Type 2 diabetes mellitus with other circulatory complications: Secondary | ICD-10-CM

## 2017-09-27 DIAGNOSIS — Z01810 Encounter for preprocedural cardiovascular examination: Secondary | ICD-10-CM

## 2017-09-27 MED ORDER — LISINOPRIL 40 MG PO TABS: 40.0000 mg | ORAL_TABLET | Freq: Every day | ORAL | 3 refills | Status: DC

## 2017-09-27 NOTE — Patient Instructions (Signed)
Medication Instructions:  1. MAKE SURE YOU ARE TAKING THE METOPROLOL TWICE DAILY  2. INCREASE LISINOPRIL 40 MG DAILY; NEW RX HAS BEEN SENT IN  Labwork: FASTING LIPID AND CMET TO BE DONE IN 2 WEEKS   Testing/Procedures: NONE ORDERED TODAY  Follow-Up: Your physician wants you to follow-up in: [place] DR. ROSS You will receive a reminder letter in the mail two months in advance. If you don't receive a letter, please call our office to schedule the follow-up appointment.    Any Other Special Instructions Will Be Listed Below (If Applicable).     If you need a refill on your cardiac medications before your next appointment, please call your pharmacy.
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
## 2017-10-06 ENCOUNTER — Inpatient Hospital Stay: Admission: RE | Admit: 2017-10-06 | Payer: Medicare Other | Source: Ambulatory Visit

## 2017-10-11 ENCOUNTER — Other Ambulatory Visit: Payer: Medicare Other | Admitting: *Deleted

## 2017-10-11 DIAGNOSIS — E7800 Pure hypercholesterolemia, unspecified: Secondary | ICD-10-CM

## 2017-10-11 DIAGNOSIS — I10 Essential (primary) hypertension: Secondary | ICD-10-CM

## 2017-10-11 LAB — COMPREHENSIVE METABOLIC PANEL
ALT: 15 IU/L (ref 0–44)
AST: 19 IU/L (ref 0–40)
Albumin/Globulin Ratio: 2 (ref 1.2–2.2)
Albumin: 4.7 g/dL (ref 3.6–4.8)
Alkaline Phosphatase: 84 IU/L (ref 39–117)
BUN/Creatinine Ratio: 13 (ref 10–24)
BUN: 10 mg/dL (ref 8–27)
Bilirubin Total: 0.4 mg/dL (ref 0.0–1.2)
CALCIUM: 9.2 mg/dL (ref 8.6–10.2)
CHLORIDE: 99 mmol/L (ref 96–106)
CO2: 27 mmol/L (ref 20–29)
Creatinine, Ser: 0.75 mg/dL — ABNORMAL LOW (ref 0.76–1.27)
GFR, EST AFRICAN AMERICAN: 108 mL/min/{1.73_m2} (ref >59)
GFR, EST NON AFRICAN AMERICAN: 94 mL/min/{1.73_m2} (ref >59)
GLUCOSE: 111 mg/dL — AB (ref 65–99)
Globulin, Total: 2.3 g/dL (ref 1.5–4.5)
Potassium: 4.1 mmol/L (ref 3.5–5.2)
Sodium: 141 mmol/L (ref 134–144)
TOTAL PROTEIN: 7 g/dL (ref 6.0–8.5)

## 2017-10-11 LAB — LIPID PANEL
CHOL/HDL RATIO: 1.9 ratio (ref 0.0–5.0)
Cholesterol, Total: 149 mg/dL (ref 100–199)
HDL: 77 mg/dL (ref >39)
LDL Calculated: 61 mg/dL (ref 0–99)
Triglycerides: 55 mg/dL (ref 0–149)
VLDL Cholesterol Cal: 11 mg/dL (ref 5–40)

## 2017-10-12 ENCOUNTER — Telehealth: Payer: Self-pay

## 2017-10-12 DIAGNOSIS — E785 Hyperlipidemia, unspecified: Secondary | ICD-10-CM

## 2017-10-12 MED ORDER — ATORVASTATIN CALCIUM 40 MG PO TABS: 40.0000 mg | ORAL_TABLET | Freq: Every day | ORAL | 3 refills | Status: DC

## 2017-10-12 NOTE — Telephone Encounter (Signed)
-----   Message from Liliane Shi, PA-C sent at 10/12/2017  8:34 AM EST ----- Cholesterol at goal.  LDL is less than 70.  Liver enzymes are normal.  Renal function is normal.  Glucose is mildly elevated. Although he has been on simvastatin and amlodipine together for a long time, there is a potential interaction between the 2 drugs. It would be best if we changed his simvastatin to another drug. PLAN:  1.  DC simvastatin 2.  Start atorvastatin 40 mg daily 3.  Obtain lipids and LFTs in 3 months. [name], PA-C    10/12/2017 8:31 AM

## 2017-10-12 NOTE — Telephone Encounter (Signed)
Called and made patient aware of his lab results. Made patient aware that Timothy Dopp, PA would like to stop his simvastatin 40 mg QD since he is on amlodipine as well. Made patient aware that taking amlodipine with the higher dose of simvastatin could potentially increase the serum concentration of the simvastatin, so therefore he will stop simvastatin and start atorvastatin 40 mg QD. Made patient aware that we will recheck fasting labs in 3 months. Patient verbalized understanding and is in agreement with this plan. Rx sent to patient's preferred pharmacy. Fasting LIPIDS and LFTs ordered and lab appointment made for 01/09/18.

## 2017-10-13 ENCOUNTER — Inpatient Hospital Stay: Admission: RE | Admit: 2017-10-13 | Payer: Medicare Other | Source: Ambulatory Visit

## 2017-10-18 ENCOUNTER — Other Ambulatory Visit: Payer: Self-pay | Admitting: Internal Medicine

## 2017-10-25 ENCOUNTER — Inpatient Hospital Stay: Admission: RE | Admit: 2017-10-25 | Payer: Medicare Other | Source: Ambulatory Visit

## 2017-11-09 ENCOUNTER — Other Ambulatory Visit: Payer: Self-pay | Admitting: Internal Medicine

## 2017-11-09 NOTE — Telephone Encounter (Signed)
Outpatient Medication Detail    Disp Refills Start End   lisinopril (PRINIVIL,ZESTRIL) 40 MG tablet 90 tablet 3 09/27/2017 12/26/2017   Sig - Route: Take 1 tablet (40 mg total) by mouth daily. - Oral   Sent to pharmacy as: lisinopril (PRINIVIL,ZESTRIL) 40 MG tablet   E-Prescribing Status: Receipt confirmed by pharmacy (09/27/2017 12:58 PM EST)   Pharmacy   [id number] - , [place].

## 2017-12-09 ENCOUNTER — Other Ambulatory Visit: Payer: Self-pay | Admitting: Physician Assistant

## 2017-12-16 ENCOUNTER — Other Ambulatory Visit: Payer: Self-pay | Admitting: Internal Medicine

## 2017-12-19 NOTE — Telephone Encounter (Signed)
Please call patient and find out what he has been taking. [name], PA-C    12/19/2017 10:25 PM

## 2017-12-19 NOTE — Telephone Encounter (Signed)
Will route to [name], [place] for further recommendations and verification as to which Toprol should be taking. Looks like pt is on Metoprolol Tart 25 mg BID, though pharmacy states they have Rx for Toprol XL. Please advise which Toprol pt should be taking.

## 2017-12-19 NOTE — Telephone Encounter (Signed)
Please advise on which metoprolol patient should be taking. Current med list as well as last office visit has metoprolol tartrate listed but pharmacy is requesting metoprolol succinate. Thanks, MI

## 2017-12-20 NOTE — Telephone Encounter (Signed)
I tried to reach the pt though no answer or answering machine to lmom. I called to verify what Metoprolol her is actually taking. See previous note. Looks like pt should be taking Metoprolol Tartrate 25 mg BID.

## 2017-12-21 NOTE — Telephone Encounter (Signed)
There is a discrepancy between what we have listed and what he is actually taking.  I would continue whatever he is currently taking.  Will need to confirm with patient first.  Then, update chart and refill medication. [name], PA-C    12/21/2017 9:04 AM

## 2017-12-22 NOTE — Telephone Encounter (Signed)
Pt called back and verified that he is taking Metoprolol Succ 25 mg once a day. I will correct medication list in pt's chart and will send in Rx. Pt thanked me for my help.

## 2017-12-22 NOTE — Telephone Encounter (Signed)
I have Lmptcb x 3 to verify which Metoprolol pt is taking in order to fill his prescription correctly.

## 2018-01-04 ENCOUNTER — Other Ambulatory Visit: Payer: Self-pay | Admitting: Physician Assistant

## 2018-01-09 ENCOUNTER — Other Ambulatory Visit: Payer: Medicare Other

## 2018-01-11 ENCOUNTER — Telehealth: Payer: Self-pay | Admitting: *Deleted

## 2018-01-11 NOTE — Telephone Encounter (Signed)
Lm for the patient on his cell number and his home number to call me. I did advise [facility] cannot get the [facility].  [facility] , [address], in the [employer] can get the VSL3 within 24 hours.  They are also to be the cheapest in town for this probiotic.

## 2018-01-30 ENCOUNTER — Inpatient Hospital Stay (HOSPITAL_COMMUNITY)
Admission: EM | Admit: 2018-01-30 | Discharge: 2018-02-05 | DRG: 480 | Disposition: A | Discharge status: Home Health | Payer: Medicare Other | Attending: Family Medicine | Admitting: Family Medicine

## 2018-01-30 DIAGNOSIS — E119 Type 2 diabetes mellitus without complications: Secondary | ICD-10-CM | POA: Diagnosis present

## 2018-01-30 DIAGNOSIS — R140 Abdominal distension (gaseous): Secondary | ICD-10-CM

## 2018-01-30 DIAGNOSIS — G8929 Other chronic pain: Secondary | ICD-10-CM | POA: Diagnosis present

## 2018-01-30 DIAGNOSIS — Z955 Presence of coronary angioplasty implant and graft: Secondary | ICD-10-CM

## 2018-01-30 DIAGNOSIS — W1830XA Fall on same level, unspecified, initial encounter: Secondary | ICD-10-CM | POA: Diagnosis present

## 2018-01-30 DIAGNOSIS — I2510 Atherosclerotic heart disease of native coronary artery without angina pectoris: Secondary | ICD-10-CM | POA: Diagnosis present

## 2018-01-30 DIAGNOSIS — G92 Toxic encephalopathy: Secondary | ICD-10-CM | POA: Diagnosis present

## 2018-01-30 DIAGNOSIS — K5903 Drug induced constipation: Secondary | ICD-10-CM | POA: Diagnosis present

## 2018-01-30 DIAGNOSIS — Z800 Family history of malignant neoplasm of digestive organs: Secondary | ICD-10-CM

## 2018-01-30 DIAGNOSIS — Z6829 Body mass index (BMI) 29.0-29.9, adult: Secondary | ICD-10-CM

## 2018-01-30 DIAGNOSIS — E785 Hyperlipidemia, unspecified: Secondary | ICD-10-CM | POA: Diagnosis present

## 2018-01-30 DIAGNOSIS — W19XXXA Unspecified fall, initial encounter: Secondary | ICD-10-CM

## 2018-01-30 DIAGNOSIS — E663 Overweight: Secondary | ICD-10-CM | POA: Diagnosis present

## 2018-01-30 DIAGNOSIS — I10 Essential (primary) hypertension: Secondary | ICD-10-CM | POA: Diagnosis present

## 2018-01-30 DIAGNOSIS — Z7982 Long term (current) use of aspirin: Secondary | ICD-10-CM

## 2018-01-30 DIAGNOSIS — Y92009 Unspecified place in unspecified non-institutional (private) residence as the place of occurrence of the external cause: Secondary | ICD-10-CM

## 2018-01-30 DIAGNOSIS — F17210 Nicotine dependence, cigarettes, uncomplicated: Secondary | ICD-10-CM | POA: Diagnosis present

## 2018-01-30 DIAGNOSIS — Z79891 Long term (current) use of opiate analgesic: Secondary | ICD-10-CM

## 2018-01-30 DIAGNOSIS — G9340 Encephalopathy, unspecified: Secondary | ICD-10-CM | POA: Diagnosis present

## 2018-01-30 DIAGNOSIS — M79661 Pain in right lower leg: Secondary | ICD-10-CM | POA: Diagnosis not present

## 2018-01-30 DIAGNOSIS — D62 Acute posthemorrhagic anemia: Secondary | ICD-10-CM | POA: Diagnosis not present

## 2018-01-30 DIAGNOSIS — Z419 Encounter for procedure for purposes other than remedying health state, unspecified: Secondary | ICD-10-CM

## 2018-01-30 DIAGNOSIS — F17200 Nicotine dependence, unspecified, uncomplicated: Secondary | ICD-10-CM | POA: Diagnosis present

## 2018-01-30 DIAGNOSIS — T40605A Adverse effect of unspecified narcotics, initial encounter: Secondary | ICD-10-CM | POA: Diagnosis present

## 2018-01-30 DIAGNOSIS — K219 Gastro-esophageal reflux disease without esophagitis: Secondary | ICD-10-CM | POA: Diagnosis present

## 2018-01-30 DIAGNOSIS — K5900 Constipation, unspecified: Secondary | ICD-10-CM | POA: Diagnosis not present

## 2018-01-30 DIAGNOSIS — H409 Unspecified glaucoma: Secondary | ICD-10-CM | POA: Diagnosis present

## 2018-01-30 DIAGNOSIS — S72144A Nondisplaced intertrochanteric fracture of right femur, initial encounter for closed fracture: Secondary | ICD-10-CM

## 2018-01-30 DIAGNOSIS — Y92000 Kitchen of unspecified non-institutional (private) residence as  the place of occurrence of the external cause: Secondary | ICD-10-CM

## 2018-01-30 DIAGNOSIS — I252 Old myocardial infarction: Secondary | ICD-10-CM

## 2018-01-30 DIAGNOSIS — Z8249 Family history of ischemic heart disease and other diseases of the circulatory system: Secondary | ICD-10-CM

## 2018-01-30 DIAGNOSIS — E7800 Pure hypercholesterolemia, unspecified: Secondary | ICD-10-CM | POA: Diagnosis present

## 2018-01-30 DIAGNOSIS — Z833 Family history of diabetes mellitus: Secondary | ICD-10-CM

## 2018-01-30 DIAGNOSIS — Z981 Arthrodesis status: Secondary | ICD-10-CM

## 2018-01-30 NOTE — ED Triage Notes (Signed)
Patient fell at home and was helped up from the floor back to the chair by family. During that time he was unable to move that hip due to an enormous amount of pain. When EMS arrived, that noted no hip deformity or bruising, but spasms were present. He was able to move his hip with pain. He also reports pain in the groin with coughing. During transport, the patient received 200 mcg of fentanyl. Currently on blood thinners.

## 2018-01-30 NOTE — ED Notes (Signed)
Bed: XY80 Expected date:  Expected time:  Means of arrival:  Comments: EMS fall/hip pain

## 2018-01-31 ENCOUNTER — Inpatient Hospital Stay (HOSPITAL_COMMUNITY): Payer: Medicare Other

## 2018-01-31 ENCOUNTER — Emergency Department (HOSPITAL_COMMUNITY): Payer: Medicare Other

## 2018-01-31 ENCOUNTER — Encounter (HOSPITAL_COMMUNITY): Payer: Self-pay

## 2018-01-31 ENCOUNTER — Encounter (HOSPITAL_COMMUNITY)
Admission: EM | Disposition: A | Discharge status: Home Health | Payer: Self-pay | Source: Home / Self Care | Attending: Family Medicine

## 2018-01-31 ENCOUNTER — Inpatient Hospital Stay (HOSPITAL_COMMUNITY): Payer: Medicare Other | Admitting: Certified Registered Nurse Anesthetist

## 2018-01-31 ENCOUNTER — Other Ambulatory Visit: Payer: Self-pay

## 2018-01-31 DIAGNOSIS — Z800 Family history of malignant neoplasm of digestive organs: Secondary | ICD-10-CM | POA: Diagnosis not present

## 2018-01-31 DIAGNOSIS — M79661 Pain in right lower leg: Secondary | ICD-10-CM | POA: Diagnosis not present

## 2018-01-31 DIAGNOSIS — I252 Old myocardial infarction: Secondary | ICD-10-CM | POA: Diagnosis not present

## 2018-01-31 DIAGNOSIS — Z981 Arthrodesis status: Secondary | ICD-10-CM | POA: Diagnosis not present

## 2018-01-31 DIAGNOSIS — S72144A Nondisplaced intertrochanteric fracture of right femur, initial encounter for closed fracture: Secondary | ICD-10-CM | POA: Diagnosis present

## 2018-01-31 DIAGNOSIS — W1830XA Fall on same level, unspecified, initial encounter: Secondary | ICD-10-CM | POA: Diagnosis present

## 2018-01-31 DIAGNOSIS — I2510 Atherosclerotic heart disease of native coronary artery without angina pectoris: Secondary | ICD-10-CM | POA: Diagnosis present

## 2018-01-31 DIAGNOSIS — D62 Acute posthemorrhagic anemia: Secondary | ICD-10-CM | POA: Diagnosis not present

## 2018-01-31 DIAGNOSIS — Z6829 Body mass index (BMI) 29.0-29.9, adult: Secondary | ICD-10-CM | POA: Diagnosis not present

## 2018-01-31 DIAGNOSIS — Z8249 Family history of ischemic heart disease and other diseases of the circulatory system: Secondary | ICD-10-CM | POA: Diagnosis not present

## 2018-01-31 DIAGNOSIS — Z955 Presence of coronary angioplasty implant and graft: Secondary | ICD-10-CM | POA: Diagnosis not present

## 2018-01-31 DIAGNOSIS — Z833 Family history of diabetes mellitus: Secondary | ICD-10-CM | POA: Diagnosis not present

## 2018-01-31 DIAGNOSIS — G8929 Other chronic pain: Secondary | ICD-10-CM | POA: Diagnosis present

## 2018-01-31 DIAGNOSIS — S72001A Fracture of unspecified part of neck of right femur, initial encounter for closed fracture: Secondary | ICD-10-CM | POA: Diagnosis not present

## 2018-01-31 DIAGNOSIS — H409 Unspecified glaucoma: Secondary | ICD-10-CM | POA: Diagnosis present

## 2018-01-31 DIAGNOSIS — I10 Essential (primary) hypertension: Secondary | ICD-10-CM | POA: Diagnosis present

## 2018-01-31 DIAGNOSIS — E663 Overweight: Secondary | ICD-10-CM | POA: Diagnosis present

## 2018-01-31 DIAGNOSIS — K219 Gastro-esophageal reflux disease without esophagitis: Secondary | ICD-10-CM | POA: Diagnosis present

## 2018-01-31 DIAGNOSIS — M79609 Pain in unspecified limb: Secondary | ICD-10-CM | POA: Diagnosis not present

## 2018-01-31 DIAGNOSIS — F17200 Nicotine dependence, unspecified, uncomplicated: Secondary | ICD-10-CM | POA: Diagnosis not present

## 2018-01-31 DIAGNOSIS — G92 Toxic encephalopathy: Secondary | ICD-10-CM | POA: Diagnosis present

## 2018-01-31 DIAGNOSIS — K5903 Drug induced constipation: Secondary | ICD-10-CM | POA: Diagnosis present

## 2018-01-31 DIAGNOSIS — F17210 Nicotine dependence, cigarettes, uncomplicated: Secondary | ICD-10-CM | POA: Diagnosis present

## 2018-01-31 DIAGNOSIS — G9340 Encephalopathy, unspecified: Secondary | ICD-10-CM | POA: Diagnosis present

## 2018-01-31 DIAGNOSIS — E1159 Type 2 diabetes mellitus with other circulatory complications: Secondary | ICD-10-CM | POA: Diagnosis not present

## 2018-01-31 DIAGNOSIS — Z79891 Long term (current) use of opiate analgesic: Secondary | ICD-10-CM | POA: Diagnosis not present

## 2018-01-31 DIAGNOSIS — Y92000 Kitchen of unspecified non-institutional (private) residence as  the place of occurrence of the external cause: Secondary | ICD-10-CM | POA: Diagnosis not present

## 2018-01-31 DIAGNOSIS — T40605A Adverse effect of unspecified narcotics, initial encounter: Secondary | ICD-10-CM | POA: Diagnosis present

## 2018-01-31 DIAGNOSIS — E785 Hyperlipidemia, unspecified: Secondary | ICD-10-CM | POA: Diagnosis present

## 2018-01-31 DIAGNOSIS — E7800 Pure hypercholesterolemia, unspecified: Secondary | ICD-10-CM | POA: Diagnosis not present

## 2018-01-31 DIAGNOSIS — Z7982 Long term (current) use of aspirin: Secondary | ICD-10-CM | POA: Diagnosis not present

## 2018-01-31 DIAGNOSIS — E119 Type 2 diabetes mellitus without complications: Secondary | ICD-10-CM | POA: Diagnosis present

## 2018-01-31 HISTORY — PX: FEMUR IM NAIL: SHX1597

## 2018-01-31 LAB — HIV ANTIBODY (ROUTINE TESTING W REFLEX): HIV SCREEN 4TH GENERATION: NONREACTIVE

## 2018-01-31 LAB — COMPREHENSIVE METABOLIC PANEL
ALBUMIN: 4.6 g/dL (ref 3.5–5.0)
ALT: 23 U/L (ref 17–63)
AST: 29 U/L (ref 15–41)
Alkaline Phosphatase: 101 U/L (ref 38–126)
Anion gap: 11 (ref 5–15)
BUN: 7 mg/dL (ref 6–20)
CHLORIDE: 102 mmol/L (ref 101–111)
CO2: 27 mmol/L (ref 22–32)
CREATININE: 0.67 mg/dL (ref 0.61–1.24)
Calcium: 9.3 mg/dL (ref 8.9–10.3)
GFR calc Af Amer: >60 mL/min (ref >60)
GFR calc non Af Amer: >60 mL/min (ref >60)
GLUCOSE: 166 mg/dL — AB (ref 65–99)
POTASSIUM: 3.5 mmol/L (ref 3.5–5.1)
Sodium: 140 mmol/L (ref 135–145)
Total Bilirubin: 1.1 mg/dL (ref 0.3–1.2)
Total Protein: 8 g/dL (ref 6.5–8.1)

## 2018-01-31 LAB — URINALYSIS, ROUTINE W REFLEX MICROSCOPIC
BACTERIA UA: NONE SEEN
Bilirubin Urine: NEGATIVE
Glucose, UA: NEGATIVE mg/dL
KETONES UR: NEGATIVE mg/dL
LEUKOCYTES UA: NEGATIVE
Nitrite: NEGATIVE
PH: 8 (ref 5.0–8.0)
PROTEIN: NEGATIVE mg/dL
Specific Gravity, Urine: 1.009 (ref 1.005–1.030)

## 2018-01-31 LAB — SAMPLE TO BLOOD BANK

## 2018-01-31 LAB — GLUCOSE, CAPILLARY
GLUCOSE-CAPILLARY: 160 mg/dL — AB (ref 65–99)
GLUCOSE-CAPILLARY: 126 mg/dL — AB (ref 65–99)
GLUCOSE-CAPILLARY: 125 mg/dL — AB (ref 65–99)
GLUCOSE-CAPILLARY: 121 mg/dL — AB (ref 65–99)
Glucose-Capillary: 125 mg/dL — ABNORMAL HIGH (ref 65–99)

## 2018-01-31 LAB — CBC WITH DIFFERENTIAL/PLATELET
BASOS ABS: 0 10*3/uL (ref 0.0–0.1)
BASOS PCT: 0 %
EOS ABS: 0 10*3/uL (ref 0.0–0.7)
Eosinophils Relative: 0 %
HEMATOCRIT: 42.6 % (ref 39.0–52.0)
Hemoglobin: 15 g/dL (ref 13.0–17.0)
Lymphocytes Relative: 14 %
Lymphs Abs: 1.3 10*3/uL (ref 0.7–4.0)
MCH: 35.5 pg — ABNORMAL HIGH (ref 26.0–34.0)
MCHC: 35.2 g/dL (ref 30.0–36.0)
MCV: 100.7 fL — ABNORMAL HIGH (ref 78.0–100.0)
MONO ABS: 0.6 10*3/uL (ref 0.1–1.0)
Monocytes Relative: 6 %
NEUTROS ABS: 7.3 10*3/uL (ref 1.7–7.7)
NEUTROS PCT: 80 %
Platelets: 209 10*3/uL (ref 150–400)
RBC: 4.23 MIL/uL (ref 4.22–5.81)
RDW: 13.7 % (ref 11.5–15.5)
WBC: 9.2 10*3/uL (ref 4.0–10.5)

## 2018-01-31 LAB — PROTIME-INR
INR: 1.07
PROTHROMBIN TIME: 13.9 s (ref 11.4–15.2)

## 2018-01-31 LAB — HEMOGLOBIN A1C
Hgb A1c MFr Bld: 6 % — ABNORMAL HIGH (ref 4.8–5.6)
MEAN PLASMA GLUCOSE: 125.5 mg/dL

## 2018-01-31 LAB — SURGICAL PCR SCREEN
MRSA, PCR: NEGATIVE
Staphylococcus aureus: NEGATIVE

## 2018-01-31 LAB — APTT: aPTT: 30 seconds (ref 24–36)

## 2018-01-31 SURGERY — INSERTION, INTRAMEDULLARY ROD, FEMUR
Anesthesia: Spinal | Site: Hip | Laterality: Right

## 2018-01-31 MED ORDER — MORPHINE SULFATE (PF) 4 MG/ML IV SOLN
4.0000 mg | Freq: Once | INTRAVENOUS | Status: AC
  Administered 2018-01-31: 4 mg via INTRAVENOUS
  Filled 2018-01-31: qty 1

## 2018-01-31 MED ORDER — ACETAMINOPHEN 500 MG PO TABS
1000.0000 mg | ORAL_TABLET | Freq: Four times a day (QID) | ORAL | Status: AC
  Administered 2018-02-01 (×4): 1000 mg via ORAL
  Filled 2018-01-31 (×4): qty 2

## 2018-01-31 MED ORDER — PHENYLEPHRINE 40 MCG/ML (10ML) SYRINGE FOR IV PUSH (FOR BLOOD PRESSURE SUPPORT)
PREFILLED_SYRINGE | INTRAVENOUS | Status: AC
  Filled 2018-01-31: qty 30

## 2018-01-31 MED ORDER — OXYCODONE HCL ER 20 MG PO T12A
60.0000 mg | EXTENDED_RELEASE_TABLET | Freq: Three times a day (TID) | ORAL | Status: DC
  Administered 2018-01-31 – 2018-02-05 (×16): 60 mg via ORAL
  Filled 2018-01-31 (×16): qty 3

## 2018-01-31 MED ORDER — FENTANYL CITRATE (PF) 100 MCG/2ML IJ SOLN
INTRAMUSCULAR | Status: AC
  Administered 2018-01-31: 50 ug via INTRAVENOUS
  Filled 2018-01-31: qty 2

## 2018-01-31 MED ORDER — HYDROMORPHONE HCL 1 MG/ML IJ SOLN
0.2500 mg | INTRAMUSCULAR | Status: DC | PRN
  Administered 2018-01-31 (×2): 0.5 mg via INTRAVENOUS

## 2018-01-31 MED ORDER — PHENOL 1.4 % MT LIQD: 1.0000 | OROMUCOSAL | Status: DC | PRN

## 2018-01-31 MED ORDER — CEFAZOLIN SODIUM-DEXTROSE 2-4 GM/100ML-% IV SOLN
2.0000 g | INTRAVENOUS | Status: AC
  Administered 2018-01-31: 2 g via INTRAVENOUS
  Filled 2018-01-31: qty 100

## 2018-01-31 MED ORDER — HYDROMORPHONE HCL 1 MG/ML IJ SOLN
INTRAMUSCULAR | Status: AC
  Filled 2018-01-31: qty 1

## 2018-01-31 MED ORDER — METHOCARBAMOL 500 MG PO TABS
500.0000 mg | ORAL_TABLET | Freq: Four times a day (QID) | ORAL | Status: DC | PRN
  Administered 2018-02-01 – 2018-02-05 (×11): 500 mg via ORAL
  Filled 2018-01-31 (×13): qty 1

## 2018-01-31 MED ORDER — LIDOCAINE HCL (CARDIAC) PF 100 MG/5ML IV SOSY
PREFILLED_SYRINGE | INTRAVENOUS | Status: DC | PRN
  Administered 2018-01-31: 60 mg via INTRAVENOUS

## 2018-01-31 MED ORDER — ONDANSETRON HCL 4 MG PO TABS: 4.0000 mg | ORAL_TABLET | Freq: Four times a day (QID) | ORAL | Status: DC | PRN

## 2018-01-31 MED ORDER — SODIUM CHLORIDE 0.9 % IV SOLN
INTRAVENOUS | Status: DC
  Administered 2018-01-31 – 2018-02-02 (×3): via INTRAVENOUS

## 2018-01-31 MED ORDER — DEXAMETHASONE SODIUM PHOSPHATE 10 MG/ML IJ SOLN
INTRAMUSCULAR | Status: DC | PRN
  Administered 2018-01-31: 10 mg via INTRAVENOUS

## 2018-01-31 MED ORDER — 0.9 % SODIUM CHLORIDE (POUR BTL) OPTIME
TOPICAL | Status: DC | PRN
  Administered 2018-01-31: 1000 mL

## 2018-01-31 MED ORDER — DOCUSATE SODIUM 100 MG PO CAPS
100.0000 mg | ORAL_CAPSULE | Freq: Two times a day (BID) | ORAL | Status: DC
  Administered 2018-01-31 – 2018-02-04 (×9): 100 mg via ORAL
  Filled 2018-01-31 (×9): qty 1

## 2018-01-31 MED ORDER — ONDANSETRON HCL 4 MG/2ML IJ SOLN: 4.0000 mg | Freq: Once | INTRAMUSCULAR | Status: DC | PRN

## 2018-01-31 MED ORDER — OXYCODONE HCL 5 MG PO TABS
5.0000 mg | ORAL_TABLET | ORAL | Status: DC | PRN
  Administered 2018-02-01: 10 mg via ORAL
  Filled 2018-01-31 (×2): qty 2

## 2018-01-31 MED ORDER — FENTANYL CITRATE (PF) 100 MCG/2ML IJ SOLN
50.0000 ug | INTRAMUSCULAR | Status: AC
Start: 2018-01-31 — End: 2018-01-31
  Administered 2018-01-31: 50 ug via INTRAVENOUS

## 2018-01-31 MED ORDER — PHENYLEPHRINE HCL 10 MG/ML IJ SOLN
INTRAMUSCULAR | Status: AC
  Filled 2018-01-31: qty 5

## 2018-01-31 MED ORDER — MENTHOL 3 MG MT LOZG: 1.0000 | LOZENGE | OROMUCOSAL | Status: DC | PRN

## 2018-01-31 MED ORDER — SODIUM CHLORIDE 0.9 % IV SOLN: INTRAVENOUS | Status: DC

## 2018-01-31 MED ORDER — LACTATED RINGERS IV SOLN
INTRAVENOUS | Status: DC | PRN
  Administered 2018-01-31 (×2): via INTRAVENOUS

## 2018-01-31 MED ORDER — METHOCARBAMOL 1000 MG/10ML IJ SOLN
500.0000 mg | Freq: Four times a day (QID) | INTRAVENOUS | Status: DC | PRN
  Filled 2018-01-31: qty 5

## 2018-01-31 MED ORDER — CEFAZOLIN SODIUM-DEXTROSE 2-4 GM/100ML-% IV SOLN
2.0000 g | Freq: Four times a day (QID) | INTRAVENOUS | Status: AC
  Administered 2018-01-31 – 2018-02-01 (×2): 2 g via INTRAVENOUS
  Filled 2018-01-31 (×2): qty 100

## 2018-01-31 MED ORDER — ONDANSETRON HCL 4 MG/2ML IJ SOLN
INTRAMUSCULAR | Status: DC | PRN
  Administered 2018-01-31: 4 mg via INTRAVENOUS

## 2018-01-31 MED ORDER — LATANOPROST 0.005 % OP SOLN
1.0000 [drp] | Freq: Every day | OPHTHALMIC | Status: DC
  Administered 2018-02-01 – 2018-02-04 (×5): 1 [drp] via OPHTHALMIC
  Filled 2018-01-31: qty 2.5

## 2018-01-31 MED ORDER — MORPHINE SULFATE (PF) 2 MG/ML IV SOLN
2.0000 mg | INTRAVENOUS | Status: DC | PRN
  Administered 2018-01-31: 2 mg via INTRAVENOUS
  Filled 2018-01-31: qty 1

## 2018-01-31 MED ORDER — EPHEDRINE SULFATE-NACL 50-0.9 MG/10ML-% IV SOSY
PREFILLED_SYRINGE | INTRAVENOUS | Status: DC | PRN
Start: 2018-01-31 — End: 2018-01-31
  Administered 2018-01-31: 10 mg via INTRAVENOUS
  Administered 2018-01-31: 5 mg via INTRAVENOUS

## 2018-01-31 MED ORDER — INSULIN ASPART 100 UNIT/ML ~~LOC~~ SOLN
0.0000 [IU] | Freq: Three times a day (TID) | SUBCUTANEOUS | Status: DC
  Administered 2018-02-01: 2 [IU] via SUBCUTANEOUS
  Administered 2018-02-01 – 2018-02-02 (×2): 3 [IU] via SUBCUTANEOUS
  Administered 2018-02-02 (×2): 2 [IU] via SUBCUTANEOUS
  Administered 2018-02-03 – 2018-02-04 (×3): 3 [IU] via SUBCUTANEOUS
  Administered 2018-02-04 – 2018-02-05 (×3): 2 [IU] via SUBCUTANEOUS

## 2018-01-31 MED ORDER — AMLODIPINE BESYLATE 5 MG PO TABS
5.0000 mg | ORAL_TABLET | Freq: Every day | ORAL | Status: DC
Start: 2018-01-31 — End: 2018-02-05
  Administered 2018-01-31 – 2018-02-05 (×6): 5 mg via ORAL
  Filled 2018-01-31 (×6): qty 1

## 2018-01-31 MED ORDER — MUPIROCIN 2 % EX OINT
1.0000 "application " | TOPICAL_OINTMENT | Freq: Two times a day (BID) | CUTANEOUS | Status: AC
  Administered 2018-01-31 – 2018-02-02 (×5): 1 via NASAL
  Filled 2018-01-31: qty 22
  Filled 2018-01-31: qty 44
  Filled 2018-01-31: qty 22

## 2018-01-31 MED ORDER — OXYCODONE HCL 5 MG PO TABS
10.0000 mg | ORAL_TABLET | ORAL | Status: DC | PRN
  Administered 2018-02-01 – 2018-02-02 (×9): 15 mg via ORAL
  Administered 2018-02-03: 10 mg via ORAL
  Administered 2018-02-03 – 2018-02-05 (×6): 15 mg via ORAL
  Administered 2018-02-05 (×2): 10 mg via ORAL
  Filled 2018-01-31 (×2): qty 3
  Filled 2018-01-31: qty 2
  Filled 2018-01-31 (×6): qty 3
  Filled 2018-01-31: qty 2
  Filled 2018-01-31 (×8): qty 3

## 2018-01-31 MED ORDER — PROPOFOL 10 MG/ML IV BOLUS
INTRAVENOUS | Status: AC
  Filled 2018-01-31: qty 40

## 2018-01-31 MED ORDER — FUROSEMIDE 20 MG PO TABS
20.0000 mg | ORAL_TABLET | Freq: Two times a day (BID) | ORAL | Status: DC
Start: 2018-01-31 — End: 2018-02-05
  Administered 2018-01-31 – 2018-02-01 (×3): 20 mg via ORAL
  Administered 2018-02-02: 10 mg via ORAL
  Administered 2018-02-02 – 2018-02-05 (×6): 20 mg via ORAL
  Filled 2018-01-31 (×10): qty 1

## 2018-01-31 MED ORDER — FENTANYL CITRATE (PF) 100 MCG/2ML IJ SOLN: 100.0000 ug | Freq: Once | INTRAMUSCULAR | Status: DC

## 2018-01-31 MED ORDER — HYDROMORPHONE HCL 1 MG/ML IJ SOLN
0.5000 mg | INTRAMUSCULAR | Status: DC | PRN
  Administered 2018-02-01 – 2018-02-05 (×6): 1 mg via INTRAVENOUS
  Filled 2018-01-31 (×6): qty 1

## 2018-01-31 MED ORDER — MORPHINE SULFATE (PF) 2 MG/ML IV SOLN
0.5000 mg | INTRAVENOUS | Status: DC | PRN
  Administered 2018-01-31: 0.5 mg via INTRAVENOUS
  Filled 2018-01-31: qty 1

## 2018-01-31 MED ORDER — PROPOFOL 10 MG/ML IV BOLUS
INTRAVENOUS | Status: DC | PRN
Start: 2018-01-31 — End: 2018-01-31
  Administered 2018-01-31: 40 mg via INTRAVENOUS

## 2018-01-31 MED ORDER — METOCLOPRAMIDE HCL 5 MG/ML IJ SOLN: 5.0000 mg | Freq: Three times a day (TID) | INTRAMUSCULAR | Status: DC | PRN

## 2018-01-31 MED ORDER — KETOTIFEN FUMARATE 0.025 % OP SOLN
1.0000 [drp] | Freq: Two times a day (BID) | OPHTHALMIC | Status: DC
  Administered 2018-02-01 – 2018-02-05 (×9): 1 [drp] via OPHTHALMIC
  Filled 2018-01-31: qty 5

## 2018-01-31 MED ORDER — ONDANSETRON HCL 4 MG/2ML IJ SOLN: 4.0000 mg | Freq: Four times a day (QID) | INTRAMUSCULAR | Status: DC | PRN

## 2018-01-31 MED ORDER — INSULIN ASPART 100 UNIT/ML ~~LOC~~ SOLN
0.0000 [IU] | SUBCUTANEOUS | Status: DC
  Administered 2018-01-31: 2 [IU] via SUBCUTANEOUS
  Administered 2018-01-31 (×3): 1 [IU] via SUBCUTANEOUS
  Administered 2018-02-01: 2 [IU] via SUBCUTANEOUS
  Administered 2018-02-01: 3 [IU] via SUBCUTANEOUS

## 2018-01-31 MED ORDER — ATORVASTATIN CALCIUM 40 MG PO TABS
80.0000 mg | ORAL_TABLET | Freq: Every day | ORAL | Status: DC
  Administered 2018-02-01 – 2018-02-04 (×4): 80 mg via ORAL
  Filled 2018-01-31 (×5): qty 2

## 2018-01-31 MED ORDER — LISINOPRIL 20 MG PO TABS
20.0000 mg | ORAL_TABLET | Freq: Every day | ORAL | Status: DC
  Administered 2018-01-31: 20 mg via ORAL
  Filled 2018-01-31: qty 1

## 2018-01-31 MED ORDER — BUPIVACAINE IN DEXTROSE 0.75-8.25 % IT SOLN
INTRATHECAL | Status: DC | PRN
  Administered 2018-01-31: 2 mL via INTRATHECAL

## 2018-01-31 MED ORDER — PROPOFOL 500 MG/50ML IV EMUL
INTRAVENOUS | Status: DC | PRN
  Administered 2018-01-31: 75 ug/kg/min via INTRAVENOUS

## 2018-01-31 MED ORDER — GABAPENTIN 300 MG PO CAPS
600.0000 mg | ORAL_CAPSULE | Freq: Three times a day (TID) | ORAL | Status: DC
  Administered 2018-01-31 – 2018-02-05 (×16): 600 mg via ORAL
  Filled 2018-01-31 (×17): qty 2

## 2018-01-31 MED ORDER — HYDROMORPHONE HCL 1 MG/ML IJ SOLN
1.0000 mg | INTRAMUSCULAR | Status: DC | PRN
  Administered 2018-01-31: 1 mg via INTRAVENOUS
  Filled 2018-01-31: qty 1

## 2018-01-31 MED ORDER — METOCLOPRAMIDE HCL 5 MG PO TABS: 5.0000 mg | ORAL_TABLET | Freq: Three times a day (TID) | ORAL | Status: DC | PRN

## 2018-01-31 MED ORDER — METOPROLOL TARTRATE 25 MG PO TABS
25.0000 mg | ORAL_TABLET | Freq: Every day | ORAL | Status: DC
  Administered 2018-01-31 – 2018-02-05 (×6): 25 mg via ORAL
  Filled 2018-01-31 (×6): qty 1

## 2018-01-31 MED ORDER — HYDROCODONE-ACETAMINOPHEN 5-325 MG PO TABS
1.0000 | ORAL_TABLET | Freq: Four times a day (QID) | ORAL | Status: DC | PRN
  Administered 2018-01-31: 2 via ORAL
  Filled 2018-01-31: qty 2

## 2018-01-31 MED ORDER — ENOXAPARIN SODIUM 40 MG/0.4ML ~~LOC~~ SOLN
40.0000 mg | SUBCUTANEOUS | Status: DC
  Administered 2018-02-01 – 2018-02-05 (×5): 40 mg via SUBCUTANEOUS
  Filled 2018-01-31 (×5): qty 0.4

## 2018-01-31 MED ORDER — PHENYLEPHRINE 40 MCG/ML (10ML) SYRINGE FOR IV PUSH (FOR BLOOD PRESSURE SUPPORT)
PREFILLED_SYRINGE | INTRAVENOUS | Status: DC | PRN
  Administered 2018-01-31 (×2): 80 ug via INTRAVENOUS

## 2018-01-31 MED ORDER — MEPERIDINE HCL 50 MG/ML IJ SOLN: 6.2500 mg | INTRAMUSCULAR | Status: DC | PRN

## 2018-01-31 SURGICAL SUPPLY — 41 items
BAG ZIPLOCK 12X15 (MISCELLANEOUS) ×3 IMPLANT
BIT DRILL CANN LG 4.3MM (BIT) IMPLANT
BNDG GAUZE ELAST 4 BULKY (GAUZE/BANDAGES/DRESSINGS) ×3 IMPLANT
COVER PERINEAL POST (MISCELLANEOUS) ×3 IMPLANT
COVER SURGICAL LIGHT HANDLE (MISCELLANEOUS) ×3 IMPLANT
DRAPE INCISE IOBAN 66X45 STRL (DRAPES) ×3 IMPLANT
DRILL BIT CANN LG 4.3MM (BIT) ×3
DRSG MEPILEX BORDER 4X4 (GAUZE/BANDAGES/DRESSINGS) ×4 IMPLANT
DRSG MEPILEX BORDER 4X8 (GAUZE/BANDAGES/DRESSINGS) ×3 IMPLANT
DURAPREP 26ML APPLICATOR (WOUND CARE) ×3 IMPLANT
ELECT REM PT RETURN 15FT ADLT (MISCELLANEOUS) ×3 IMPLANT
FACESHIELD WRAPAROUND (MASK) ×6 IMPLANT
FACESHIELD WRAPAROUND OR TEAM (MASK) ×2 IMPLANT
GLOVE BIO SURGEON STRL SZ7.5 (GLOVE) ×3 IMPLANT
GLOVE BIO SURGEON STRL SZ8 (GLOVE) ×6 IMPLANT
GLOVE BIOGEL PI IND STRL 7.0 (GLOVE) ×1 IMPLANT
GLOVE BIOGEL PI IND STRL 8 (GLOVE) ×2 IMPLANT
GLOVE BIOGEL PI INDICATOR 7.0 (GLOVE) ×2
GLOVE BIOGEL PI INDICATOR 8 (GLOVE) ×4
GLOVE SURG SS PI 7.0 STRL IVOR (GLOVE) ×3 IMPLANT
GOWN STRL REUS W/TWL LRG LVL3 (GOWN DISPOSABLE) ×6 IMPLANT
GOWN STRL REUS W/TWL XL LVL3 (GOWN DISPOSABLE) ×3 IMPLANT
GUIDEPIN 3.2X17.5 THRD DISP (PIN) ×2 IMPLANT
KIT BASIN OR (CUSTOM PROCEDURE TRAY) ×3 IMPLANT
MANIFOLD NEPTUNE II (INSTRUMENTS) ×3 IMPLANT
NAIL HIP FRACT 130D 11X180 (Screw) ×2 IMPLANT
NS IRRIG 1000ML POUR BTL (IV SOLUTION) ×3 IMPLANT
PACK TOTAL JOINT WL LF (CUSTOM PROCEDURE TRAY) ×3 IMPLANT
POSITIONER SURGICAL ARM (MISCELLANEOUS) ×3 IMPLANT
SCREW BONE CORTICAL 5.0X38 (Screw) ×2 IMPLANT
SCREW LAG HIP NAIL 10.5X95 (Screw) ×2 IMPLANT
STAPLER VISISTAT 35W (STAPLE) ×2 IMPLANT
SUT VIC AB 0 CT1 27 (SUTURE) ×2
SUT VIC AB 0 CT1 27XBRD ANTBC (SUTURE) ×1 IMPLANT
SUT VIC AB 1 CT1 27 (SUTURE) ×2
SUT VIC AB 1 CT1 27XBRD ANTBC (SUTURE) ×1 IMPLANT
SUT VIC AB 2-0 CT1 27 (SUTURE) ×2
SUT VIC AB 2-0 CT1 TAPERPNT 27 (SUTURE) ×1 IMPLANT
SUT VIC AB 4-0 PS2 18 (SUTURE) ×3 IMPLANT
TOWEL OR 17X26 10 PK STRL BLUE (TOWEL DISPOSABLE) ×3 IMPLANT
TRAY FOLEY MTR SLVR 16FR STAT (SET/KITS/TRAYS/PACK) IMPLANT
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
## 2018-01-31 NOTE — Anesthesia Postprocedure Evaluation (Signed)
Anesthesia Post Note  Patient: Moe Graca Riggsbee  Procedure(s) Performed: INTRAMEDULLARY (IM) NAIL FEMORAL (Right Hip)     Patient location during evaluation: PACU Anesthesia Type: Spinal Level of consciousness: awake and alert Pain management: pain level controlled Vital Signs Assessment: post-procedure vital signs reviewed and stable Respiratory status: spontaneous breathing, nonlabored ventilation, respiratory function stable and patient connected to nasal cannula oxygen Cardiovascular status: blood pressure returned to baseline and stable Postop Assessment: no apparent nausea or vomiting Anesthetic complications: no    Last Vitals:  Vitals:   01/31/18 2115 01/31/18 2130  BP: (!) 152/67 (!) 152/69  Pulse: (!) 57 72  Resp: 16 11  Temp:    SpO2: 100% 100%    Last Pain:  Vitals:   01/31/18 2130  TempSrc:   PainSc: Asleep    LLE Motor Response: Purposeful movement;Responds to commands (01/31/18 2130) LLE Sensation: Decreased (01/31/18 2130) RLE Motor Response: No movement due to regional block;Purposeful movement;Responds to commands (01/31/18 2130) RLE Sensation: Decreased (01/31/18 2130) L Sensory Level: L3-Anterior knee, lower leg (01/31/18 2130) R Sensory Level: L2-Upper inner thigh, upper buttock (01/31/18 2130)  Avry Roedl DAVID

## 2018-01-31 NOTE — Interval H&P Note (Signed)
History and Physical Interval Note:  01/31/2018 6:03 PM  [name]  has presented today for surgery, with the diagnosis of right intertroch nail  The various methods of treatment have been discussed with the patient and family. After consideration of risks, benefits and other options for treatment, the patient has consented to  Procedure(s): INTRAMEDULLARY (IM) NAIL FEMORAL (Right) as a surgical intervention .  The patient's history has been reviewed, patient examined, no change in status, stable for surgery.  I have reviewed the patient's chart and labs.  Questions were answered to the patient's satisfaction.     [name]

## 2018-01-31 NOTE — Anesthesia Preprocedure Evaluation (Signed)
Anesthesia Evaluation  Patient identified by MRN, date of birth, ID band Patient awake    Reviewed: Allergy & Precautions, NPO status , Patient's Chart, lab work & pertinent test results  Airway Mallampati: I  TM Distance: >3 FB Neck ROM: Full    Dental   Pulmonary Current Smoker,    Pulmonary exam normal        Cardiovascular hypertension, + CAD, + Past MI and + Cardiac Stents  Normal cardiovascular exam+ Valvular Problems/Murmurs MR   ECHO shows mild MR   Neuro/Psych Depression    GI/Hepatic   Endo/Other  diabetes, Type 2, Oral Hypoglycemic Agents  Renal/GU      Musculoskeletal   Abdominal   Peds  Hematology   Anesthesia Other Findings   Reproductive/Obstetrics                             Anesthesia Physical Anesthesia Plan  ASA: III  Anesthesia Plan: Spinal   Post-op Pain Management:    Induction: Intravenous  PONV Risk Score and Plan: 0  Airway Management Planned: Simple Face Mask  Additional Equipment:   Intra-op Plan:   Post-operative Plan:   Informed Consent: I have reviewed the patients History and Physical, chart, labs and discussed the procedure including the risks, benefits and alternatives for the proposed anesthesia with the patient or authorized representative who has indicated his/her understanding and acceptance.     Plan Discussed with: CRNA and Surgeon  Anesthesia Plan Comments:         Anesthesia Quick Evaluation
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
## 2018-01-31 NOTE — Clinical Social Work Note (Signed)
Clinical Social Work Assessment  Patient Details  Name: [name] MRN: [id number] Date of Birth: 1945-12-21  Date of referral:  01/31/18               Reason for consult:  Facility Placement                Permission sought to share information with:  Family Supports, Customer service manager, Case Optician, dispensing granted to share information::     Name::        Agency::  SNF   Relationship::  Daughter   Contact Information:     Housing/Transportation Living arrangements for the past 2 months:  Mobile Home Source of Information:  Adult Children Patient Interpreter Needed:  None Criminal Activity/Legal Involvement Pertinent to Current Situation/Hospitalization:  No - Comment as needed Significant Relationships:  Adult Children Lives with:  Self Do you feel safe going back to the place where you live?  Yes Need for family participation in patient care:  Yes (Comment)  Care giving concerns:   Patient has medical history significant for tobacco abuse, CAD status post stent placement, diabetes mellitus who was in his usual state of health when he fell landing on his right side.  Patient had immediate pain and was not able to stand.  He was brought into the emergency room.  Upon evaluation, patient was found to have a nondisplaced intertrochanteric femur fracture.  Social Worker assessment / plan:  CSW met with patient daughter at bedside, explain role and reason for visit-patient in severe pain/sleeping during assessment . Patient daughter reports she patient primary caregiver. The patient lives alone in mobile home and daughter is his neighbor. Patient daughter reports the patient likes to be independent. Prior to his fall he was using a walker to ambulate. She reports that she buys his groceries, helps prepare his meals, and takes him to his doctors appointments. Daughter is agreeable to SNF placement. CSW explain SNF process to patient daughter. CSW will follow up later  with bed offers. Patient daughter is open to any facility that will provide good short rehab for the patient.   CSW will complete FL2.  Patient will need new PASSAR.   Plan: SNF   Employment status:  Retired Nurse, adult PT Recommendations:  Not assessed at this time Information / Referral to community resources:  [place]  Patient/Family's Response to care: Agreeable and Responding well to care.   Patient/Family's Understanding of and Emotional Response to Diagnosis, Current Treatment, and Prognosis:  Patient daughter very involved in patient care. She has a good understanding of patient medical history and current diagnosis and treatment plan at discharge.   Emotional Assessment Appearance:  Appears stated age Attitude/Demeanor/Rapport:    Affect (typically observed):  Unable to Assess Orientation:  Oriented to Self Alcohol / Substance use:  Not Applicable Psych involvement (Current and /or in the community):  No (Comment)  Discharge Needs  Concerns to be addressed:  Discharge Planning Concerns Readmission within the last 30 days:  No Current discharge risk:  Dependent with Mobility Barriers to Discharge:  Continued Medical Work up, [place], [place] 01/31/2018, 12:10 PM

## 2018-01-31 NOTE — Plan of Care (Signed)
Reviewed plan of care with patient and daughter, specifically pain control measures, safety precautions, and importance of notifying staff with any questions or concerns. Pt sleepy, so will need reinforced education, but daughter verbalized understanding of all education.

## 2018-01-31 NOTE — Transfer of Care (Signed)
Immediate Anesthesia Transfer of Care Note  Patient: [name]  Procedure(s) Performed: INTRAMEDULLARY (IM) NAIL FEMORAL (Right Hip)  Patient Location: PACU  Anesthesia Type:Spinal  Level of Consciousness: drowsy  Airway & Oxygen Therapy: Patient Spontanous Breathing and Patient connected to face mask oxygen  Post-op Assessment: Report given to RN, Post -op Vital signs reviewed and stable and Patient moving all extremities  Post vital signs: Reviewed and stable  Last Vitals:  Vitals Value Taken Time  BP    Temp    Pulse 60 01/31/2018  7:50 PM  Resp 16 01/31/2018  7:50 PM  SpO2 100 % 01/31/2018  7:50 PM  Vitals shown include unvalidated device data.  Last Pain:  Vitals:   01/31/18 1827  TempSrc:   PainSc: 5       Patients Stated Pain Goal: 4 (69/50/72 2575)  Complications: No apparent anesthesia complications
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
## 2018-01-31 NOTE — Progress Notes (Signed)
Pt and pt's Daughter were provided with the informed consent for surgery later today along with an informed consent for blood. The pt was under the influence of IV pain medication, so the Daughter who is the pt's POA, authorized to consent for the pt. The pt and pt's Daughter reported no further questions or concerns at this time.

## 2018-01-31 NOTE — Anesthesia Procedure Notes (Signed)
Spinal  Start time: 01/31/2018 6:37 PM End time: 01/31/2018 6:40 PM Staffing Anesthesiologist: Lillia Abed, MD Performed: anesthesiologist  Preanesthetic Checklist Completed: patient identified, surgical consent, pre-op evaluation, timeout performed, IV checked, risks and benefits discussed and monitors and equipment checked Spinal Block Patient position: left lateral decubitus Prep: ChloraPrep Patient monitoring: heart rate, cardiac monitor, continuous pulse ox and blood pressure Approach: left paramedian Location: L3-4 Injection technique: single-shot Needle Needle type: Pencan  Needle gauge: 24 G Needle length: 9 cm Needle insertion depth: 6 cm

## 2018-01-31 NOTE — ED Notes (Signed)
ED TO INPATIENT HANDOFF REPORT  Name/Age/Gender [name] 72 y.o. male  Code Status    Code Status Orders  (From admission, onward)        Start     Ordered   01/31/18 0235  Full code  Continuous     01/31/18 0238    Code Status History    Date Active Date Inactive Code Status Order ID Comments User Context   05/22/2013 1247 05/22/2013 2210 Full Code [id number]  Melina Schools, MD Inpatient      Home/SNF/Other Home  Chief Complaint Fall  Level of Care/Admitting Diagnosis ED Disposition    ED Disposition Condition [place]: [facility] [[id number]]  Level of Care: Med-Surg [16]  Diagnosis: Closed right hip fracture, initial encounter [place]) [[id number]]  Admitting Physician: Doreatha Massed  Attending Physician: Etta Quill [phone]  Estimated length of stay: past midnight tomorrow  Certification:: I certify this patient will need inpatient services for at least 2 midnights  PT Class (Do Not Modify): Inpatient [101]  PT Acc Code (Do Not Modify): Private [1]       Medical History Past Medical History:  Diagnosis Date  . ALLERGIC RHINITIS 05/02/2007  . C. difficile diarrhea   . CAD (coronary artery disease)    s/p DES to LAD and LCX in 2005 // [place] in 2009 - stents patent // nuc stress test 7/17 - no ischemia, EF 56 // rare anginal pain  . Cataract   . DEPRESSION 10/27/2009  . DIABETES MELLITUS, TYPE II 05/02/2007  . DUODENITIS 12/10/2007  . ESOPHAGEAL STRICTURE 12/10/2007  . Gall stones   . GERD 12/10/2007  . Glaucoma   . H/O hiatal hernia   . Headache(784.0)    migraines years ago  . Heart murmur    Echo 7/17: EF 55-60, normal diast fxn // Echo 1/16: mild LVH, EF 55-60, mild MR, mild LAE  . HYPERCHOLESTEROLEMIA 10/27/2009  . HYPERTENSION 05/02/2007  . Insomnia   . Myocardial infarction ([place]) 2005  . Neuromuscular disorder ([place])   . OSTEOARTHRITIS, LUMBAR SPINE 10/27/2009  . Pneumonia   . TRANSAMINASES, SERUM,  ELEVATED 12/10/2007    Allergies Allergies  Allergen Reactions  . Bupropion Hcl Other (See Comments)    "messes with my head"  . Doxycycline Hyclate Diarrhea       . Tetracycline Diarrhea  . Verapamil Other (See Comments)    "messes with my head"    IV Location/Drains/Wounds Patient Lines/Drains/Airways Status   Active Line/Drains/Airways    Name:   Placement date:   Placement time:   Site:   Days:   Peripheral IV 01/30/18 Left Antecubital   01/30/18    2324    Antecubital   1   Incision 05/22/13 Neck   05/22/13    1043     1715          Labs/Imaging Results for orders placed or performed during the hospital encounter of 01/30/18 (from the past 48 hour(s))  CBC with Differential     Status: Abnormal   Collection Time: 01/31/18  1:05 AM  Result Value Ref Range   WBC 9.2 4.0 - 10.5 K/uL   RBC 4.23 4.22 - 5.81 MIL/uL   Hemoglobin 15.0 13.0 - 17.0 g/dL   HCT 42.6 39.0 - 52.0 %   MCV 100.7 (H) 78.0 - 100.0 fL   MCH 35.5 (H) 26.0 - 34.0 pg   MCHC 35.2 30.0 - 36.0 g/dL  RDW 13.7 11.5 - 15.5 %   Platelets 209 150 - 400 K/uL   Neutrophils Relative % 80 %   Neutro Abs 7.3 1.7 - 7.7 K/uL   Lymphocytes Relative 14 %   Lymphs Abs 1.3 0.7 - 4.0 K/uL   Monocytes Relative 6 %   Monocytes Absolute 0.6 0.1 - 1.0 K/uL   Eosinophils Relative 0 %   Eosinophils Absolute 0.0 0.0 - 0.7 K/uL   Basophils Relative 0 %   Basophils Absolute 0.0 0.0 - 0.1 K/uL    Comment: Performed at [facility], [place] [address], [place], [place] [address]  Protime-INR     Status: None   Collection Time: 01/31/18  1:05 AM  Result Value Ref Range   Prothrombin Time 13.9 11.4 - 15.2 seconds   INR 1.07     Comment: Performed at [facility], [place] [address] [place], [place] [address]  APTT     Status: None   Collection Time: 01/31/18  1:05 AM  Result Value Ref Range   aPTT 30 24 - 36 seconds    Comment: Performed at [facility], [place] [address] [place], [place] [address]  Sample to Blood Bank     Status: None   Collection Time: 01/31/18  1:05 AM  Result Value Ref Range   Blood Bank Specimen SAMPLE AVAILABLE FOR TESTING    Sample Expiration      02/03/2018 Performed at [facility], [place] [address] [place], [place] [address]    Dg Chest Port 1 View  Result Date: 01/31/2018 CLINICAL DATA:  Hip fracture, preop. EXAM: PORTABLE CHEST 1 VIEW COMPARISON:  Radiographs 07/01/2013 FINDINGS: The cardiomediastinal contours are normal. Heart size upper normal. Pulmonary vasculature is normal. No consolidation, large pleural effusion, or pneumothorax. No acute osseous abnormalities are seen. Bones are under mineralized. IMPRESSION: Low lung volumes without acute abnormality. Electronically Signed   By: Jeb Levering M.D.   On: 01/31/2018 01:42   Dg Hip Unilat  With Pelvis 2-3 Views Right  Result Date: 01/31/2018 CLINICAL DATA:  Pain after fall. EXAM: DG HIP (WITH OR WITHOUT PELVIS) 2-3V RIGHT COMPARISON:  None. FINDINGS: Acute intertrochanteric fracture of the right femur is noted without significant displacement or angulation. The bony pelvis is maintained without diastasis. The pubic rami appear intact as are the acetabula. The contralateral left hip is maintained. There does appear to be an old remote fracture deformity noted of the left inferior pubic ramus. IMPRESSION: 1. Acute intertrochanteric fracture of the right femur without significant displacement or angulation. 2. Remote appearing left inferior pubic ramus fracture. Electronically Signed   By: Ashley Royalty M.D.   On: 01/31/2018 00:54    Pending Labs Unresulted Labs (From admission, onward)   Start     Ordered   01/31/18 0235  HIV antibody (Routine Testing)  Once,   R     01/31/18 0238   01/31/18 0155  Comprehensive metabolic panel  Once,   R     01/31/18 0155   01/31/18 0105  Urinalysis, Routine w reflex microscopic  STAT,   STAT     01/31/18 0105       Vitals/Pain Today's Vitals   01/31/18 0127 01/31/18 0230 01/31/18 0253 01/31/18 0315  BP: (!) 189/82 (!) 184/78  (!) 163/92  Pulse: 86   98  Resp: 14 (!) 22  (!) 24  SpO2: 96%   98%  PainSc:   10-Worst pain ever  Isolation Precautions No active isolations  Medications Medications  HYDROcodone-acetaminophen (NORCO/VICODIN) 5-325 MG per tablet 1-2 tablet (2 tablets Oral Given 01/31/18 0313)  morphine 2 MG/ML injection 0.5 mg (0.5 mg Intravenous Given 01/31/18 0313)  morphine 4 MG/ML injection 4 mg (4 mg Intravenous Given 01/31/18 0129)    Mobility Walks but not currently due to injury

## 2018-01-31 NOTE — Progress Notes (Signed)
Nutrition Brief Note  Patient identified for consult per Hip Fracture Protocol.   Wt Readings from Last 15 Encounters:  01/31/18 170 lb (77.1 kg)  09/27/17 167 lb 1.9 oz (75.8 kg)  09/13/17 170 lb (77.1 kg)  08/08/17 168 lb 3.2 oz (76.3 kg)  06/23/17 173 lb (78.5 kg)  05/23/17 172 lb (78 kg)  04/20/17 170 lb (77.1 kg)  03/23/17 171 lb (77.6 kg)  02/15/17 169 lb (76.7 kg)  09/02/16 176 lb 12.8 oz (80.2 kg)  08/01/16 176 lb (79.8 kg)  07/13/16 177 lb (80.3 kg)  07/08/16 177 lb 6 oz (80.5 kg)  03/14/16 175 lb 12.8 oz (79.7 kg)  03/09/16 178 lb (80.7 kg)    Body mass index is 29.18 kg/m. Patient meets criteria for overweight based on current BMI. Skin WDL. Patient experienced a fall in the kitchen on the day of admission. He was not using his cane or walker to ambulate when the fall occurred; he requires these devices for assistance when ambulating. Patient admitted with R femur fx with plan for surgical fixation.  Current diet order is NPO. Medications reviewed; 20 mg oral Lasix BID, sliding scale Novolog. Labs reviewed; CBGs: 160 and 126 mg/dL today. IVF: NS @ 75 mL/hr.   No nutrition interventions warranted at this time. If nutrition issues arise, please consult RD.     Jarome Matin, MS, RD, LDN, [facility] Inpatient Clinical Dietitian Pager # [phone] After hours/weekend pager # [phone]

## 2018-01-31 NOTE — Op Note (Signed)
  OPERATIVE REPORT   PREOPERATIVE DIAGNOSIS: Right intertrochanteric femur fracture.   POSTOP DIAGNOSIS: Right intertrochanteric femur fracture.   PROCEDURE: Intramedullary nailing, Right intertrochanteric femur  fracture.   SURGEON: Gaynelle Arabian, M.D.   ASSISTANT: Steffanie Dunn Edmisten, PA-C  ANESTHESIA:Spinal  Estimated BLOOD LOSS: 25 ml  DRAINS: None.   COMPLICATIONS:   None  CONDITION: -PACU - hemodynamically stable.    CLINICAL NOTE: [name] is an 72 y.o. male, who had a fall yesterday sustaining a non-displaced  Right intertrochanteric femur fracture. They have been cleared medically and present for operative fixation   PROCEDURE IN DETAIL: After successful administration of  Spinal,  the patient was placed on the fracture table with Right lower extremity in a well-padded traction boot,  Left lower extremity in a well-padded leg holder. Under fluoroscopic guidance, the fracture wasreduced. The traction was locked in this position. Thigh was prepped  and draped in the usual sterile fashion. The guide pin for the Biomet  Affixus was then passed percutaneously to the tip of the greater  trochanter, and then entered into the femoral canal. It was passed into the  canal. The small incision was made and the starter reamer passed over  the guide pin. This was then removed. The nail which was an 11  mm  diameter short trochanteric nail with 130 degrees angle was attached to  the external guide and then passed into the femoral canal, impacted to  the appropriate depth in the canal, then we used the external guide to  place the lag screw. Through the external guide, a guide pin was  passed. Small incision made, and the guide pin was in the center of the  femoral head on the AP and slightly center to posterior on the lateral.  Length was 95 mm. Triple reamer was passed over the guide pin. 95 mm  lag screw was placed. It was then locked down with a locking screw.  Through the  external guide, the distal interlock was placed through the  static hole and this was 38 mm in length with excellent bicortical  purchase. The external guide was then removed. Hardware was in good  position and fracture was well reduced. Wound was copiously irrigated with saline  solution, and  closed deep with interrupted 1 Vicryl, subcu  interrupted 2-0 Vicryl, and skin with staples. Incision was  cleaned and dried and sterile dressings applied. The patient was awakened and  transported to recovery in stable condition.   Dione Plover Clark Clowdus, MD    01/31/2018, 7:38 PM

## 2018-02-01 ENCOUNTER — Encounter (HOSPITAL_COMMUNITY): Payer: Self-pay | Admitting: Orthopedic Surgery

## 2018-02-01 DIAGNOSIS — E1159 Type 2 diabetes mellitus with other circulatory complications: Secondary | ICD-10-CM

## 2018-02-01 DIAGNOSIS — E663 Overweight: Secondary | ICD-10-CM

## 2018-02-01 DIAGNOSIS — D62 Acute posthemorrhagic anemia: Secondary | ICD-10-CM | POA: Diagnosis not present

## 2018-02-01 LAB — GLUCOSE, CAPILLARY
GLUCOSE-CAPILLARY: 144 mg/dL — AB (ref 65–99)
GLUCOSE-CAPILLARY: 85 mg/dL (ref 65–99)
Glucose-Capillary: 132 mg/dL — ABNORMAL HIGH (ref 65–99)
Glucose-Capillary: 165 mg/dL — ABNORMAL HIGH (ref 65–99)
Glucose-Capillary: 173 mg/dL — ABNORMAL HIGH (ref 65–99)
Glucose-Capillary: 206 mg/dL — ABNORMAL HIGH (ref 65–99)

## 2018-02-01 LAB — BASIC METABOLIC PANEL
ANION GAP: 9 (ref 5–15)
BUN: 14 mg/dL (ref 6–20)
CHLORIDE: 102 mmol/L (ref 101–111)
CO2: 26 mmol/L (ref 22–32)
Calcium: 8.3 mg/dL — ABNORMAL LOW (ref 8.9–10.3)
Creatinine, Ser: 0.69 mg/dL (ref 0.61–1.24)
GFR calc Af Amer: >60 mL/min (ref >60)
GLUCOSE: 159 mg/dL — AB (ref 65–99)
Potassium: 3.7 mmol/L (ref 3.5–5.1)
Sodium: 137 mmol/L (ref 135–145)

## 2018-02-01 LAB — CBC
HCT: 35.3 % — ABNORMAL LOW (ref 39.0–52.0)
HEMOGLOBIN: 12.1 g/dL — AB (ref 13.0–17.0)
MCH: 34.7 pg — AB (ref 26.0–34.0)
MCHC: 34.3 g/dL (ref 30.0–36.0)
MCV: 101.1 fL — AB (ref 78.0–100.0)
Platelets: 145 10*3/uL — ABNORMAL LOW (ref 150–400)
RBC: 3.49 MIL/uL — AB (ref 4.22–5.81)
RDW: 13.1 % (ref 11.5–15.5)
WBC: 8.2 10*3/uL (ref 4.0–10.5)

## 2018-02-01 MED ORDER — NICOTINE 14 MG/24HR TD PT24: 14.0000 mg | MEDICATED_PATCH | Freq: Every day | TRANSDERMAL | Status: DC

## 2018-02-01 MED ORDER — NICOTINE 21 MG/24HR TD PT24
21.0000 mg | MEDICATED_PATCH | Freq: Every day | TRANSDERMAL | Status: DC
  Administered 2018-02-01 – 2018-02-05 (×5): 21 mg via TRANSDERMAL
  Filled 2018-02-01 (×5): qty 1

## 2018-02-01 NOTE — Evaluation (Signed)
Occupational Therapy Evaluation Patient Details Name: [name] MRN: [id number] DOB: [birth year] Today's Date: 02/01/2018    History of Present Illness [name] is an 72 y.o. male with a PMH of tobacco abuse, CAD status post stent, and diabetes last 11/19 status post fall resulting in a nondisplaced intertrochanteric femur fracture.  He subsequently underwent IM nail to the right femur 01/31/2018.   Clinical Impression   Pt currently requires +2 mod assist for functional transfer to recliner with walker and +2 total assist with standing self care such as clothing management and hygiene due to not being able to let go of walker (needs walker for support in standing). Pt with 8/10 pain during activity in R LE. Feel pt will benefit from SNF at d/c to improve independence and safety for return home. Will benefit from continued OT while on acute to progress ADLs.     Follow Up Recommendations  SNF;Supervision/Assistance - 24 hour    Equipment Recommendations  Other (comment)(defer to next venue)    Recommendations for Other Services       Precautions / Restrictions Precautions Precautions: Fall Restrictions Weight Bearing Restrictions: No RLE Weight Bearing: Weight bearing as tolerated      Mobility Bed Mobility Overal bed mobility: Needs Assistance Bed Mobility: Supine to Sit     Supine to sit: +2 for safety/equipment;+2 for physical assistance;Mod assist     General bed mobility comments: Use of pillow to support R LE over to side of bed with continued support of R LE until LE touching the floor. Assist with use of pad to scoot hips to EOB. Cues for how to self assist.   Transfers Overall transfer level: Needs assistance Equipment used: Rolling walker (2 wheeled) Transfers: Sit to/from Stand Sit to Stand: +2 safety/equipment;+2 physical assistance;Mod assist              Balance Overall balance assessment: Needs assistance   Sitting balance-Leahy Scale:  Fair     Standing balance support: Bilateral upper extremity supported Standing balance-Leahy Scale: Poor                             ADL either performed or assessed with clinical judgement   ADL Overall ADL's : Needs assistance/impaired Eating/Feeding: Independent;Sitting   Grooming: Set up;Sitting   Upper Body Bathing: Set up;Supervision/ safety;Sitting   Lower Body Bathing: +2 for safety/equipment;+2 for physical assistance;Maximal assistance;Sit to/from stand   Upper Body Dressing : Minimal assistance;Sitting   Lower Body Dressing: +2 for physical assistance;+2 for safety/equipment;Total assistance;Sit to/from stand   Toilet Transfer: +2 for physical assistance;+2 for safety/equipment;Moderate assistance;Stand-pivot;RW   Toileting- Clothing Manipulation and Hygiene: +2 for physical assistance;+2 for safety/equipment;Total assistance;Sit to/from stand         General ADL Comments: Pt reports pain at 7/10 at start of session at rest and up to 8/10 with activity. Pt is motivated to participate in therapy. Pt currently unable to free hands from walker when in standing to manage clothing or hygiene due to need to hold walker for support. Pt tolerated sitting at EOB with R knee bent for several minutes today.      Vision Patient Visual Report: No change from baseline       Perception     Praxis      Pertinent Vitals/Pain Pain Assessment: 0-10 Pain Score: 8  Pain Location: R LE Pain Descriptors / Indicators: Guarding;Grimacing;Aching;Sharp Pain Intervention(s): Monitored during session;Limited activity within  patient's tolerance;Ice applied     Hand Dominance     Extremity/Trunk Assessment Upper Extremity Assessment Upper Extremity Assessment: Overall WFL for tasks assessed           Communication Communication Communication: No difficulties   Cognition Arousal/Alertness: Awake/alert Behavior During Therapy: WFL for tasks  assessed/performed Overall Cognitive Status: Within Functional Limits for tasks assessed                                     General Comments       Exercises     Shoulder Instructions      Home Living Family/patient expects to be discharged to:: Private residence Living Arrangements: Other relatives   Type of Home: Mobile home Home Access: Stairs to enter Entrance Stairs-Number of Steps: 4 Entrance Stairs-Rails: Right;Left Home Layout: One level     Bathroom Shower/Tub: Teacher, early years/pre: Standard     Home Equipment: Environmental consultant - 2 wheels;Cane - quad   Additional Comments: pt states he was in the process of trying to get a chair for the tub to shower on. He states he has to be very careful with LE care due to his diabetes.       Prior Functioning/Environment Level of Independence: Needs assistance  Gait / Transfers Assistance Needed: uses walker for ambulation ADL's / Homemaking Assistance Needed: per chart, daughter assists with groceries and meals.             OT Problem List: Decreased strength;Decreased knowledge of use of DME or AE;Pain      OT Treatment/Interventions: Self-care/ADL training;DME and/or AE instruction;Therapeutic activities;Patient/family education    OT Goals(Current goals can be found in the care plan section) Acute Rehab OT Goals Patient Stated Goal: to return to independence  OT Goal Formulation: With patient Time For Goal Achievement: 02/08/18 Potential to Achieve Goals: Good  OT Frequency: Min 2X/week   Barriers to D/C:            Co-evaluation PT/OT/SLP Co-Evaluation/Treatment: Yes Reason for Co-Treatment: For patient/therapist safety   OT goals addressed during session: ADL's and self-care;Proper use of Adaptive equipment and DME      AM-PAC PT "6 Clicks" Daily Activity     Outcome Measure Help from another person eating meals?: None Help from another person taking care of personal grooming?:  A Little Help from another person toileting, which includes using toliet, bedpan, or urinal?: A Lot Help from another person bathing (including washing, rinsing, drying)?: A Lot Help from another person to put on and taking off regular upper body clothing?: A Little Help from another person to put on and taking off regular lower body clothing?: Total 6 Click Score: 15   End of Session Equipment Utilized During Treatment: Gait belt;Rolling walker  Activity Tolerance: Patient limited by pain Patient left: in chair;with call bell/phone within reach;with family/visitor present  OT Visit Diagnosis: Unsteadiness on feet (R26.81);Muscle weakness (generalized) (M62.81);Pain Pain - Right/Left: Right Pain - part of body: Leg                Time: 1135-1210 OT Time Calculation (min): 35 min Charges:  OT General Charges $OT Visit: 1 Visit OT Evaluation $OT Eval Low Complexity: 1 Low G-Codes:       Philippa Chester 02/01/2018, 12:48 PM

## 2018-02-01 NOTE — Progress Notes (Signed)
Progress Note    [name]  WNU:[id number] DOB: 10-16-1945  DOA: 01/30/2018 PCP: [name], MD    Brief Narrative:   Chief complaint: F/U femur fracture  Medical records reviewed and are as summarized below:  [name] is an 72 y.o. male with a PMH of tobacco abuse, CAD status post stent, and diabetes last 11/19 status post fall resulting in a nondisplaced intertrochanteric femur fracture.  He subsequently underwent IM nail to the right femur 01/31/2018.  Assessment/Plan:   Principal Problem:   Nondisplaced intertrochanteric fracture of right femur, initial encounter for closed fracture (HCC) Status post IM nail right femur 01/31/2018.  PT/OT. Doing well post-operatively. WBAT per ortho. No complaints.   Active Problems:   Acute blood loss anemia Monitor hemoglobin.  No current indication for transfusion.  3 g drop in hemoglobin noted postoperatively.    Diabetes ([place]) Change SSI to before meals only.  CBGs 121-206.    HYPERCHOLESTEROLEMIA Continue Lipitor.    SMOKER Start nicotine patch, 21 mg transdermally daily. Patient reports he smokes 2 PPD, and has never tried nicotine replacement or other methods to help him quit.  He is not interested in smoking at this time.  Counseled.    Essential hypertension Pressure currently controlled on Norvasc, Lasix lopressor.    CAD (coronary artery disease) Continue statin, beta-blocker.    Glaucoma Continue Xalatan.    Acute encephalopathy Likely related to pain medications, as the patient is on fairly high doses due to his history of chronic pain.  Monitor closely.    Overweight (BMI 25.0-29.9) Body mass index is 29.18 kg/m.   Family Communication/Anticipated D/C date and plan/Code Status   DVT prophylaxis: Lovenox ordered. Code Status: Full Code.  Family Communication: No family currently at the bedside. Disposition Plan: Home versus SNF depending on PT recommendations.   Medical  Consultants:    Orthopedic Surgery   Anti-Infectives:    None  Subjective:   Reports some right hip pain.  Appetite good. No BM in the past few days but reports that he typically does not suffer with constipation despite chronic opiate use.  Objective:    Vitals:   01/31/18 2311 02/01/18 0015 02/01/18 0100 02/01/18 0544  BP: (!) 130/59 (!) 145/73 139/68 114/62  Pulse: 70 80 79 69  Resp: 17 16 17 17   Temp:  98 F (36.7 C) 98 F (36.7 C) 98.5 F (36.9 C)  TempSrc:  Oral Oral Oral  SpO2: 99% 99% 98% 99%  Weight:      Height:        Intake/Output Summary (Last 24 hours) at 02/01/2018 0744 Last data filed at 02/01/2018 0653 Gross per 24 hour  Intake 2445 ml  Output 2800 ml  Net -355 ml   Filed Weights   01/31/18 0415 01/31/18 1738  Weight: 77.1 kg (170 lb) 77.1 kg (170 lb)    Exam: General: No acute distress. Cardiovascular: Heart sounds show a regular rate, and rhythm. No gallops or rubs. No murmurs. No JVD. Lungs: Clear to auscultation bilaterally with good air movement. No rales, rhonchi or wheezes. Abdomen: Soft, nontender, nondistended with normal active bowel sounds. No masses. No hepatosplenomegaly. Neurological: Alert and oriented 3. Moves all extremities 4 with equal strength. Cranial nerves II through XII grossly intact. Skin: Warm and dry. No rashes or lesions. Extremities: No clubbing or cyanosis. No edema. Pedal pulses 2+. Psychiatric: Mood and affect are normal. Insight and judgment are fair.   Data Reviewed:  I have personally reviewed following labs and imaging studies:  Labs: Labs show the following:   Basic Metabolic Panel: Recent Labs  Lab 01/31/18 0239 02/01/18 0536  NA 140 137  K 3.5 3.7  CL 102 102  CO2 27 26  GLUCOSE 166* 159*  BUN 7 14  CREATININE 0.67 0.69  CALCIUM 9.3 8.3*   GFR Estimated Creatinine Clearance: 81.8 mL/min (by C-G formula based on SCr of 0.69 mg/dL). Liver Function Tests: Recent Labs  Lab  01/31/18 0239  AST 29  ALT 23  ALKPHOS 101  BILITOT 1.1  PROT 8.0  ALBUMIN 4.6   Coagulation profile Recent Labs  Lab 01/31/18 0105  INR 1.07    CBC: Recent Labs  Lab 01/31/18 0105 02/01/18 0536  WBC 9.2 8.2  NEUTROABS 7.3  --   HGB 15.0 12.1*  HCT 42.6 35.3*  MCV 100.7* 101.1*  PLT 209 145*   CBG: Recent Labs  Lab 01/31/18 1204 01/31/18 1627 01/31/18 1953 02/01/18 0035 02/01/18 0453  GLUCAP 125* 125* 121* 206* 173*   Hgb A1c: Recent Labs    01/31/18 0105  HGBA1C 6.0*    Microbiology Recent Results (from the past 240 hour(s))  Surgical PCR screen     Status: None   Collection Time: 01/31/18  5:45 AM  Result Value Ref Range Status   MRSA, PCR NEGATIVE NEGATIVE Final   Staphylococcus aureus NEGATIVE NEGATIVE Final    Comment: (NOTE) The Xpert SA Assay (FDA approved for NASAL specimens in patients 68 years of age and older), is one component of a comprehensive surveillance program. It is not intended to diagnose infection nor to guide or monitor treatment. Performed at [facility], [place] [address], [place], [place] [address]     Procedures and diagnostic studies:  Dg Chest Port 1 View  Result Date: 01/31/2018 CLINICAL DATA:  Hip fracture, preop. EXAM: PORTABLE CHEST 1 VIEW COMPARISON:  Radiographs 07/01/2013 FINDINGS: The cardiomediastinal contours are normal. Heart size upper normal. Pulmonary vasculature is normal. No consolidation, large pleural effusion, or pneumothorax. No acute osseous abnormalities are seen. Bones are under mineralized. IMPRESSION: Low lung volumes without acute abnormality. Electronically Signed   By: Jeb Levering M.D.   On: 01/31/2018 01:42   Dg C-arm 1-60 Min-no Report  Result Date: 01/31/2018 Fluoroscopy was utilized by the requesting physician.  No radiographic interpretation.   Dg Hip Operative Unilat With Pelvis Right  Result Date: 01/31/2018 CLINICAL DATA:  Status post ORIF EXAM: OPERATIVE  right HIP (WITH PELVIS IF PERFORMED) 4 VIEWS TECHNIQUE: Fluoroscopic spot image(s) were submitted for interpretation post-operatively. COMPARISON:  01/31/2018 FINDINGS: Intramedullary rod and screw device is identified. This reduces the previous intertrochanteric fracture of the proximal right femur. No hardware complications. IMPRESSION: 1. Status post ORIF of proximal right femur. Electronically Signed   By: Kerby Moors M.D.   On: 01/31/2018 20:36   Dg Hip Unilat  With Pelvis 2-3 Views Right  Result Date: 01/31/2018 CLINICAL DATA:  Pain after fall. EXAM: DG HIP (WITH OR WITHOUT PELVIS) 2-3V RIGHT COMPARISON:  None. FINDINGS: Acute intertrochanteric fracture of the right femur is noted without significant displacement or angulation. The bony pelvis is maintained without diastasis. The pubic rami appear intact as are the acetabula. The contralateral left hip is maintained. There does appear to be an old remote fracture deformity noted of the left inferior pubic ramus. IMPRESSION: 1. Acute intertrochanteric fracture of the right femur without significant displacement or angulation. 2. Remote appearing left inferior pubic  ramus fracture. Electronically Signed   By: Ashley Royalty M.D.   On: 01/31/2018 00:54    Medications:   . acetaminophen  1,000 mg Oral Q6H  . amLODipine  5 mg Oral Daily  . atorvastatin  80 mg Oral q1800  . docusate sodium  100 mg Oral BID  . enoxaparin (LOVENOX) injection  40 mg Subcutaneous Q24H  . furosemide  20 mg Oral Q12H  . gabapentin  600 mg Oral TID  . HYDROmorphone      . insulin aspart  0-15 Units Subcutaneous TID WC  . insulin aspart  0-9 Units Subcutaneous Q4H  . ketotifen  1 drop Both Eyes BID  . latanoprost  1 drop Both Eyes QHS  . metoprolol tartrate  25 mg Oral Daily  . mupirocin ointment  1 application Nasal BID  . oxyCODONE  60 mg Oral Q8H   Continuous Infusions: . sodium chloride 75 mL/hr at 01/31/18 2355  . methocarbamol (ROBAXIN)  IV       LOS: 1  day   Jacquelynn Cree  [facility] Pager [phone]. If unable to reach me by pager, please call my cell phone at [phone].  *Please refer to [email or web address], password [email or web address] to get updated schedule on who will round on this patient, as hospitalists switch teams weekly. If 7PM-7AM, please contact night-coverage at [email or web address], password [email or web address] for any overnight needs.  02/01/2018, 7:44 AM

## 2018-02-01 NOTE — Progress Notes (Signed)
  02/01/2018 1 Day Post-Op Procedure(s) (LRB): INTRAMEDULLARY (IM) NAIL FEMORAL (Right) Patient reports pain as moderate.   Patient seen in rounds with Dr. Wynelle Link. Patient is well, has no complaints other than pain in the right hip. Denies shortness of breath, chest pain, calf pain.  They will be WBAT to the right.  Vital signs in last 24 hours: Temp:  [97.6 F (36.4 C)-99.7 F (37.6 C)] 98.5 F (36.9 C) (06/13 0544) Pulse Rate:  [51-80] 69 (06/13 0544) Resp:  [11-17] 17 (06/13 0544) BP: (114-175)/(59-86) 114/62 (06/13 0544) SpO2:  [95 %-100 %] 99 % (06/13 0544) Weight:  [77.1 kg (170 lb)] 77.1 kg (170 lb) (06/12 1738)  I&O's: I/O last 3 completed shifts: In: 2505 [P.O.:730; I.V.:1675; IV Piggyback:100] Out: 2800 [Urine:2700; Blood:100] No intake/output data recorded.  Labs: Recent Labs    01/31/18 0105 02/01/18 0536  HGB 15.0 12.1*   Recent Labs    01/31/18 0105 02/01/18 0536  WBC 9.2 8.2  RBC 4.23 3.49*  HCT 42.6 35.3*  PLT 209 145*   Recent Labs    01/31/18 0239 02/01/18 0536  NA 140 137  K 3.5 3.7  CL 102 102  CO2 27 26  BUN 7 14  CREATININE 0.67 0.69  GLUCOSE 166* 159*  CALCIUM 9.3 8.3*   Recent Labs    01/31/18 0105  INR 1.07   EXAM: General - Patient is Alert and Oriented Extremity - Neurologically intact Neurovascular intact Sensation intact distally Dorsiflexion/Plantar flexion intact Compartment soft Dressing - dressing C/D/I Motor Function - intact, moving foot and toes well on exam.   Past Medical History:  Diagnosis Date  . ALLERGIC RHINITIS 05/02/2007  . C. difficile diarrhea   . CAD (coronary artery disease)    s/p DES to LAD and LCX in 2005 // [place] in 2009 - stents patent // nuc stress test 7/17 - no ischemia, EF 56 // rare anginal pain  . Cataract   . DEPRESSION 10/27/2009  . DIABETES MELLITUS, TYPE II 05/02/2007  . DUODENITIS 12/10/2007  . ESOPHAGEAL STRICTURE 12/10/2007  . Gall stones   . GERD 12/10/2007  . Glaucoma   . H/O  hiatal hernia   . Headache(784.0)    migraines years ago  . Heart murmur    Echo 7/17: EF 55-60, normal diast fxn // Echo 1/16: mild LVH, EF 55-60, mild MR, mild LAE  . HYPERCHOLESTEROLEMIA 10/27/2009  . HYPERTENSION 05/02/2007  . Insomnia   . Myocardial infarction ([place]) 2005  . Neuromuscular disorder ([place])   . OSTEOARTHRITIS, LUMBAR SPINE 10/27/2009  . Pneumonia   . TRANSAMINASES, SERUM, ELEVATED 12/10/2007    Assessment/Plan: 1 Day Post-Op Procedure(s) (LRB): INTRAMEDULLARY (IM) NAIL FEMORAL (Right) Principal Problem:   Nondisplaced intertrochanteric fracture of right femur, initial encounter for closed fracture ([place]) Active Problems:   Diabetes ([place])   HYPERCHOLESTEROLEMIA   SMOKER   Essential hypertension   CAD (coronary artery disease)   Glaucoma   Acute encephalopathy   Overweight (BMI 25.0-29.9)  Estimated body mass index is 29.18 kg/m as calculated from the following:   Height as of this encounter: 5\' 4"  (1.626 m).   Weight as of this encounter: 77.1 kg (170 lb). Advance diet Up with therapy  DVT Prophylaxis - Lovenox Weight-Bearing as tolerated leg D/C O2 and Pulse OX and try on Room Air  Disposition will be per hospitalist once progressing with therapy and medically stable.   Theresa Duty, PA-C Orthopaedic Surgery 02/01/2018, 7:14 AM

## 2018-02-01 NOTE — Evaluation (Signed)
Physical Therapy Evaluation Patient Details Name: [name] MRN: [id number] DOB: May 01, 1946 Today's Date: 02/01/2018   History of Present Illness  [name] is an 72 y.o. male with a PMH of tobacco abuse, CAD status post stent, and diabetes last 11/19 status post fall resulting in a nondisplaced intertrochanteric femur fracture.  He subsequently underwent IM nail to the right femur 01/31/2018.  Clinical Impression  The patient tolerated mobilizing to the recliner using RW and 2 assist. Pt admitted with above diagnosis. Pt currently with functional limitations due to the deficits listed below (see PT Problem List). Pt will benefit from skilled PT to increase their independence and safety with mobility to allow discharge to the venue listed below.   The patient is very motivated to mobilize, in spite of the complaints of pain.    Follow Up Recommendations SNF    Equipment Recommendations  None recommended by PT    Recommendations for Other Services       Precautions / Restrictions Precautions Precautions: Fall Restrictions Weight Bearing Restrictions: No RLE Weight Bearing: Weight bearing as tolerated      Mobility  Bed Mobility Overal bed mobility: Needs Assistance Bed Mobility: Supine to Sit     Supine to sit: +2 for safety/equipment;+2 for physical assistance;Mod assist     General bed mobility comments: Use of pillow to support R LE over to side of bed with continued support of R LE until LE touching the floor. Assist with use of pad to scoot hips to EOB. Cues for how to self assist.   Transfers Overall transfer level: Needs assistance Equipment used: Rolling walker (2 wheeled) Transfers: Stand Pivot Transfers Sit to Stand: +2 safety/equipment;+2 physical assistance;Mod assist Stand pivot transfers: Mod assist;+2 safety/equipment;+2 physical assistance       General transfer comment: small steps to move to recliner, turn around, able to take small side  steps, cues for hand right leg support.  Ambulation/Gait                Stairs            Wheelchair Mobility    Modified Rankin (Stroke Patients Only)       Balance Overall balance assessment: Needs assistance   Sitting balance-Leahy Scale: Fair     Standing balance support: Bilateral upper extremity supported Standing balance-Leahy Scale: Poor                               Pertinent Vitals/Pain Pain Assessment: 0-10 Pain Score: 8  Pain Location: R LE Pain Descriptors / Indicators: Guarding;Grimacing;Aching;Sharp Pain Intervention(s): Monitored during session;Premedicated before session;Ice applied    Home Living Family/patient expects to be discharged to:: Private residence Living Arrangements: Other relatives   Type of Home: Mobile home Home Access: Stairs to enter Entrance Stairs-Rails: Psychiatric nurse of Steps: 4 Home Layout: One level Home Equipment: Environmental consultant - 2 wheels;Cane - quad Additional Comments: pt states he was in the process of trying to get a chair for the tub to shower on. He states he has to be very careful with LE care due to his diabetes.     Prior Function Level of Independence: Needs assistance   Gait / Transfers Assistance Needed: uses walker for ambulation  ADL's / Homemaking Assistance Needed: per chart, daughter assists with groceries and meals.         Hand Dominance        Extremity/Trunk Assessment  Upper Extremity Assessment Upper Extremity Assessment: Defer to OT evaluation    Lower Extremity Assessment Lower Extremity Assessment: RLE deficits/detail RLE Deficits / Details: requires support to move to bed edge    Cervical / Trunk Assessment Cervical / Trunk Assessment: Kyphotic  Communication   Communication: No difficulties  Cognition Arousal/Alertness: Awake/alert Behavior During Therapy: WFL for tasks assessed/performed Overall Cognitive Status: Within Functional Limits  for tasks assessed                                        General Comments      Exercises     Assessment/Plan    PT Assessment Patient needs continued PT services  PT Problem List Decreased strength;Decreased range of motion;Decreased knowledge of use of DME;Decreased activity tolerance;Decreased safety awareness;Decreased knowledge of precautions;Decreased mobility;Decreased balance;Pain       PT Treatment Interventions DME instruction;Gait training;Functional mobility training;Therapeutic activities;Patient/family education;Therapeutic exercise    PT Goals (Current goals can be found in the Care Plan section)  Acute Rehab PT Goals Patient Stated Goal: to return to independence  PT Goal Formulation: With patient/family Time For Goal Achievement: 02/15/18 Potential to Achieve Goals: Good    Frequency Min 3X/week   Barriers to discharge        Co-evaluation PT/OT/SLP Co-Evaluation/Treatment: Yes Reason for Co-Treatment: For patient/therapist safety PT goals addressed during session: Mobility/safety with mobility OT goals addressed during session: ADL's and self-care;Proper use of Adaptive equipment and DME       AM-PAC PT "6 Clicks" Daily Activity  Outcome Measure Difficulty turning over in bed (including adjusting bedclothes, sheets and blankets)?: Unable Difficulty moving from lying on back to sitting on the side of the bed? : Unable Difficulty sitting down on and standing up from a chair with arms (e.g., wheelchair, bedside commode, etc,.)?: Unable Help needed moving to and from a bed to chair (including a wheelchair)?: Total Help needed walking in hospital room?: Total Help needed climbing 3-5 steps with a railing? : Total 6 Click Score: 6    End of Session Equipment Utilized During Treatment: Gait belt Activity Tolerance: Patient tolerated treatment well Patient left: in chair;with call bell/phone within reach;with family/visitor present Nurse  Communication: Mobility status PT Visit Diagnosis: Unsteadiness on feet (R26.81);Pain Pain - Right/Left: Right Pain - part of body: Hip;Leg    Time: 6269-4854 PT Time Calculation (min) (ACUTE ONLY): 37 min   Charges:   PT Evaluation $PT Eval Low Complexity: 1 Low     PT G CodesTresa Endo PT [phone]   Claretha Cooper 02/01/2018, 1:30 PM

## 2018-02-01 NOTE — Progress Notes (Signed)
Physical Therapy Treatment Patient Details Name: [name] MRN: [id number] DOB: [birth year] Today's Date: 02/01/2018    History of Present Illness [name] is an 72 y.o. male with a PMH of tobacco abuse, CAD status post stent, and diabetes last 11/19 status post fall resulting in a nondisplaced intertrochanteric femur fracture.  He subsequently underwent IM nail to the right femur 01/31/2018.    PT Comments     Patient is making slow steady progress with mobility. Plans SNF.    Follow Up Recommendations  SNF     Equipment Recommendations  None recommended by PT    Recommendations for Other Services       Precautions / Restrictions Precautions Precautions: Fall Restrictions Weight Bearing Restrictions: No RLE Weight Bearing: Weight bearing as tolerated    Mobility  Bed Mobility Overal bed mobility: Needs Assistance Bed Mobility: Sit to Supine     Supine to sit: +2 for safety/equipment;+2 for physical assistance;Mod assist Sit to supine: Mod assist;+2 for physical assistance;+2 for safety/equipment   General bed mobility comments: patient able to slide self back onto bed, lift left leg  onto bed, assist required for right leg.  Transfers Overall transfer level: Needs assistance Equipment used: Rolling walker (2 wheeled) Transfers: Stand Pivot Transfers Sit to Stand: +2 safety/equipment;+2 physical assistance;Mod assist Stand pivot transfers: Mod assist;+2 safety/equipment;+2 physical assistance       General transfer comment: small steps to move to bed, turn around, able to take small side steps,   Ambulation/Gait                 Stairs             Wheelchair Mobility    Modified Rankin (Stroke Patients Only)       Balance Overall balance assessment: Needs assistance   Sitting balance-Leahy Scale: Fair     Standing balance support: Bilateral upper extremity supported Standing balance-Leahy Scale: Poor                              Cognition Arousal/Alertness: Awake/alert Behavior During Therapy: WFL for tasks assessed/performed Overall Cognitive Status: Within Functional Limits for tasks assessed                                        Exercises      General Comments        Pertinent Vitals/Pain Pain Score: 7  Pain Location: R LE Pain Descriptors / Indicators: Guarding;Grimacing;Aching;Sharp Pain Intervention(s): Monitored during session;Premedicated before session;Ice applied;Repositioned    Home Living Family/patient expects to be discharged to:: Private residence Living Arrangements: Other relatives   Type of Home: Mobile home Home Access: Stairs to enter Entrance Stairs-Rails: Right;Left Home Layout: One level Home Equipment: Environmental consultant - 2 wheels;Cane - quad Additional Comments: pt states he was in the process of trying to get a chair for the tub to shower on. He states he has to be very careful with LE care due to his diabetes.     Prior Function Level of Independence: Needs assistance  Gait / Transfers Assistance Needed: uses walker for ambulation ADL's / Homemaking Assistance Needed: per chart, daughter assists with groceries and meals.      PT Goals (current goals can now be found in the care plan section) Acute Rehab PT Goals Patient Stated Goal: to return to independence  PT Goal Formulation: With patient/family Time For Goal Achievement: 02/15/18 Potential to Achieve Goals: Good Progress towards PT goals: Progressing toward goals    Frequency    Min 3X/week      PT Plan Current plan remains appropriate    Co-evaluation PT/OT/SLP Co-Evaluation/Treatment: Yes Reason for Co-Treatment: For patient/therapist safety PT goals addressed during session: Mobility/safety with mobility OT goals addressed during session: ADL's and self-care;Proper use of Adaptive equipment and DME      AM-PAC PT "6 Clicks" Daily Activity  Outcome Measure  Difficulty  turning over in bed (including adjusting bedclothes, sheets and blankets)?: Unable Difficulty moving from lying on back to sitting on the side of the bed? : Unable Difficulty sitting down on and standing up from a chair with arms (e.g., wheelchair, bedside commode, etc,.)?: Unable Help needed moving to and from a bed to chair (including a wheelchair)?: Total Help needed walking in hospital room?: Total Help needed climbing 3-5 steps with a railing? : Total 6 Click Score: 6    End of Session Equipment Utilized During Treatment: Gait belt Activity Tolerance: Patient tolerated treatment well Patient left: in bed;with call bell/phone within reach;with family/visitor present Nurse Communication: Mobility status PT Visit Diagnosis: Unsteadiness on feet (R26.81);Pain Pain - Right/Left: Right Pain - part of body: Hip;Leg     Time: 2595-6387 PT Time Calculation (min) (ACUTE ONLY): 17 min  Charges:  $Therapeutic Activity: 8-22 mins                    G Codes:       Claretha Cooper 02/01/2018, 4:12 PM  Tresa Endo PT [phone]

## 2018-02-01 NOTE — Progress Notes (Signed)
Plan for d/c to SNF, discharge planning per CSW. [phone] 

## 2018-02-01 NOTE — Discharge Instructions (Signed)
Dr. Gaynelle Arabian Total Joint Specialist [facility] [address], [place], [place] [address] [phone]  [place]   Hip Rehabilitation, Guidelines Following Surgery   BLOOD CLOT PREVENTION  Take Lovenox for ten days following surgery, then discontinue Lovenox. Then resume one baby aspirin (81 mg) once a day.  HOME CARE INSTRUCTIONS   Remove items at home which could result in a fall. This includes throw rugs or furniture in walking pathways.   ICE to the affected hip every three hours for 30 minutes at a time and then as needed for pain and swelling.  Continue to use ice on the hip for pain and swelling from surgery. You may notice swelling that will progress down to the foot and ankle.  This is normal after surgery.  Elevate the leg when you are not up walking on it.    Continue to use the breathing machine which will help keep your temperature down.  It is common for your temperature to cycle up and down following surgery, especially at night when you are not up moving around and exerting yourself.  The breathing machine keeps your lungs expanded and your temperature down.  DRESSING / WOUND CARE / SHOWERING You may shower 3 days after surgery, but keep the wounds dry during showering.  You may use an occlusive plastic wrap (Press'n Seal for example), NO SOAKING/SUBMERGING IN THE BATHTUB.  If the bandage gets wet, change with a clean dry gauze.  If the incision gets wet, pat the wound dry with a clean towel. You may start showering once you are discharged home but do not submerge the incision under water. Just pat the incision dry and apply a dry gauze dressing on daily. Change the surgical dressing daily and reapply a dry dressing each time.  ACTIVITY Walk with your walker as instructed. Use walker as long as suggested by your caregivers. Avoid periods of inactivity such as sitting longer than an hour when not asleep. This  helps prevent blood clots.  Do not drive a car for 6 weeks or until released by you surgeon.  Do not drive while taking narcotics.  WEIGHT BEARING Weight bearing as tolerated with assist device (walker, cane, etc) as directed, use it as long as suggested by your surgeon or therapist, typically at least 4-6 weeks.  MEDICATIONS See your medication summary on the After Visit Summary that the nursing staff will review with you prior to discharge.  You may have some home medications which will be placed on hold until you complete the course of blood thinner medication.  It is important for you to complete the blood thinner medication as prescribed by your surgeon.  Continue your approved medications as instructed at time of discharge.                                                 FOLLOW-UP APPOINTMENTS Make sure you keep all of your appointments after your operation with your surgeon and caregivers. You should call the office at the above phone number and make an appointment for approximately two weeks after the date of your surgery or on the date instructed by your surgeon outlined in the "After Visit Summary".  IF YOU ARE TRANSFERRED TO A SKILLED REHAB FACILITY If the patient is transferred to a skilled rehab facility following release  from the hospital, a list of the current medications will be sent to the facility for the patient to continue.  When discharged from the skilled rehab facility, please have the facility set up the patient's [place] prior to being released. Also, the skilled facility will be responsible for providing the patient with their medications at time of release from the facility to include their pain medication, the muscle relaxants, and their blood thinner medication. If the patient is still at the rehab facility at time of the two week follow up appointment, the skilled rehab facility will also need to assist the patient in arranging follow up appointment  in our office and any transportation needs.  MAKE SURE YOU:   Understand these instructions.   Get help right away if you are not doing well or get worse.   Do not submerge incision under water. Please use good hand washing techniques while changing dressing each day. May shower starting three days after surgery. Please use a clean towel to pat the incision dry following showers. Continue to use ice for pain and swelling after surgery. Do not use any lotions or creams on the incision until instructed by your surgeon.

## 2018-02-02 DIAGNOSIS — K5900 Constipation, unspecified: Secondary | ICD-10-CM | POA: Diagnosis not present

## 2018-02-02 LAB — CBC
HCT: 35.4 % — ABNORMAL LOW (ref 39.0–52.0)
Hemoglobin: 12 g/dL — ABNORMAL LOW (ref 13.0–17.0)
MCH: 34.6 pg — ABNORMAL HIGH (ref 26.0–34.0)
MCHC: 33.9 g/dL (ref 30.0–36.0)
MCV: 102 fL — AB (ref 78.0–100.0)
Platelets: 157 10*3/uL (ref 150–400)
RBC: 3.47 MIL/uL — AB (ref 4.22–5.81)
RDW: 13.4 % (ref 11.5–15.5)
WBC: 10.5 10*3/uL (ref 4.0–10.5)

## 2018-02-02 LAB — GLUCOSE, CAPILLARY
Glucose-Capillary: 148 mg/dL — ABNORMAL HIGH (ref 65–99)
Glucose-Capillary: 145 mg/dL — ABNORMAL HIGH (ref 65–99)
Glucose-Capillary: 163 mg/dL — ABNORMAL HIGH (ref 65–99)

## 2018-02-02 MED ORDER — KETOROLAC TROMETHAMINE 30 MG/ML IJ SOLN
15.0000 mg | Freq: Once | INTRAMUSCULAR | Status: AC
  Administered 2018-02-02: 15 mg via INTRAVENOUS
  Filled 2018-02-02: qty 1

## 2018-02-02 MED ORDER — ENOXAPARIN SODIUM 40 MG/0.4ML ~~LOC~~ SOLN: 40.0000 mg | SUBCUTANEOUS | 0 refills | Status: DC

## 2018-02-02 MED ORDER — KETOROLAC TROMETHAMINE 30 MG/ML IJ SOLN: 30.0000 mg | Freq: Once | INTRAMUSCULAR | Status: DC

## 2018-02-02 MED ORDER — KETOROLAC TROMETHAMINE 30 MG/ML IJ SOLN: 30.0000 mg | Freq: Four times a day (QID) | INTRAMUSCULAR | Status: DC | PRN

## 2018-02-02 MED ORDER — POLYETHYLENE GLYCOL 3350 17 G PO PACK
17.0000 g | PACK | Freq: Every day | ORAL | Status: DC
  Administered 2018-02-02 – 2018-02-04 (×3): 17 g via ORAL
  Filled 2018-02-02 (×3): qty 1

## 2018-02-02 MED ORDER — KETOROLAC TROMETHAMINE 30 MG/ML IJ SOLN
30.0000 mg | Freq: Four times a day (QID) | INTRAMUSCULAR | Status: DC | PRN
  Administered 2018-02-02 – 2018-02-04 (×5): 30 mg via INTRAVENOUS
  Filled 2018-02-02 (×5): qty 1

## 2018-02-02 MED ORDER — HYDROXYZINE HCL 25 MG PO TABS
25.0000 mg | ORAL_TABLET | ORAL | Status: DC | PRN
  Administered 2018-02-02: 25 mg via ORAL
  Filled 2018-02-02: qty 1

## 2018-02-02 NOTE — Progress Notes (Signed)
02/02/2018 2 Days Post-Op Procedure(s) (LRB): INTRAMEDULLARY (IM) NAIL FEMORAL (Right) Patient reports pain as mild.   Patient seen in rounds with Dr. Wynelle Link. Patient is well, and has had no acute complaints or problems other than pain in the right hip. Requiring 2+ assist for therapy, believe he would benefit best from discharging to SNF after hospital stay for 24 hour assistance with mobility. Will continue to work with therapy today.  Vital signs in last 24 hours: Temp:  [98 F (36.7 C)-99 F (37.2 C)] 98 F (36.7 C) (06/14 0530) Pulse Rate:  [63-71] 66 (06/14 0530) Resp:  [16-17] 16 (06/14 0530) BP: (106-123)/(52-87) 123/87 (06/14 0530) SpO2:  [95 %-98 %] 95 % (06/14 0530)  I&O's: I/O last 3 completed shifts: In: 1696 [P.O.:1660; I.V.:2936; IV Piggyback:100] Out: 4575 [Urine:4475; Blood:100] No intake/output data recorded.  Labs: Recent Labs    01/31/18 0105 02/01/18 0536 02/02/18 0542  HGB 15.0 12.1* 12.0*   Recent Labs    02/01/18 0536 02/02/18 0542  WBC 8.2 10.5  RBC 3.49* 3.47*  HCT 35.3* 35.4*  PLT 145* 157   Recent Labs    01/31/18 0239 02/01/18 0536  NA 140 137  K 3.5 3.7  CL 102 102  CO2 27 26  BUN 7 14  CREATININE 0.67 0.69  GLUCOSE 166* 159*  CALCIUM 9.3 8.3*   Recent Labs    01/31/18 0105  INR 1.07   EXAM: General - Patient is Alert and Oriented Extremity - Neurologically intact Neurovascular intact Sensation intact distally Dorsiflexion/Plantar flexion intact Dressing - dressing C/D/I Motor Function - intact, moving foot and toes well on exam.   Past Medical History:  Diagnosis Date  . ALLERGIC RHINITIS 05/02/2007  . C. difficile diarrhea   . CAD (coronary artery disease)    s/p DES to LAD and LCX in 2005 // [place] in 2009 - stents patent // nuc stress test 7/17 - no ischemia, EF 56 // rare anginal pain  . Cataract   . DEPRESSION 10/27/2009  . DIABETES MELLITUS, TYPE II 05/02/2007  . DUODENITIS 12/10/2007  . ESOPHAGEAL STRICTURE  12/10/2007  . Gall stones   . GERD 12/10/2007  . Glaucoma   . H/O hiatal hernia   . Headache(784.0)    migraines years ago  . Heart murmur    Echo 7/17: EF 55-60, normal diast fxn // Echo 1/16: mild LVH, EF 55-60, mild MR, mild LAE  . HYPERCHOLESTEROLEMIA 10/27/2009  . HYPERTENSION 05/02/2007  . Insomnia   . Myocardial infarction ([place]) 2005  . Neuromuscular disorder ([place])   . OSTEOARTHRITIS, LUMBAR SPINE 10/27/2009  . Pneumonia   . TRANSAMINASES, SERUM, ELEVATED 12/10/2007    Assessment/Plan: 2 Days Post-Op Procedure(s) (LRB): INTRAMEDULLARY (IM) NAIL FEMORAL (Right) Principal Problem:   Nondisplaced intertrochanteric fracture of right femur, initial encounter for closed fracture ([place]) Active Problems:   Diabetes ([place])   HYPERCHOLESTEROLEMIA   SMOKER   Essential hypertension   CAD (coronary artery disease)   Glaucoma   Acute encephalopathy   Overweight (BMI 25.0-29.9)   Acute blood loss as cause of postoperative anemia  Estimated body mass index is 29.18 kg/m as calculated from the following:   Height as of this encounter: 5\' 4"  (1.626 m).   Weight as of this encounter: 77.1 kg (170 lb). Up with therapy  DVT Prophylaxis - Lovenox Weight-Bearing as tolerated  Will continue to work with therapy today. Plan for D/C to SNF after hospital stay for 24 hour assistance and supervision with mobility.  Theresa Duty, PA-C Orthopaedic Surgery 02/02/2018, 7:23 AM

## 2018-02-02 NOTE — Progress Notes (Signed)
Progress Note    [name]  [name]:[id number] DOB: 11-Feb-1946  DOA: 01/30/2018 PCP: [name], MD    Brief Narrative:   Chief complaint: F/U femur fracture  Medical records reviewed and are as summarized below:  [name] is an 72 y.o. male with a PMH of tobacco abuse, CAD status post stent, and diabetes last 11/19 status post fall resulting in a nondisplaced intertrochanteric femur fracture.  He subsequently underwent IM nail to the right femur 01/31/2018.  Assessment/Plan:   Principal Problem:   Nondisplaced intertrochanteric fracture of right femur, initial encounter for closed fracture (HCC) Status post IM nail right femur 01/31/2018.  PT/OT. Doing well post-operatively. WBAT per ortho. Still needing IV pain meds for pain control. Add IV Toradol. SNF recommended.  Active Problems:   Constipation Add daily MiraLAX.    Acute blood loss anemia Hemoglobin stable.  No current indication for transfusion.  3 g drop in hemoglobin noted postoperatively.    Diabetes ([place]) Continue moderate scale SSI before meals.  CBGs 85-173.    HYPERCHOLESTEROLEMIA Continue Lipitor.    SMOKER Start nicotine patch, 21 mg transdermally daily. Patient reports he smokes 2 PPD, and has never tried nicotine replacement or other methods to help him quit.  He is not interested in smoking at this time.  Counseled 02/01/2018.    Essential hypertension Pressure currently controlled on Norvasc, Lasix and Lopressor.    CAD (coronary artery disease) Continue statin, beta-blocker.    Glaucoma Continue Xalatan.    Acute encephalopathy Likely related to pain medications, as the patient is on fairly high doses due to his history of chronic pain.  Monitor closely.    Overweight (BMI 25.0-29.9) Body mass index is 29.18 kg/m.   Family Communication/Anticipated D/C date and plan/Code Status   DVT prophylaxis: Lovenox ordered. Code Status: Full Code.  Family Communication:  Daughter updated at the bedside. Disposition Plan: Rehab when bed available.   Medical Consultants:    Orthopedic Surgery   Anti-Infectives:    None  Subjective:   Complains of right hip pain unrelieved by Dilaudid + Oxycodone. Constipated.  No nausea or vomiting.  Appetite OK.  Objective:    Vitals:   02/01/18 1322 02/01/18 1727 02/01/18 2315 02/02/18 0530  BP: (!) 108/54 (!) 106/52 123/75 123/87  Pulse: 64 63 71 66  Resp: 16 16 16 16   Temp: 98.4 F (36.9 C) 99 F (37.2 C) 98.4 F (36.9 C) 98 F (36.7 C)  TempSrc: Oral Oral Oral Oral  SpO2: 95% 95% 96% 95%  Weight:      Height:        Intake/Output Summary (Last 24 hours) at 02/02/2018 0718 Last data filed at 02/02/2018 0600 Gross per 24 hour  Intake 2221 ml  Output 2325 ml  Net -104 ml   Filed Weights   01/31/18 0415 01/31/18 1738  Weight: 77.1 kg (170 lb) 77.1 kg (170 lb)    Exam: General: Uncomfortable appearing. Cardiovascular: Heart sounds show a regular rate, and rhythm. No gallops or rubs. No murmurs. No JVD. Lungs: Clear to auscultation bilaterally with good air movement. No rales, rhonchi or wheezes. Abdomen: Soft, nontender, nondistended with normal active bowel sounds. No masses. No hepatosplenomegaly. Skin: Warm and dry. No rashes or lesions. Extremities: No clubbing or cyanosis. No edema. Pedal pulses 2+.  Data Reviewed:   I have personally reviewed following labs and imaging studies:  Labs: Labs show the following:   Basic Metabolic Panel: Recent Labs  Lab 01/31/18 0239 02/01/18 0536  NA 140 137  K 3.5 3.7  CL 102 102  CO2 27 26  GLUCOSE 166* 159*  BUN 7 14  CREATININE 0.67 0.69  CALCIUM 9.3 8.3*   GFR Estimated Creatinine Clearance: 81.8 mL/min (by C-G formula based on SCr of 0.69 mg/dL). Liver Function Tests: Recent Labs  Lab 01/31/18 0239  AST 29  ALT 23  ALKPHOS 101  BILITOT 1.1  PROT 8.0  ALBUMIN 4.6   Coagulation profile Recent Labs  Lab 01/31/18 0105    INR 1.07    CBC: Recent Labs  Lab 01/31/18 0105 02/01/18 0536 02/02/18 0542  WBC 9.2 8.2 10.5  NEUTROABS 7.3  --   --   HGB 15.0 12.1* 12.0*  HCT 42.6 35.3* 35.4*  MCV 100.7* 101.1* 102.0*  PLT 209 145* 157   CBG: Recent Labs  Lab 02/01/18 0453 02/01/18 0855 02/01/18 1210 02/01/18 1724 02/01/18 2307  GLUCAP 173* 144* 165* 85 132*   Hgb A1c: Recent Labs    01/31/18 0105  HGBA1C 6.0*    Microbiology Recent Results (from the past 240 hour(s))  Surgical PCR screen     Status: None   Collection Time: 01/31/18  5:45 AM  Result Value Ref Range Status   MRSA, PCR NEGATIVE NEGATIVE Final   Staphylococcus aureus NEGATIVE NEGATIVE Final    Comment: (NOTE) The Xpert SA Assay (FDA approved for NASAL specimens in patients 20 years of age and older), is one component of a comprehensive surveillance program. It is not intended to diagnose infection nor to guide or monitor treatment. Performed at [facility], [place] [address] [place], [place] [address]     Procedures and diagnostic studies:  Dg C-arm 1-60 Min-no Report  Result Date: 01/31/2018 Fluoroscopy was utilized by the requesting physician.  No radiographic interpretation.   Dg Hip Operative Unilat With Pelvis Right  Result Date: 01/31/2018 CLINICAL DATA:  Status post ORIF EXAM: OPERATIVE right HIP (WITH PELVIS IF PERFORMED) 4 VIEWS TECHNIQUE: Fluoroscopic spot image(s) were submitted for interpretation post-operatively. COMPARISON:  01/31/2018 FINDINGS: Intramedullary rod and screw device is identified. This reduces the previous intertrochanteric fracture of the proximal right femur. No hardware complications. IMPRESSION: 1. Status post ORIF of proximal right femur. Electronically Signed   By: Kerby Moors M.D.   On: 01/31/2018 20:36    Medications:   . amLODipine  5 mg Oral Daily  . atorvastatin  80 mg Oral q1800  . docusate sodium  100 mg Oral BID  . enoxaparin (LOVENOX) injection  40  mg Subcutaneous Q24H  . furosemide  20 mg Oral Q12H  . gabapentin  600 mg Oral TID  . insulin aspart  0-15 Units Subcutaneous TID WC  . ketotifen  1 drop Both Eyes BID  . latanoprost  1 drop Both Eyes QHS  . metoprolol tartrate  25 mg Oral Daily  . mupirocin ointment  1 application Nasal BID  . nicotine  21 mg Transdermal Daily  . oxyCODONE  60 mg Oral Q8H   Continuous Infusions: . sodium chloride 10 mL/hr at 02/02/18 0154  . methocarbamol (ROBAXIN)  IV       LOS: 2 days   Jacquelynn Cree  [facility] Pager [phone]. If unable to reach me by pager, please call my cell phone at [phone].  *Please refer to [email or web address], password [email or web address] to get updated schedule on who will round on this patient, as hospitalists switch teams weekly. If 7PM-7AM, please contact  night-coverage at [email or web address], password [email or web address] for any overnight needs.  02/02/2018, 7:18 AM

## 2018-02-02 NOTE — Care Management Important Message (Signed)
Important Message  Patient Details  Name: OKEY ZELEK MRN: [id number] Date of Birth: 1946/06/14   Medicare Important Message Given:  Yes    Kerin Salen 02/02/2018, 11:15 AMImportant Message  Patient Details  Name: LARAMIE MEISSNER MRN: [id number] Date of Birth: [birth year]   Medicare Important Message Given:  Yes    Kerin Salen 02/02/2018, 11:15 AM

## 2018-02-02 NOTE — Progress Notes (Signed)
CSW following to assist with disposition- status 2 days post op- SNF recommended for short term therapy. Completed FL2, pasrr, referrals to area SNFs (per previous CSW assessment pt/daughter will need bed offers, no facility preference). Will need [facility] Medicare authorization initiated once bed offers in place and facility selection made.  Sharren Bridge, MSW, LCSW Clinical Social Work 02/02/2018 [phone] coverage for [phone]

## 2018-02-02 NOTE — NC FL2 (Signed)
[place] LEVEL OF CARE SCREENING TOOL     IDENTIFICATION  Patient Name: [name] Birthdate: [birth year] Sex: male Admission Date (Current Location): 01/30/2018  [facility] and [place] Number:  Herbalist and Address:  [facility],  [place] [address] [place]      Provider Number: [id number]  Attending Physician Name and Address:  Rama, Venetia Maxon, MD  Relative Name and Phone Number:       Current Level of Care: Hospital Recommended Level of Care: [place] Prior Approval Number:    Date Approved/Denied:   PASRR Number: [id number]  Discharge Plan: SNF    Current Diagnoses: Patient Active Problem List   Diagnosis Date Noted  . Constipation 02/02/2018  . Acute blood loss as cause of postoperative anemia 02/01/2018  . Nondisplaced intertrochanteric fracture of right femur, initial encounter for closed fracture ([place]) 01/31/2018  . Glaucoma 01/31/2018  . Acute encephalopathy 01/31/2018  . Overweight (BMI 25.0-29.9) 01/31/2018  . Chest pain 09/15/2014  . HYPERCHOLESTEROLEMIA 10/27/2009  . SMOKER 10/27/2009  . DEPRESSION 10/27/2009  . OSTEOARTHRITIS, LUMBAR SPINE 10/27/2009  . COUGH 10/27/2009  . NUMBNESS 08/28/2008  . ESOPHAGEAL STRICTURE 12/10/2007  . GERD 12/10/2007  . DUODENITIS 12/10/2007  . TRANSAMINASES, SERUM, ELEVATED 12/10/2007  . Diabetes ([place]) 05/02/2007  . Essential hypertension 05/02/2007  . CAD (coronary artery disease) 05/02/2007  . ALLERGIC RHINITIS 05/02/2007    Orientation RESPIRATION BLADDER Height & Weight     Self, Time, Situation, Place  Normal Continent Weight: 170 lb (77.1 kg) Height:  5\' 4"  (162.6 cm)  BEHAVIORAL SYMPTOMS/MOOD NEUROLOGICAL BOWEL NUTRITION STATUS      Continent Diet(carb modified diet)  AMBULATORY STATUS COMMUNICATION OF NEEDS Skin   Extensive Assist Verbally Surgical wounds(closed incision, hip, silicone dressing)                       Personal  Care Assistance Level of Assistance  Bathing, Feeding, Dressing Bathing Assistance: Limited assistance Feeding assistance: Independent Dressing Assistance: Limited assistance     Functional Limitations Info  Sight, Hearing, Speech Sight Info: Adequate Hearing Info: Adequate Speech Info: Adequate    SPECIAL CARE FACTORS FREQUENCY  PT (By licensed PT), OT (By licensed OT)     PT Frequency: 5x OT Frequency: 5x            Contractures Contractures Info: Not present    Additional Factors Info  Code Status, Allergies Code Status Info: full code Allergies Info: Bupropion Hcl, Doxycycline Hyclate, Tetracycline, Verapamil           Current Medications (02/02/2018):  This is the current hospital active medication list Current Facility-Administered Medications  Medication Dose Route Frequency Provider Last Rate Last Dose  . 0.9 %  sodium chloride infusion   Intravenous Continuous Rama, Venetia Maxon, MD   Stopped at 02/02/18 1401  . amLODipine (NORVASC) tablet 5 mg  5 mg Oral Daily Gaynelle Arabian, MD   5 mg at 02/02/18 1028  . atorvastatin (LIPITOR) tablet 80 mg  80 mg Oral q1800 Gaynelle Arabian, MD   80 mg at 02/01/18 1742  . docusate sodium (COLACE) capsule 100 mg  100 mg Oral BID Gaynelle Arabian, MD   100 mg at 02/02/18 1028  . enoxaparin (LOVENOX) injection 40 mg  40 mg Subcutaneous Q24H Aluisio, Pilar Plate, MD   40 mg at 02/02/18 1028  . furosemide (LASIX) tablet 20 mg  20 mg Oral Q12H Gaynelle Arabian, MD  20 mg at 02/02/18 0506  . gabapentin (NEURONTIN) capsule 600 mg  600 mg Oral TID Gaynelle Arabian, MD   600 mg at 02/02/18 1028  . HYDROmorphone (DILAUDID) injection 0.5-1 mg  0.5-1 mg Intravenous Q4H PRN Gaynelle Arabian, MD   1 mg at 02/02/18 1205  . hydrOXYzine (ATARAX/VISTARIL) tablet 25 mg  25 mg Oral Q4H PRN Rama, Venetia Maxon, MD      . insulin aspart (novoLOG) injection 0-15 Units  0-15 Units Subcutaneous TID WC Aluisio, Pilar Plate, MD   2 Units at 02/02/18 1213  . ketorolac (TORADOL)  30 MG/ML injection 30 mg  30 mg Intravenous Q6H PRN Rama, Venetia Maxon, MD      . ketotifen (ZADITOR) 0.025 % ophthalmic solution 1 drop  1 drop Both Eyes BID Gaynelle Arabian, MD   1 drop at 02/01/18 2106  . latanoprost (XALATAN) 0.005 % ophthalmic solution 1 drop  1 drop Both Eyes QHS Gaynelle Arabian, MD   1 drop at 02/01/18 2106  . menthol-cetylpyridinium (CEPACOL) lozenge 3 mg  1 lozenge Oral PRN Gaynelle Arabian, MD       Or  . phenol (CHLORASEPTIC) mouth spray 1 spray  1 spray Mouth/Throat PRN Aluisio, Pilar Plate, MD      . methocarbamol (ROBAXIN) tablet 500 mg  500 mg Oral Q6H PRN Gaynelle Arabian, MD   500 mg at 02/02/18 1439   Or  . methocarbamol (ROBAXIN) 500 mg in dextrose 5 % 50 mL IVPB  500 mg Intravenous Q6H PRN Aluisio, Pilar Plate, MD      . metoCLOPramide (REGLAN) tablet 5-10 mg  5-10 mg Oral Q8H PRN Gaynelle Arabian, MD       Or  . metoCLOPramide (REGLAN) injection 5-10 mg  5-10 mg Intravenous Q8H PRN Aluisio, Pilar Plate, MD      . metoprolol tartrate (LOPRESSOR) tablet 25 mg  25 mg Oral Daily Gaynelle Arabian, MD   25 mg at 02/02/18 1028  . nicotine (NICODERM CQ - dosed in mg/24 hours) patch 21 mg  21 mg Transdermal Daily Rama, Venetia Maxon, MD   21 mg at 02/02/18 1030  . ondansetron (ZOFRAN) tablet 4 mg  4 mg Oral Q6H PRN Gaynelle Arabian, MD       Or  . ondansetron (ZOFRAN) injection 4 mg  4 mg Intravenous Q6H PRN Aluisio, Pilar Plate, MD      . oxyCODONE (Oxy IR/ROXICODONE) immediate release tablet 10-15 mg  10-15 mg Oral Q4H PRN Gaynelle Arabian, MD   15 mg at 02/02/18 1439  . oxyCODONE (Oxy IR/ROXICODONE) immediate release tablet 5-10 mg  5-10 mg Oral Q4H PRN Gaynelle Arabian, MD   10 mg at 02/01/18 0034  . oxyCODONE (OXYCONTIN) 12 hr tablet 60 mg  60 mg Oral Q8H Aluisio, Pilar Plate, MD   60 mg at 02/02/18 1406  . polyethylene glycol (MIRALAX / GLYCOLAX) packet 17 g  17 g Oral Daily Rama, Venetia Maxon, MD   17 g at 02/02/18 1406     Discharge Medications: Please see discharge summary for a list of discharge  medications.  Relevant Imaging Results:  Relevant Lab Results:   Additional Information SS# [id number]  Nila Nephew, LCSW

## 2018-02-02 NOTE — Progress Notes (Signed)
Patient becoming extremely anxious. Apologizing to staff but feels anxious and agitated, wants to leave but verbalizes understanding for need to stay at hospital. Offered to place patient in w/c and ambulate in halls for distraction but patient reports "If I manage to get outside I'm going to want to smoke." Patient verbalizes understanding that urge to smoke is probably contributing largely to anxiety. Cont PRN meds and verbal contacts. Monitoring closely for safety/needs.

## 2018-02-03 DIAGNOSIS — I2510 Atherosclerotic heart disease of native coronary artery without angina pectoris: Secondary | ICD-10-CM

## 2018-02-03 DIAGNOSIS — I10 Essential (primary) hypertension: Secondary | ICD-10-CM

## 2018-02-03 DIAGNOSIS — D62 Acute posthemorrhagic anemia: Secondary | ICD-10-CM

## 2018-02-03 DIAGNOSIS — G9340 Encephalopathy, unspecified: Secondary | ICD-10-CM

## 2018-02-03 DIAGNOSIS — S72144A Nondisplaced intertrochanteric fracture of right femur, initial encounter for closed fracture: Principal | ICD-10-CM

## 2018-02-03 LAB — GLUCOSE, CAPILLARY
GLUCOSE-CAPILLARY: 161 mg/dL — AB (ref 65–99)
GLUCOSE-CAPILLARY: 175 mg/dL — AB (ref 65–99)
Glucose-Capillary: 118 mg/dL — ABNORMAL HIGH (ref 65–99)
Glucose-Capillary: 136 mg/dL — ABNORMAL HIGH (ref 65–99)

## 2018-02-03 LAB — CBC
HCT: 31.4 % — ABNORMAL LOW (ref 39.0–52.0)
Hemoglobin: 10.6 g/dL — ABNORMAL LOW (ref 13.0–17.0)
MCH: 34.2 pg — ABNORMAL HIGH (ref 26.0–34.0)
MCHC: 33.8 g/dL (ref 30.0–36.0)
MCV: 101.3 fL — AB (ref 78.0–100.0)
PLATELETS: 184 10*3/uL (ref 150–400)
RBC: 3.1 MIL/uL — ABNORMAL LOW (ref 4.22–5.81)
RDW: 13.5 % (ref 11.5–15.5)
WBC: 8.9 10*3/uL (ref 4.0–10.5)

## 2018-02-03 NOTE — Progress Notes (Signed)
02/03/2018 3 Days Post-Op Procedure(s) (LRB): INTRAMEDULLARY (IM) NAIL FEMORAL (Right) Patient reports pain as mild.   Patient seen in rounds with Dr. Wynelle Link. Patient is well, and has had no acute complaints or problems. Voiding without difficulty and positive flatus. Denies chest pain, SOB. Experiencing a lot of pain yesterday requiring IV medications.  They will be WBAT to the right. Plan is to go Skilled nursing facility after hospital stay.  Vital signs in last 24 hours: Temp:  [98.7 F (37.1 C)-99.7 F (37.6 C)] 98.7 F (37.1 C) (06/15 0642) Pulse Rate:  [62-79] 68 (06/15 0642) Resp:  [16-21] 21 (06/15 0642) BP: (130-143)/(61-70) 143/70 (06/15 0642) SpO2:  [93 %-95 %] 95 % (06/15 0642)  I&O's: I/O last 3 completed shifts: In: 1840.2 [P.O.:1560; I.V.:280.2] Out: 3225 [Urine:3225] Total I/O In: -  Out: 425 [Urine:425]  Labs: Recent Labs    02/01/18 0536 02/02/18 0542 02/03/18 0524  HGB 12.1* 12.0* 10.6*   Recent Labs    02/02/18 0542 02/03/18 0524  WBC 10.5 8.9  RBC 3.47* 3.10*  HCT 35.4* 31.4*  PLT 157 184   Recent Labs    02/01/18 0536  NA 137  K 3.7  CL 102  CO2 26  BUN 14  CREATININE 0.69  GLUCOSE 159*  CALCIUM 8.3*    EXAM: General - Patient is Alert and Oriented Extremity - Neurologically intact Sensation intact distally Dorsiflexion/Plantar flexion intact Dressing - changed this AM. Incision clean, dry, and intact with no drainage.  Motor Function - intact, moving foot and toes well on exam.    Past Medical History:  Diagnosis Date  . ALLERGIC RHINITIS 05/02/2007  . C. difficile diarrhea   . CAD (coronary artery disease)    s/p DES to LAD and LCX in 2005 // [place] in 2009 - stents patent // nuc stress test 7/17 - no ischemia, EF 56 // rare anginal pain  . Cataract   . DEPRESSION 10/27/2009  . DIABETES MELLITUS, TYPE II 05/02/2007  . DUODENITIS 12/10/2007  . ESOPHAGEAL STRICTURE 12/10/2007  . Gall stones   . GERD 12/10/2007  . Glaucoma     . H/O hiatal hernia   . Headache(784.0)    migraines years ago  . Heart murmur    Echo 7/17: EF 55-60, normal diast fxn // Echo 1/16: mild LVH, EF 55-60, mild MR, mild LAE  . HYPERCHOLESTEROLEMIA 10/27/2009  . HYPERTENSION 05/02/2007  . Insomnia   . Myocardial infarction ([place]) 2005  . Neuromuscular disorder ([place])   . OSTEOARTHRITIS, LUMBAR SPINE 10/27/2009  . Pneumonia   . TRANSAMINASES, SERUM, ELEVATED 12/10/2007    Assessment/Plan: 3 Days Post-Op Procedure(s) (LRB): INTRAMEDULLARY (IM) NAIL FEMORAL (Right) Principal Problem:   Nondisplaced intertrochanteric fracture of right femur, initial encounter for closed fracture ([place]) Active Problems:   Diabetes ([place])   HYPERCHOLESTEROLEMIA   SMOKER   Essential hypertension   CAD (coronary artery disease)   Glaucoma   Acute encephalopathy   Overweight (BMI 25.0-29.9)   Acute blood loss as cause of postoperative anemia   Constipation  Estimated body mass index is 29.18 kg/m as calculated from the following:   Height as of this encounter: 5\' 4"  (1.626 m).   Weight as of this encounter: 77.1 kg (170 lb). Up with therapy  DVT Prophylaxis - Lovenox Weight-Bearing as tolerated  Pt still requiring IV pain medication, however post-operative management is complicated by history of chronic pain with opioid use.  Will plan for D/C to SNF for 24 hour assistance and  supervision with mobility on Monday.  Theresa Duty, PA-C Orthopaedic Surgery 02/03/2018, 8:24 AM

## 2018-02-03 NOTE — Progress Notes (Signed)
PROGRESS NOTE    [name]  [id number]:[id number] DOB: 1945/10/04 DOA: 01/30/2018 PCP: [name], MD   Brief Narrative: [name] is a 72 y.o. male with a history of tobacco abuse, CAD s/p stent placement, diabetes, chronic pain, hypertension. He presented secondary to a fall leading to a right femur fracture. He underwent surgery on 6/12 and course is complicated    Assessment & Plan:   Principal Problem:   Nondisplaced intertrochanteric fracture of right femur, initial encounter for closed fracture ([place]) Active Problems:   Diabetes ([place])   HYPERCHOLESTEROLEMIA   SMOKER   Essential hypertension   CAD (coronary artery disease)   Glaucoma   Acute encephalopathy   Overweight (BMI 25.0-29.9)   Acute blood loss as cause of postoperative anemia   Constipation   Right femur fracture S/p intramedullary nail on 6/12. WBAT -PT/OT: SNF -Pain management (complicated by chronic pain -Attempt to wean off IV narcotic -Continue Toradol -Bowel regimen  Constipation In setting of chronic opioid use -Continue bowel regimen  Chronic pain -continue Oxycontin and Oxy IR  Acute blood loss anemia In setting of surgery. Down slightly. No evidence of bleeding.  Diabetes mellitus -Continue SSI  Hyperlipidemia -Continue Lipitor  Smoker -Continue nicotine patch  Essential hypertension -Continue amlodipine, metoprolol  CAD -Continue Lipitor  Glaucoma -Continue Xalatan  Acute encephalopathy In setting of narcotic use. Appears to be resolved currently.   DVT prophylaxis: Lovenox Code Status:   Code Status: Full Code Family Communication: Daughter at bedside Disposition Plan: Discharge to SNF when off IV pain medication   Consultants:   Orthopedic surgery  Procedures:   6/12: Intramedullary nail  Antimicrobials:  None    Subjective: Pain in his leg  Objective: Vitals:   02/02/18 0530 02/02/18 1334 02/02/18 2216 02/03/18 0642  BP: 123/87  130/61 137/61 (!) 143/70  Pulse: 66 79 62 68  Resp: 16 16 18  (!) 21  Temp: 98 F (36.7 C) 98.8 F (37.1 C) 99.7 F (37.6 C) 98.7 F (37.1 C)  TempSrc: Oral Oral Axillary Oral  SpO2: 95% 95% 93% 95%  Weight:      Height:        Intake/Output Summary (Last 24 hours) at 02/03/2018 1251 Last data filed at 02/03/2018 1053 Gross per 24 hour  Intake 740.17 ml  Output 675 ml  Net 65.17 ml   Filed Weights   01/31/18 0415 01/31/18 1738  Weight: 77.1 kg (170 lb) 77.1 kg (170 lb)    Examination:  General exam: Appears calm and comfortable Respiratory system: Clear to auscultation. Respiratory effort normal. Cardiovascular system: S1 & S2 heard, RRR. 2/6 systolic murmur heard best at apex Gastrointestinal system: Abdomen is nondistended, soft and nontender. Normal bowel sounds heard. Central nervous system: Alert and oriented. No focal neurological deficits. Extremities: No edema. No calf tenderness Skin: No cyanosis. No rashes Psychiatry: Judgement and insight appear normal. Mood & affect appropriate.     Data Reviewed: I have personally reviewed following labs and imaging studies  CBC: Recent Labs  Lab 01/31/18 0105 02/01/18 0536 02/02/18 0542 02/03/18 0524  WBC 9.2 8.2 10.5 8.9  NEUTROABS 7.3  --   --   --   HGB 15.0 12.1* 12.0* 10.6*  HCT 42.6 35.3* 35.4* 31.4*  MCV 100.7* 101.1* 102.0* 101.3*  PLT 209 145* 157 016   Basic Metabolic Panel: Recent Labs  Lab 01/31/18 0239 02/01/18 0536  NA 140 137  K 3.5 3.7  CL 102 102  CO2 27 26  GLUCOSE 166* 159*  BUN 7 14  CREATININE 0.67 0.69  CALCIUM 9.3 8.3*   GFR: Estimated Creatinine Clearance: 81.8 mL/min (by C-G formula based on SCr of 0.69 mg/dL). Liver Function Tests: Recent Labs  Lab 01/31/18 0239  AST 29  ALT 23  ALKPHOS 101  BILITOT 1.1  PROT 8.0  ALBUMIN 4.6   No results for input(s): LIPASE, AMYLASE in the last 168 hours. No results for input(s): AMMONIA in the last 168 hours. Coagulation  Profile: Recent Labs  Lab 01/31/18 0105  INR 1.07   Cardiac Enzymes: No results for input(s): CKTOTAL, CKMB, CKMBINDEX, TROPONINI in the last 168 hours. BNP (last 3 results) No results for input(s): PROBNP in the last 8760 hours. HbA1C: No results for input(s): HGBA1C in the last 72 hours. CBG: Recent Labs  Lab 02/02/18 0736 02/02/18 1150 02/02/18 1705 02/03/18 0809 02/03/18 1215  GLUCAP 148* 145* 163* 161* 175*   Lipid Profile: No results for input(s): CHOL, HDL, LDLCALC, TRIG, CHOLHDL, LDLDIRECT in the last 72 hours. Thyroid Function Tests: No results for input(s): TSH, T4TOTAL, FREET4, T3FREE, THYROIDAB in the last 72 hours. Anemia Panel: No results for input(s): VITAMINB12, FOLATE, FERRITIN, TIBC, IRON, RETICCTPCT in the last 72 hours. Sepsis Labs: No results for input(s): PROCALCITON, LATICACIDVEN in the last 168 hours.  Recent Results (from the past 240 hour(s))  Surgical PCR screen     Status: None   Collection Time: 01/31/18  5:45 AM  Result Value Ref Range Status   MRSA, PCR NEGATIVE NEGATIVE Final   Staphylococcus aureus NEGATIVE NEGATIVE Final    Comment: (NOTE) The Xpert SA Assay (FDA approved for NASAL specimens in patients 22 years of age and older), is one component of a comprehensive surveillance program. It is not intended to diagnose infection nor to guide or monitor treatment. Performed at [facility], [place] [address] [place], [place] [address]          Radiology Studies: No results found.      Scheduled Meds: . amLODipine  5 mg Oral Daily  . atorvastatin  80 mg Oral q1800  . docusate sodium  100 mg Oral BID  . enoxaparin (LOVENOX) injection  40 mg Subcutaneous Q24H  . furosemide  20 mg Oral Q12H  . gabapentin  600 mg Oral TID  . insulin aspart  0-15 Units Subcutaneous TID WC  . ketotifen  1 drop Both Eyes BID  . latanoprost  1 drop Both Eyes QHS  . metoprolol tartrate  25 mg Oral Daily  . nicotine  21 mg  Transdermal Daily  . oxyCODONE  60 mg Oral Q8H  . polyethylene glycol  17 g Oral Daily   Continuous Infusions: . sodium chloride Stopped (02/02/18 1401)  . methocarbamol (ROBAXIN)  IV       LOS: 3 days     Cordelia Poche, MD [facility] 02/03/2018, 12:51 PM Pager: [phone]  If 7PM-7AM, please contact night-coverage [email or web address] 02/03/2018, 12:51 PM

## 2018-02-03 NOTE — Progress Notes (Signed)
Physical Therapy Treatment Patient Details Name: [name] MRN: [id number] DOB: Mar 23, 1946 Today's Date: 02/03/2018    History of Present Illness LATASHA PUSKAS is an 72 y.o. male with a PMH of tobacco abuse, CAD status post stent, and diabetes last 11/19 status post fall resulting in a nondisplaced intertrochanteric femur fracture.  He subsequently underwent IM nail to the right femur 01/31/2018.    PT Comments    Pt progressing, able to amb ~ 7' with RW and  min assist, fatigues easily but is very motivated; continue to recommend SNF  Follow Up Recommendations  SNF     Equipment Recommendations  None recommended by PT    Recommendations for Other Services       Precautions / Restrictions Precautions Precautions: Fall Restrictions RLE Weight Bearing: Weight bearing as tolerated    Mobility  Bed Mobility               General bed mobility comments: pt in chair  Transfers Overall transfer level: Needs assistance Equipment used: Rolling walker (2 wheeled) Transfers: Sit to/from Stand Sit to Stand: Min assist         General transfer comment: cues for hand placement and assist to rie and control descent  Ambulation/Gait Ambulation/Gait assistance: Min assist Gait Distance (Feet): 7 Feet Assistive device: Rolling walker (2 wheeled) Gait Pattern/deviations: Step-to pattern;Trunk flexed     General Gait Details: cues for sequence and RW position   Stairs             Wheelchair Mobility    Modified Rankin (Stroke Patients Only)       Balance                                            Cognition Arousal/Alertness: Awake/alert Behavior During Therapy: WFL for tasks assessed/performed Overall Cognitive Status: Within Functional Limits for tasks assessed                                        Exercises General Exercises - Lower Extremity Ankle Circles/Pumps: AROM;Both;10 reps Quad Sets: AROM;Both;10  reps    General Comments        Pertinent Vitals/Pain Pain Assessment: 0-10 Pain Score: 4  Pain Location: R LE Pain Descriptors / Indicators: Guarding;Grimacing;Aching Pain Intervention(s): Monitored during session    Home Living                      Prior Function            PT Goals (current goals can now be found in the care plan section) Acute Rehab PT Goals Patient Stated Goal: to return to independence  PT Goal Formulation: With patient/family Time For Goal Achievement: 02/15/18 Potential to Achieve Goals: Good Progress towards PT goals: Progressing toward goals    Frequency    Min 3X/week      PT Plan Current plan remains appropriate    Co-evaluation              AM-PAC PT "6 Clicks" Daily Activity  Outcome Measure  Difficulty turning over in bed (including adjusting bedclothes, sheets and blankets)?: Unable Difficulty moving from lying on back to sitting on the side of the bed? : Unable Difficulty sitting down on and standing up from  a chair with arms (e.g., wheelchair, bedside commode, etc,.)?: Unable Help needed moving to and from a bed to chair (including a wheelchair)?: A Little Help needed walking in hospital room?: A Little Help needed climbing 3-5 steps with a railing? : A Lot 6 Click Score: 11    End of Session Equipment Utilized During Treatment: Gait belt Activity Tolerance: Patient tolerated treatment well Patient left: in chair;with call bell/phone within reach;with family/visitor present   PT Visit Diagnosis: Unsteadiness on feet (R26.81);Pain Pain - Right/Left: Right Pain - part of body: Hip;Leg     Time: 1101-1130 PT Time Calculation (min) (ACUTE ONLY): 29 min  Charges:  $Gait Training: 8-22 mins $Therapeutic Activity: 8-22 mins                    G CodesKenyon Ana, PT Pager: [phone] 02/03/2018    Kenyon Ana 02/03/2018, 1:25 PM

## 2018-02-04 ENCOUNTER — Inpatient Hospital Stay (HOSPITAL_COMMUNITY): Payer: Medicare Other

## 2018-02-04 DIAGNOSIS — M79609 Pain in unspecified limb: Secondary | ICD-10-CM

## 2018-02-04 DIAGNOSIS — M79661 Pain in right lower leg: Secondary | ICD-10-CM

## 2018-02-04 LAB — BASIC METABOLIC PANEL
Anion gap: 9 (ref 5–15)
BUN: 16 mg/dL (ref 6–20)
CHLORIDE: 100 mmol/L — AB (ref 101–111)
CO2: 30 mmol/L (ref 22–32)
CREATININE: 0.79 mg/dL (ref 0.61–1.24)
Calcium: 8.2 mg/dL — ABNORMAL LOW (ref 8.9–10.3)
GFR calc Af Amer: >60 mL/min (ref >60)
GFR calc non Af Amer: >60 mL/min (ref >60)
GLUCOSE: 142 mg/dL — AB (ref 65–99)
POTASSIUM: 3.2 mmol/L — AB (ref 3.5–5.1)
SODIUM: 139 mmol/L (ref 135–145)

## 2018-02-04 LAB — CBC
HEMATOCRIT: 30.6 % — AB (ref 39.0–52.0)
Hemoglobin: 10.4 g/dL — ABNORMAL LOW (ref 13.0–17.0)
MCH: 34.3 pg — AB (ref 26.0–34.0)
MCHC: 34 g/dL (ref 30.0–36.0)
MCV: 101 fL — AB (ref 78.0–100.0)
Platelets: 194 10*3/uL (ref 150–400)
RBC: 3.03 MIL/uL — ABNORMAL LOW (ref 4.22–5.81)
RDW: 13.3 % (ref 11.5–15.5)
WBC: 7.2 10*3/uL (ref 4.0–10.5)

## 2018-02-04 LAB — GLUCOSE, CAPILLARY
Glucose-Capillary: 164 mg/dL — ABNORMAL HIGH (ref 65–99)
Glucose-Capillary: 139 mg/dL — ABNORMAL HIGH (ref 65–99)
Glucose-Capillary: 149 mg/dL — ABNORMAL HIGH (ref 65–99)
Glucose-Capillary: 159 mg/dL — ABNORMAL HIGH (ref 65–99)

## 2018-02-04 NOTE — Progress Notes (Signed)
     Subjective: 4 Days Post-Op Procedure(s) (LRB): INTRAMEDULLARY (IM) NAIL FEMORAL (Right)   Patient reports pain as mild. Patient is well, and has had no acute complaints or problems. Voiding without difficulty and positive flatus. Denies chest pain, SOB.  They will be WBAT to the right. Plan is to go Skilled nursing facility after hospital stay.    Objective:   VITALS:   Vitals:   02/03/18 2217 02/04/18 0553  BP: (!) 147/67 (!) 150/65  Pulse: 98 64  Resp: 16 16  Temp: 99.1 F (37.3 C) 98.4 F (36.9 C)  SpO2: 95% 98%    Dorsiflexion/Plantar flexion intact Incision: dressing C/D/I No cellulitis present Compartment soft  LABS Recent Labs    02/02/18 0542 02/03/18 0524  HGB 12.0* 10.6*  HCT 35.4* 31.4*  WBC 10.5 8.9  PLT 157 184       Assessment/Plan: 4 Days Post-Op Procedure(s) (LRB): INTRAMEDULLARY (IM) NAIL FEMORAL (Right)   Up with therapy Discharge to SNF when appropriate    [place]. Ianna Salmela   PAC  02/04/2018, 9:26 AM

## 2018-02-04 NOTE — Progress Notes (Signed)
Preliminary notes--Bilateral lower extremities venous duplex exam completed. Negative for DVT where veins visualized.  Calf veins has limited visibility to be able to see compression, exam cannot fully exclude calf veins thrombosis.   [name] (RDMS RVT) 02/04/18 2:43 PM

## 2018-02-04 NOTE — Progress Notes (Signed)
SNF bed offers provided to patient-no choice at this time  Jorge Ny, [place] Social Worker [phone]

## 2018-02-04 NOTE — Progress Notes (Addendum)
PROGRESS NOTE    [name]  FFM:[id number] DOB: 02/17/1946 DOA: 01/30/2018 PCP: [name], MD   Brief Narrative: [name] is a 72 y.o. male with a history of tobacco abuse, CAD s/p stent placement, diabetes, chronic pain, hypertension. He presented secondary to a fall leading to a right femur fracture. He underwent surgery on 6/12 and course is complicated    Assessment & Plan:   Principal Problem:   Nondisplaced intertrochanteric fracture of right femur, initial encounter for closed fracture ([place]) Active Problems:   Diabetes ([place])   HYPERCHOLESTEROLEMIA   SMOKER   Essential hypertension   CAD (coronary artery disease)   Glaucoma   Acute encephalopathy   Overweight (BMI 25.0-29.9)   Acute blood loss as cause of postoperative anemia   Constipation   Right femur fracture S/p intramedullary nail on 6/12. WBAT. -PT/OT: SNF -Pain management (complicated by chronic pain) -Continue Toradol (encouraged to use IV Toradol only if pain not managed by oxycodone) -Bowel regimen  Constipation In setting of chronic opioid use -Continue bowel regimen  Chronic pain -continue Oxycontin and Oxy IR  Acute blood loss anemia In setting of surgery. Down slightly. No evidence of bleeding. -CBC pending  Diabetes mellitus -Continue SSI  Hyperlipidemia -Continue Lipitor  Smoker -Continue nicotine patch  Essential hypertension -Continue amlodipine, metoprolol  CAD -Continue Lipitor  Glaucoma -Continue Xalatan  Acute encephalopathy In setting of narcotic use. Appears to be resolved currently.  Right calf pain -We will obtain lower extremity venous duplex   DVT prophylaxis: Lovenox Code Status:   Code Status: Full Code Family Communication: Daughter at bedside Disposition Plan: Discharge to SNF when off IV pain medication   Consultants:   Orthopedic surgery  Procedures:   6/12: Intramedullary nail  Antimicrobials:  None     Subjective: Pain is better managed.   Objective: Vitals:   02/03/18 0642 02/03/18 1352 02/03/18 2217 02/04/18 0553  BP: (!) 143/70 (!) 134/58 (!) 147/67 (!) 150/65  Pulse: 68 62 98 64  Resp: (!) 21 16 16 16   Temp: 98.7 F (37.1 C) 98.8 F (37.1 C) 99.1 F (37.3 C) 98.4 F (36.9 C)  TempSrc: Oral Oral Oral Oral  SpO2: 95%  95% 98%  Weight:      Height:        Intake/Output Summary (Last 24 hours) at 02/04/2018 0921 Last data filed at 02/03/2018 2200 Gross per 24 hour  Intake 120 ml  Output -  Net 120 ml   Filed Weights   01/31/18 0415 01/31/18 1738  Weight: 77.1 kg (170 lb) 77.1 kg (170 lb)    Examination:  General exam: Appears calm and comfortable Respiratory system: Clear to auscultation. Respiratory effort normal. Cardiovascular system: S1 & S2 heard, RRR. 2/6 systolic murmur Gastrointestinal system: Abdomen is nondistended, soft and nontender. Normal bowel sounds heard. Central nervous system: Alert and oriented. No focal neurological deficits. Extremities: No edema. Right sided calf tenderness, with no surrounding erythema Skin: No cyanosis. No rashes Psychiatry: Judgement and insight appear normal. Mood & affect appropriate.     Data Reviewed: I have personally reviewed following labs and imaging studies  CBC: Recent Labs  Lab 01/31/18 0105 02/01/18 0536 02/02/18 0542 02/03/18 0524  WBC 9.2 8.2 10.5 8.9  NEUTROABS 7.3  --   --   --   HGB 15.0 12.1* 12.0* 10.6*  HCT 42.6 35.3* 35.4* 31.4*  MCV 100.7* 101.1* 102.0* 101.3*  PLT 209 145* 157 570   Basic Metabolic Panel: Recent  Labs  Lab 01/31/18 0239 02/01/18 0536  NA 140 137  K 3.5 3.7  CL 102 102  CO2 27 26  GLUCOSE 166* 159*  BUN 7 14  CREATININE 0.67 0.69  CALCIUM 9.3 8.3*   GFR: Estimated Creatinine Clearance: 81.8 mL/min (by C-G formula based on SCr of 0.69 mg/dL). Liver Function Tests: Recent Labs  Lab 01/31/18 0239  AST 29  ALT 23  ALKPHOS 101  BILITOT 1.1  PROT 8.0   ALBUMIN 4.6   No results for input(s): LIPASE, AMYLASE in the last 168 hours. No results for input(s): AMMONIA in the last 168 hours. Coagulation Profile: Recent Labs  Lab 01/31/18 0105  INR 1.07   Cardiac Enzymes: No results for input(s): CKTOTAL, CKMB, CKMBINDEX, TROPONINI in the last 168 hours. BNP (last 3 results) No results for input(s): PROBNP in the last 8760 hours. HbA1C: No results for input(s): HGBA1C in the last 72 hours. CBG: Recent Labs  Lab 02/03/18 0809 02/03/18 1215 02/03/18 1651 02/03/18 2214 02/04/18 0729  GLUCAP 161* 175* 118* 136* 164*   Lipid Profile: No results for input(s): CHOL, HDL, LDLCALC, TRIG, CHOLHDL, LDLDIRECT in the last 72 hours. Thyroid Function Tests: No results for input(s): TSH, T4TOTAL, FREET4, T3FREE, THYROIDAB in the last 72 hours. Anemia Panel: No results for input(s): VITAMINB12, FOLATE, FERRITIN, TIBC, IRON, RETICCTPCT in the last 72 hours. Sepsis Labs: No results for input(s): PROCALCITON, LATICACIDVEN in the last 168 hours.  Recent Results (from the past 240 hour(s))  Surgical PCR screen     Status: None   Collection Time: 01/31/18  5:45 AM  Result Value Ref Range Status   MRSA, PCR NEGATIVE NEGATIVE Final   Staphylococcus aureus NEGATIVE NEGATIVE Final    Comment: (NOTE) The Xpert SA Assay (FDA approved for NASAL specimens in patients 75 years of age and older), is one component of a comprehensive surveillance program. It is not intended to diagnose infection nor to guide or monitor treatment. Performed at [facility], [place] [address] [place], [place] [address]          Radiology Studies: No results found.      Scheduled Meds: . amLODipine  5 mg Oral Daily  . atorvastatin  80 mg Oral q1800  . docusate sodium  100 mg Oral BID  . enoxaparin (LOVENOX) injection  40 mg Subcutaneous Q24H  . furosemide  20 mg Oral Q12H  . gabapentin  600 mg Oral TID  . insulin aspart  0-15 Units  Subcutaneous TID WC  . ketotifen  1 drop Both Eyes BID  . latanoprost  1 drop Both Eyes QHS  . metoprolol tartrate  25 mg Oral Daily  . nicotine  21 mg Transdermal Daily  . oxyCODONE  60 mg Oral Q8H  . polyethylene glycol  17 g Oral Daily   Continuous Infusions: . sodium chloride Stopped (02/02/18 1401)  . methocarbamol (ROBAXIN)  IV       LOS: 4 days     Cordelia Poche, MD [facility] 02/04/2018, 9:21 AM Pager: [phone]  If 7PM-7AM, please contact night-coverage [email or web address] 02/04/2018, 9:21 AM

## 2018-02-05 ENCOUNTER — Inpatient Hospital Stay (HOSPITAL_COMMUNITY): Payer: Medicare Other

## 2018-02-05 DIAGNOSIS — E7800 Pure hypercholesterolemia, unspecified: Secondary | ICD-10-CM

## 2018-02-05 LAB — GLUCOSE, CAPILLARY
GLUCOSE-CAPILLARY: 125 mg/dL — AB (ref 65–99)
Glucose-Capillary: 108 mg/dL — ABNORMAL HIGH (ref 65–99)

## 2018-02-05 MED ORDER — ENOXAPARIN SODIUM 40 MG/0.4ML ~~LOC~~ SOLN: 40.0000 mg | SUBCUTANEOUS | 0 refills | Status: DC

## 2018-02-05 MED ORDER — DICLOFENAC SODIUM 50 MG PO TBEC: 50.0000 mg | DELAYED_RELEASE_TABLET | Freq: Two times a day (BID) | ORAL | 0 refills | Status: DC | PRN

## 2018-02-05 MED ORDER — DOCUSATE SODIUM 100 MG PO CAPS: 100.0000 mg | ORAL_CAPSULE | Freq: Two times a day (BID) | ORAL | 0 refills | Status: DC

## 2018-02-05 MED ORDER — POLYETHYLENE GLYCOL 3350 17 G PO PACK: 17.0000 g | PACK | Freq: Every day | ORAL | 0 refills | Status: DC

## 2018-02-05 MED ORDER — DICLOFENAC SODIUM 50 MG PO TBEC: 50.0000 mg | DELAYED_RELEASE_TABLET | Freq: Two times a day (BID) | ORAL | 0 refills | Status: DC

## 2018-02-05 NOTE — Progress Notes (Signed)
RN reviewed discharge instructions with patient and family. All questions answered.   Paperwork and prescriptions given.   NT and RN rolled patient down with all belongings to family car.

## 2018-02-05 NOTE — Progress Notes (Addendum)
Physical Therapy Treatment Patient Details Name: [name] MRN: [id number] DOB: [birth year] Today's Date: 02/05/2018    History of Present Illness      PT Comments    POD # 5 Per RN pt and spouse would like to go home vs SNF.  Pt was able to self assist to EOB, transfer safely and amb in hallway with spouse "hands on".  Also practiced 3 steps using B rails.  50% VC's pt did well with spouse "hands on" for instruction.    Follow Up Recommendations  SNF per PT eval  (pt now plans to D/C to home with spouse )  pt is performing at a safe mobility level to D/C to home Will update LPT   Equipment Recommendations  None recommended by PT    Recommendations for Other Services       Precautions / Restrictions Precautions Precautions: Fall Restrictions Weight Bearing Restrictions: No RLE Weight Bearing: Weight bearing as tolerated    Mobility  Bed Mobility Overal bed mobility: Needs Assistance Bed Mobility: Supine to Sit     Supine to sit: Supervision;Min guard     General bed mobility comments: able to self perform with increased time and use of rail/elevated bed  Transfers Overall transfer level: Needs assistance Equipment used: Rolling walker (2 wheeled) Transfers: Sit to/from Stand Sit to Stand: Supervision;Min guard Stand pivot transfers: Supervision;Min guard       General transfer comment: cues for hand placement and assist to rie and control descent  Ambulation/Gait Ambulation/Gait assistance: Supervision;Min guard Gait Distance (Feet): 35 Feet Assistive device: Rolling walker (2 wheeled) Gait Pattern/deviations: Step-to pattern;Trunk flexed Gait velocity: decreased   General Gait Details: cues for sequence and RW position with turns.  slow and steady   Stairs      3 steps B rails MinGuard Assist with spouse "hands on"        Wheelchair Mobility    Modified Rankin (Stroke Patients Only)       Balance                                             Cognition Arousal/Alertness: Awake/alert Behavior During Therapy: WFL for tasks assessed/performed Overall Cognitive Status: Within Functional Limits for tasks assessed                                        Exercises      General Comments        Pertinent Vitals/Pain Pain Assessment: 0-10 Pain Score: 7  Pain Location: R LE Pain Descriptors / Indicators: Guarding;Grimacing;Aching Pain Intervention(s): Monitored during session;Repositioned;Ice applied    Home Living                      Prior Function            PT Goals (current goals can now be found in the care plan section) Progress towards PT goals: Progressing toward goals    Frequency    Min 3X/week      PT Plan Current plan remains appropriate    Co-evaluation              AM-PAC PT "6 Clicks" Daily Activity  Outcome Measure  Difficulty turning over in bed (including adjusting bedclothes, sheets and blankets)?:  A Little Difficulty moving from lying on back to sitting on the side of the bed? : A Little Difficulty sitting down on and standing up from a chair with arms (e.g., wheelchair, bedside commode, etc,.)?: A Little Help needed moving to and from a bed to chair (including a wheelchair)?: A Little Help needed walking in hospital room?: A Little Help needed climbing 3-5 steps with a railing? : A Lot 6 Click Score: 17    End of Session Equipment Utilized During Treatment: Gait belt Activity Tolerance: Patient tolerated treatment well Patient left: in chair;with call bell/phone within reach;with family/visitor present Nurse Communication: Mobility status PT Visit Diagnosis: Unsteadiness on feet (R26.81);Pain Pain - Right/Left: Right Pain - part of body: Hip;Leg     Time: 4098-1191 PT Time Calculation (min) (ACUTE ONLY): 28 min  Charges:  $Gait Training: 8-22 mins $Therapeutic Activity: 8-22 mins                    G Codes:        Rica Koyanagi  PTA [facility]  Acute  Rehab Pager      [phone]

## 2018-02-05 NOTE — Progress Notes (Signed)
   Subjective: 5 Days Post-Op Procedure(s) (LRB): INTRAMEDULLARY (IM) NAIL FEMORAL (Right) Patient reports pain as mild.   Patient seen in rounds for Dr. Wynelle Link. Patient is well, and has had no acute complaints or problems. Voiding without difficulty and positive flatus. Denies chest pain, SOB.   Objective: Vital signs in last 24 hours: Temp:  [97.9 F (36.6 C)-99.1 F (37.3 C)] 98.8 F (37.1 C) (06/17 0533) Pulse Rate:  [61-96] 85 (06/17 0533) Resp:  [16-19] 18 (06/17 0533) BP: (139-143)/(60-72) 143/72 (06/17 0533) SpO2:  [94 %-97 %] 94 % (06/17 0533)  Intake/Output from previous day:  Intake/Output Summary (Last 24 hours) at 02/05/2018 0745 Last data filed at 02/05/2018 0600 Gross per 24 hour  Intake 1180 ml  Output 1200 ml  Net -20 ml    Intake/Output this shift: No intake/output data recorded.  Labs: Recent Labs    02/03/18 0524 02/04/18 0907  HGB 10.6* 10.4*   Recent Labs    02/03/18 0524 02/04/18 0907  WBC 8.9 7.2  RBC 3.10* 3.03*  HCT 31.4* 30.6*  PLT 184 194   Recent Labs    02/04/18 0907  NA 139  K 3.2*  CL 100*  CO2 30  BUN 16  CREATININE 0.79  GLUCOSE 142*  CALCIUM 8.2*   No results for input(s): LABPT, INR in the last 72 hours.  EXAM General - Patient is Alert and Oriented Extremity - Neurologically intact Neurovascular intact Sensation intact distally Dorsiflexion/Plantar flexion intact Dressing/Incision - clean, dry, no drainage Motor Function - intact, moving foot and toes well on exam.   Past Medical History:  Diagnosis Date  . ALLERGIC RHINITIS 05/02/2007  . C. difficile diarrhea   . CAD (coronary artery disease)    s/p DES to LAD and LCX in 2005 // [place] in 2009 - stents patent // nuc stress test 7/17 - no ischemia, EF 56 // rare anginal pain  . Cataract   . DEPRESSION 10/27/2009  . DIABETES MELLITUS, TYPE II 05/02/2007  . DUODENITIS 12/10/2007  . ESOPHAGEAL STRICTURE 12/10/2007  . Gall stones   . GERD 12/10/2007  . Glaucoma     . H/O hiatal hernia   . Headache(784.0)    migraines years ago  . Heart murmur    Echo 7/17: EF 55-60, normal diast fxn // Echo 1/16: mild LVH, EF 55-60, mild MR, mild LAE  . HYPERCHOLESTEROLEMIA 10/27/2009  . HYPERTENSION 05/02/2007  . Insomnia   . Myocardial infarction ([place]) 2005  . Neuromuscular disorder ([place])   . OSTEOARTHRITIS, LUMBAR SPINE 10/27/2009  . Pneumonia   . TRANSAMINASES, SERUM, ELEVATED 12/10/2007    Assessment/Plan: 5 Days Post-Op Procedure(s) (LRB): INTRAMEDULLARY (IM) NAIL FEMORAL (Right) Principal Problem:   Nondisplaced intertrochanteric fracture of right femur, initial encounter for closed fracture (HCC) Active Problems:   Diabetes ([place])   HYPERCHOLESTEROLEMIA   SMOKER   Essential hypertension   CAD (coronary artery disease)   Glaucoma   Acute encephalopathy   Overweight (BMI 25.0-29.9)   Acute blood loss as cause of postoperative anemia   Constipation   Right calf pain  Estimated body mass index is 29.18 kg/m as calculated from the following:   Height as of this encounter: 5\' 4"  (1.626 m).   Weight as of this encounter: 77.1 kg (170 lb). Up with therapy  DVT Prophylaxis - Lovenox WBAT Disposition: will plan for discharge to SNF when medically stable per medicine  Theresa Duty, PA-C Orthopaedic Surgery 02/05/2018, 7:45 AM

## 2018-02-05 NOTE — Progress Notes (Signed)
Discharge planning, spoke with patient and daughter at beside. Chose [facility] for [facility] services, PT to eval and treat. Contacted [facility] for referral. Has RW but needs a 3-n-1, contacted [facility] to deliver to room. [phone]

## 2018-02-05 NOTE — Discharge Summary (Signed)
Physician Discharge Summary  [name] NLG:[id number] DOB: [birth year] DOA: 01/30/2018  PCP: [name], MD  Admit date: 01/30/2018 Discharge date: 02/05/2018  Admitted From: Home Disposition: Home  Recommendations for Outpatient Follow-up:  1. Follow up with PCP in 1 week 2. Please obtain BMP/CBC in one week 3. Please follow up on the following pending results: None  Home Health: PT, OT Equipment/Devices: 3 in 1  Discharge Condition: Stable CODE STATUS: Full code Diet recommendation: Heart healthy   Brief/Interim Summary:  Admission HPI written by Annita Brod, MD   Chief Complaint: Fall  HPI: [name] is a 72 y.o. male with medical history significant for tobacco abuse, CAD status post stent placement, diabetes mellitus who was in his usual state of health when he fell landing on his right side.  Patient had immediate pain and was not able to stand.  He was brought into the emergency room.  Upon evaluation, patient was found to have a nondisplaced intertrochanteric femur fracture.  Rest the patient's labs and vitals were otherwise normal other than an elevated blood pressure in the 180s felt to be secondary to severe pain.  Patient received medication for pain and since that time, daughter notes that he is a little bit confused.  His normal baseline is fully alert and cognizant.  Seen by orthopedics who plan to take patient to the operating room on the afternoon of 6/12  Review of Systems: Patient seen after arrival to floor.  Patient having close to 8/10 pain in his right hip.  He denies any other complaints.  Denies any headache, vision changes, dysphagia, chest pain, palpitations, shortness of breath, wheeze, cough, abdominal pain, hematuria, dysuria, constipation, diarrhea, focal extremity numbness weakness or pain.  Review of systems is otherwise negative.    Hospital course:  Right femur fracture S/p intramedullary nail on 6/12. WBAT.  Pain management while inpatient. PT recommended SNF, but patient requesting discharge home. Weight bearing as tolerated. Bowel regimen for pain medication.  Constipation In setting of chronic opioid use. Continue bowel regimen  Chronic pain Continued Oxycontin and Oxy IR inpatient. Resume home regimen on discharge.  Acute blood loss anemia In setting of surgery. Down slightly. No evidence of bleeding. Stable.  Diabetes mellitus SSI while inpatient.  Hyperlipidemia CAD Continued Lipitor  Smoker Nicotine patch while inpatient.  Essential hypertension Continued amlodipine and metoprolol  Glaucoma Continued Xalatan  Acute encephalopathy In setting of narcotic use. Appears to be resolved currently.  Right calf pain Duplex scan was unable to fully evaluate her leg, however, no DVT seen on scan. Pain resolved.   Discharge Diagnoses:  Principal Problem:   Nondisplaced intertrochanteric fracture of right femur, initial encounter for closed fracture ([place]) Active Problems:   Diabetes ([place])   HYPERCHOLESTEROLEMIA   SMOKER   Essential hypertension   CAD (coronary artery disease)   Glaucoma   Acute encephalopathy   Overweight (BMI 25.0-29.9)   Acute blood loss as cause of postoperative anemia   Constipation   Right calf pain    Discharge Instructions  Discharge Instructions    Diet - low sodium heart healthy   Complete by:  As directed    Increase activity slowly   Complete by:  As directed      Allergies as of 02/05/2018      Reactions   Bupropion Hcl Other (See Comments)   "messes with my head"   Doxycycline Hyclate Diarrhea       Tetracycline Diarrhea  Verapamil Other (See Comments)   "messes with my head"      Medication List    STOP taking these medications   aspirin 81 MG tablet   dicyclomine 10 MG capsule Commonly known as:  BENTYL     TAKE these medications   amLODipine 5 MG tablet Commonly known as:  NORVASC Take 1 tablet (5 mg  total) by mouth daily.   atorvastatin 80 MG tablet Commonly known as:  LIPITOR Take 80 mg by mouth daily. What changed:  Another medication with the same name was removed. Continue taking this medication, and follow the directions you see here.   azelastine 0.05 % ophthalmic solution Commonly known as:  OPTIVAR Place 1 drop into both eyes 2 (two) times daily.   diclofenac 50 MG EC tablet Commonly known as:  VOLTAREN Take 1 tablet (50 mg total) by mouth 2 (two) times daily as needed.   docusate sodium 100 MG capsule Commonly known as:  COLACE Take 1 capsule (100 mg total) by mouth 2 (two) times daily.   enoxaparin 40 MG/0.4ML injection Commonly known as:  LOVENOX Inject 0.4 mLs (40 mg total) into the skin daily. Then, resume one baby aspirin (81 mg) once a day.   furosemide 40 MG tablet Commonly known as:  LASIX Take 20 mg by mouth every 12 (twelve) hours.   gabapentin 600 MG tablet Commonly known as:  NEURONTIN Take 600 mg by mouth 3 (three) times daily.   HYDROcodone-acetaminophen 10-325 MG tablet Commonly known as:  NORCO Take 1 tablet by mouth every 4 (four) hours as needed for moderate pain.   latanoprost 0.005 % ophthalmic solution Commonly known as:  XALATAN Place 1 drop into both eyes at bedtime.   lisinopril 20 MG tablet Commonly known as:  PRINIVIL,ZESTRIL Take 20 mg by mouth daily. What changed:  Another medication with the same name was removed. Continue taking this medication, and follow the directions you see here.   metFORMIN 1000 MG tablet Commonly known as:  GLUCOPHAGE Take 500 mg by mouth 2 (two) times daily with a meal.   methocarbamol 750 MG tablet Commonly known as:  ROBAXIN Take 750 mg by mouth 3 (three) times daily.   metoprolol tartrate 50 MG tablet Commonly known as:  LOPRESSOR Take 25 mg by mouth daily.   nitroGLYCERIN 0.4 MG SL tablet Commonly known as:  NITROSTAT PLACE 1 TABLET UNDER THE TONGUE EVERY 5MINUTES AS NEEDED FOR CHEST  PAIN X3 DOSES   ONE TOUCH ULTRA TEST test strip Generic drug:  glucose blood USE 1 TIME DAILY TO CHECK BLOOD SUGAR.   oxyCODONE-acetaminophen 10-325 MG tablet Commonly known as:  PERCOCET Take 1 tablet by mouth every 4 (four) hours.   OXYCONTIN 60 MG Tb12 Generic drug:  Oxycodone HCl Take 1 tablet by mouth every 8 (eight) hours.   polyethylene glycol packet Commonly known as:  MIRALAX / GLYCOLAX Take 17 g by mouth daily. Start taking on:  02/06/2018   potassium chloride SA 20 MEQ tablet Commonly known as:  K-DUR,KLOR-CON Take 20 mEq by mouth daily.   VENTOLIN HFA 108 (90 Base) MCG/ACT inhaler Generic drug:  albuterol Inhale 2 puffs into the lungs 2 (two) times daily as needed for shortness of breath.   VSL#3 Caps TAKE 1 CAPSULE TWICE DAILY FOR 6 WEEKS. (112.5 BILLION BACTERIA PER CAPSULE)            Durable Medical Equipment  (From admission, onward)        Start  Ordered   02/05/18 1051  For home use only DME 3 n 1  Once     02/05/18 1051     Follow-up Information    Aluisio, Pilar Plate, MD Follow up in 2 week(s).   Specialty:  Orthopedic Surgery Contact information: [address] [place] [address] [phone]        [facility] Follow up.   Specialty:  Home Health Services Why:  physical therapy Contact information: [place] [address] [place] Follow up.   Why:  3n1 Contact information: [address] [place] [address] [phone]          Allergies  Allergen Reactions  . Bupropion Hcl Other (See Comments)    "messes with my head"  . Doxycycline Hyclate Diarrhea       . Tetracycline Diarrhea  . Verapamil Other (See Comments)    "messes with my head"    Consultations:  Orthopedic surgery   Procedures/Studies: Dg Abd 1 View  Result Date: 02/05/2018 CLINICAL DATA:  Abdominal distension EXAM: ABDOMEN - 1 VIEW  COMPARISON:  None. FINDINGS: There is diffuse stool throughout the colon. There is no appreciable bowel dilatation or air-fluid level to suggest bowel obstruction. No free air. Visualized lung bases are clear. Postoperative changes noted in the right proximal femur. IMPRESSION: Diffuse stool throughout colon. Suspect constipation. No bowel obstruction or free air evident. Electronically Signed   By: Lowella Grip III M.D.   On: 02/05/2018 10:15   Dg Chest Port 1 View  Result Date: 01/31/2018 CLINICAL DATA:  Hip fracture, preop. EXAM: PORTABLE CHEST 1 VIEW COMPARISON:  Radiographs 07/01/2013 FINDINGS: The cardiomediastinal contours are normal. Heart size upper normal. Pulmonary vasculature is normal. No consolidation, large pleural effusion, or pneumothorax. No acute osseous abnormalities are seen. Bones are under mineralized. IMPRESSION: Low lung volumes without acute abnormality. Electronically Signed   By: Jeb Levering M.D.   On: 01/31/2018 01:42   Dg C-arm 1-60 Min-no Report  Result Date: 01/31/2018 Fluoroscopy was utilized by the requesting physician.  No radiographic interpretation.   Dg Hip Operative Unilat With Pelvis Right  Result Date: 01/31/2018 CLINICAL DATA:  Status post ORIF EXAM: OPERATIVE right HIP (WITH PELVIS IF PERFORMED) 4 VIEWS TECHNIQUE: Fluoroscopic spot image(s) were submitted for interpretation post-operatively. COMPARISON:  01/31/2018 FINDINGS: Intramedullary rod and screw device is identified. This reduces the previous intertrochanteric fracture of the proximal right femur. No hardware complications. IMPRESSION: 1. Status post ORIF of proximal right femur. Electronically Signed   By: Kerby Moors M.D.   On: 01/31/2018 20:36   Dg Hip Unilat  With Pelvis 2-3 Views Right  Result Date: 01/31/2018 CLINICAL DATA:  Pain after fall. EXAM: DG HIP (WITH OR WITHOUT PELVIS) 2-3V RIGHT COMPARISON:  None. FINDINGS: Acute intertrochanteric fracture of the right femur is noted  without significant displacement or angulation. The bony pelvis is maintained without diastasis. The pubic rami appear intact as are the acetabula. The contralateral left hip is maintained. There does appear to be an old remote fracture deformity noted of the left inferior pubic ramus. IMPRESSION: 1. Acute intertrochanteric fracture of the right femur without significant displacement or angulation. 2. Remote appearing left inferior pubic ramus fracture. Electronically Signed   By: Ashley Royalty M.D.   On: 01/31/2018 00:54      Subjective: Pain improved.  Discharge Exam: Vitals:   02/04/18 2136 02/05/18 0533  BP: 139/60 (!) 143/72  Pulse: 96 85  Resp: 19 18  Temp: 99.1 F (37.3 C) 98.8 F (37.1 C)  SpO2: 97% 94%   Vitals:   02/04/18 0553 02/04/18 1358 02/04/18 2136 02/05/18 0533  BP: (!) 150/65 (!) 143/61 139/60 (!) 143/72  Pulse: 64 61 96 85  Resp: 16 16 19 18   Temp: 98.4 F (36.9 C) 97.9 F (36.6 C) 99.1 F (37.3 C) 98.8 F (37.1 C)  TempSrc: Oral Oral Oral Oral  SpO2: 98% 97% 97% 94%  Weight:      Height:        General: Pt is alert, awake, not in acute distress Cardiovascular: RRR, S1/S2 +, no rubs, no gallops Respiratory: CTA bilaterally, no wheezing, no rhonchi Abdominal: Soft, NT, ND, bowel sounds + Extremities: no edema, no cyanosis    The results of significant diagnostics from this hospitalization (including imaging, microbiology, ancillary and laboratory) are listed below for reference.     Microbiology: Recent Results (from the past 240 hour(s))  Surgical PCR screen     Status: None   Collection Time: 01/31/18  5:45 AM  Result Value Ref Range Status   MRSA, PCR NEGATIVE NEGATIVE Final   Staphylococcus aureus NEGATIVE NEGATIVE Final    Comment: (NOTE) The Xpert SA Assay (FDA approved for NASAL specimens in patients 47 years of age and older), is one component of a comprehensive surveillance program. It is not intended to diagnose infection nor to guide  or monitor treatment. Performed at [facility], [place] [address] [place], [place] [address]      Labs: BNP (last 3 results) No results for input(s): BNP in the last 8760 hours. Basic Metabolic Panel: Recent Labs  Lab 01/31/18 0239 02/01/18 0536 02/04/18 0907  NA 140 137 139  K 3.5 3.7 3.2*  CL 102 102 100*  CO2 27 26 30   GLUCOSE 166* 159* 142*  BUN 7 14 16   CREATININE 0.67 0.69 0.79  CALCIUM 9.3 8.3* 8.2*   Liver Function Tests: Recent Labs  Lab 01/31/18 0239  AST 29  ALT 23  ALKPHOS 101  BILITOT 1.1  PROT 8.0  ALBUMIN 4.6   No results for input(s): LIPASE, AMYLASE in the last 168 hours. No results for input(s): AMMONIA in the last 168 hours. CBC: Recent Labs  Lab 01/31/18 0105 02/01/18 0536 02/02/18 0542 02/03/18 0524 02/04/18 0907  WBC 9.2 8.2 10.5 8.9 7.2  NEUTROABS 7.3  --   --   --   --   HGB 15.0 12.1* 12.0* 10.6* 10.4*  HCT 42.6 35.3* 35.4* 31.4* 30.6*  MCV 100.7* 101.1* 102.0* 101.3* 101.0*  PLT 209 145* 157 184 194   Cardiac Enzymes: No results for input(s): CKTOTAL, CKMB, CKMBINDEX, TROPONINI in the last 168 hours. BNP: Invalid input(s): POCBNP CBG: Recent Labs  Lab 02/04/18 0729 02/04/18 1216 02/04/18 1719 02/04/18 2140 02/05/18 0739  GLUCAP 164* 139* 149* 159* 125*   D-Dimer No results for input(s): DDIMER in the last 72 hours. Hgb A1c No results for input(s): HGBA1C in the last 72 hours. Lipid Profile No results for input(s): CHOL, HDL, LDLCALC, TRIG, CHOLHDL, LDLDIRECT in the last 72 hours. Thyroid function studies No results for input(s): TSH, T4TOTAL, T3FREE, THYROIDAB in the last 72 hours.  Invalid input(s): FREET3 Anemia work up No results for input(s): VITAMINB12, FOLATE, FERRITIN, TIBC, IRON, RETICCTPCT in the last 72 hours. Urinalysis    Component Value Date/Time   COLORURINE YELLOW 01/31/2018 [place]  01/31/2018 0545   LABSPEC 1.009 01/31/2018 0545   PHURINE 8.0 01/31/2018  0545   GLUCOSEU NEGATIVE 01/31/2018 0545   GLUCOSEU NEGATIVE 10/27/2009 0000   HGBUR SMALL (A) 01/31/2018 0545   BILIRUBINUR NEGATIVE 01/31/2018 0545   KETONESUR NEGATIVE 01/31/2018 0545   PROTEINUR NEGATIVE 01/31/2018 0545   UROBILINOGEN 1.0 09/09/2014 2015   NITRITE NEGATIVE 01/31/2018 0545   LEUKOCYTESUR NEGATIVE 01/31/2018 0545   Sepsis Labs Invalid input(s): PROCALCITONIN,  WBC,  LACTICIDVEN Microbiology Recent Results (from the past 240 hour(s))  Surgical PCR screen     Status: None   Collection Time: 01/31/18  5:45 AM  Result Value Ref Range Status   MRSA, PCR NEGATIVE NEGATIVE Final   Staphylococcus aureus NEGATIVE NEGATIVE Final    Comment: (NOTE) The Xpert SA Assay (FDA approved for NASAL specimens in patients 84 years of age and older), is one component of a comprehensive surveillance program. It is not intended to diagnose infection nor to guide or monitor treatment. Performed at [facility], [place] [address], [place], [place] [address]     SIGNED:   Cordelia Poche, MD [facility] 02/05/2018, 1:40 PM

## 2018-02-05 NOTE — Progress Notes (Signed)
CSW met with patient and daughter. Patient daughter reports the patient will go home at discharge with Home Health services.  CSW informed [place], Latanya Presser, MSW Clinical Social Worker  [phone] 02/05/2018  10:22 AM

## 2018-04-11 ENCOUNTER — Other Ambulatory Visit: Payer: Self-pay | Admitting: Physician Assistant

## 2018-04-24 ENCOUNTER — Telehealth: Payer: Self-pay | Admitting: Internal Medicine

## 2018-04-24 NOTE — Telephone Encounter (Signed)
New Message     Pt c/o swelling: STAT is pt has developed SOB within 24 hours  1) How much weight have you gained and in what time span? Unknown patient does not weigh himself daily  If swelling, where is the swelling located? Right leg 2) Are you currently taking a fluid pill? yes  3) Are you currently SOB? No   4) Do you have a log of your daily weights (if so, list)? no  Have you gained 3 pounds in a day or 5 pounds in a week? unknown 5) Have you traveled recently? No    Patients daughter is calling on behalf of her father. She states that he has swelling in his leg and clear fluid coming out of the bottom of his foot. She also states that his BP has been running high. Typically the bottom number is over 200.

## 2018-04-24 NOTE — Telephone Encounter (Signed)
Called patient's daughter back. Patient is complaining of BLE edema and pain, more on his right leg that is weeping fluid. Patient's daughter also stated patient's BP was 208/80 the other night. Patient's daughter stated patient saw his ortho doctor and PCP, and that they stated patient needs to follow up with cardiology. Made patient an appointment with [name] PA tomorrow. Will forward to Dr. Harrington Challenger for further advisement.

## 2018-04-24 NOTE — Telephone Encounter (Signed)
Agree. Needs to be seen

## 2018-04-25 ENCOUNTER — Ambulatory Visit (HOSPITAL_COMMUNITY)
Admission: RE | Admit: 2018-04-25 | Discharge: 2018-04-25 | Disposition: A | Discharge status: Home / Self Care | Payer: Medicare Other | Source: Ambulatory Visit | Attending: Cardiovascular Disease | Admitting: Cardiovascular Disease

## 2018-04-25 ENCOUNTER — Encounter: Payer: Self-pay | Admitting: Physician Assistant

## 2018-04-25 ENCOUNTER — Ambulatory Visit: Payer: Medicare Other | Admitting: Physician Assistant

## 2018-04-25 VITALS — BP 156/78 | HR 74 | Ht 64.0 in | Wt 169.1 lb

## 2018-04-25 DIAGNOSIS — I2510 Atherosclerotic heart disease of native coronary artery without angina pectoris: Secondary | ICD-10-CM

## 2018-04-25 DIAGNOSIS — E7800 Pure hypercholesterolemia, unspecified: Secondary | ICD-10-CM | POA: Diagnosis not present

## 2018-04-25 DIAGNOSIS — I10 Essential (primary) hypertension: Secondary | ICD-10-CM | POA: Diagnosis not present

## 2018-04-25 DIAGNOSIS — R600 Localized edema: Secondary | ICD-10-CM

## 2018-04-25 LAB — CBC
Hematocrit: 39.7 % (ref 37.5–51.0)
Hemoglobin: 13.7 g/dL (ref 13.0–17.7)
MCH: 32.3 pg (ref 26.6–33.0)
MCHC: 34.5 g/dL (ref 31.5–35.7)
MCV: 94 fL (ref 79–97)
PLATELETS: 227 10*3/uL (ref 150–450)
RBC: 4.24 x10E6/uL (ref 4.14–5.80)
RDW: 14.6 % (ref 12.3–15.4)
WBC: 8.6 10*3/uL (ref 3.4–10.8)

## 2018-04-25 LAB — BASIC METABOLIC PANEL
BUN / CREAT RATIO: 12 (ref 10–24)
BUN: 11 mg/dL (ref 8–27)
CO2: 27 mmol/L (ref 20–29)
CREATININE: 0.91 mg/dL (ref 0.76–1.27)
Calcium: 9.2 mg/dL (ref 8.6–10.2)
Chloride: 96 mmol/L (ref 96–106)
GFR calc Af Amer: 98 mL/min/{1.73_m2} (ref >59)
GFR calc non Af Amer: 85 mL/min/{1.73_m2} (ref >59)
GLUCOSE: 243 mg/dL — AB (ref 65–99)
Potassium: 4.4 mmol/L (ref 3.5–5.2)
SODIUM: 140 mmol/L (ref 134–144)

## 2018-04-25 LAB — PRO B NATRIURETIC PEPTIDE: NT-Pro BNP: 99 pg/mL (ref 0–376)

## 2018-04-25 LAB — TSH: TSH: 0.931 u[IU]/mL (ref 0.450–4.500)

## 2018-04-25 MED ORDER — POTASSIUM CHLORIDE CRYS ER 20 MEQ PO TBCR: 20.0000 meq | EXTENDED_RELEASE_TABLET | Freq: Two times a day (BID) | ORAL | 3 refills | Status: DC

## 2018-04-25 MED ORDER — TORSEMIDE 20 MG PO TABS: 20.0000 mg | ORAL_TABLET | Freq: Two times a day (BID) | ORAL | 3 refills | Status: DC
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
## 2018-04-25 NOTE — Patient Instructions (Signed)
Medication Instructions:  1. INCREASE TORSEMIDE TO 20 MG TWICE DAILY; NEW RX HAS BEEN SENT IN  2. INCREASE POTASSIUM TO 20 MEQ TWICE DAILY; NEW RX HAS BEEN SENT IN  Labwork: 1. TODAY BMET, CBC, TSH, PRO BNP, LFT  2. IN 1 WEEK REPEAT BMET  Testing/Procedures: 1. TODAY Your physician has requested that you have a lower extremity venous duplex. This test is an ultrasound of the veins in the legs or arms. It looks at venous blood flow that carries blood from the heart to the legs or arms. Allow one hour for a Lower Venous exam. Allow thirty minutes for an Upper Venous exam. There are no restrictions or special instructions. THIS WILL BE DONE TODAY AT 2:30 PM AT [facility]; PLEASE ARRIVE 20 MINUTES EARLY FOR REGISTRATION   2. Your physician has requested that you have an echocardiogram. Echocardiography is a painless test that uses sound waves to create images of your heart. It provides your doctor with information about the size and shape of your heart and how well your heart's chambers and valves are working. This procedure takes approximately one hour. There are no restrictions for this procedure.    Follow-Up: SC[facility]AVER,PAC IN 1 WEEK SAME DAY DR. Harrington Challenger IS IN THE OFFICE IF POSSIBLE   Any Other Special Instructions Will Be Listed Below (If Applicable).     If you need a refill on your cardiac medications before your next appointment, please call your pharmacy.

## 2018-04-26 ENCOUNTER — Telehealth: Payer: Self-pay | Admitting: *Deleted

## 2018-04-26 NOTE — Telephone Encounter (Signed)
-----   Message from Liliane Shi, [place] sent at 04/25/2018  5:47 PM EDT ----- The following abnormalities are noted:  Glucose is elevated.   Renal function, potassium, hemoglobin, TSH, BNP normal. All other values are normal, stable or within acceptable limits. Medication changes / Follow up labs / Other changes or recommendations:    - Continue current medications and follow up as planned.  [name], PA-C 04/25/2018 5:45 PM

## 2018-04-26 NOTE — Telephone Encounter (Signed)
Pt called and has been notified of lab results. Pt advised he should f/u with PCP for elevated glucose level. Pt states he will adjust his Metformin which should take of his glucose. I again did advise he should f/u with PCP. Pt is agreeable. I will fax lab results to PCP as well. Pt thanked me for the call.

## 2018-04-26 NOTE — Telephone Encounter (Signed)
Pt has been notified of LEA no DVT. Pt thanked me for the call. I will forward a copy of results to PCP Dr. Arelia Sneddon.

## 2018-04-26 NOTE — Telephone Encounter (Signed)
Left message to go over lab results. Results have been sent to [place] as well.

## 2018-04-26 NOTE — Telephone Encounter (Signed)
-----   Message from Liliane Shi, [place] sent at 04/26/2018  4:53 PM EDT ----- This study demonstrates:  No DVT bilaterally. Medication changes / Follow up studies / Other recommendations:    - Continue current medications and follow up as planned.  Please send results to the PCP:  [name], MD  [name], PA-C 04/26/2018 4:52 PM

## 2018-04-27 ENCOUNTER — Other Ambulatory Visit: Payer: Self-pay

## 2018-04-27 ENCOUNTER — Ambulatory Visit (HOSPITAL_COMMUNITY): Payer: Medicare Other | Attending: Physician Assistant

## 2018-04-27 DIAGNOSIS — I119 Hypertensive heart disease without heart failure: Secondary | ICD-10-CM | POA: Diagnosis not present

## 2018-04-27 DIAGNOSIS — I77810 Thoracic aortic ectasia: Secondary | ICD-10-CM | POA: Diagnosis not present

## 2018-04-27 DIAGNOSIS — E785 Hyperlipidemia, unspecified: Secondary | ICD-10-CM | POA: Insufficient documentation

## 2018-04-27 DIAGNOSIS — Z720 Tobacco use: Secondary | ICD-10-CM | POA: Diagnosis not present

## 2018-04-27 DIAGNOSIS — E119 Type 2 diabetes mellitus without complications: Secondary | ICD-10-CM | POA: Insufficient documentation

## 2018-04-27 DIAGNOSIS — I071 Rheumatic tricuspid insufficiency: Secondary | ICD-10-CM | POA: Insufficient documentation

## 2018-04-27 DIAGNOSIS — I2510 Atherosclerotic heart disease of native coronary artery without angina pectoris: Secondary | ICD-10-CM

## 2018-05-01 ENCOUNTER — Encounter: Payer: Self-pay | Admitting: Physician Assistant

## 2018-05-01 ENCOUNTER — Telehealth: Payer: Self-pay | Admitting: *Deleted

## 2018-05-01 DIAGNOSIS — I5032 Chronic diastolic (congestive) heart failure: Secondary | ICD-10-CM | POA: Insufficient documentation

## 2018-05-01 NOTE — Telephone Encounter (Signed)
Left message to go over echo results. Pt has appt with [name], PA 9/13, will go over results at appt. I will fax results to PCP Dr. Arelia Sneddon. Results have also been released to [place] as well.

## 2018-05-04 ENCOUNTER — Encounter: Payer: Self-pay | Admitting: Physician Assistant

## 2018-05-04 ENCOUNTER — Ambulatory Visit: Payer: Medicare Other | Admitting: Physician Assistant

## 2018-05-04 VITALS — BP 120/60 | HR 67 | Ht 64.0 in | Wt 169.4 lb

## 2018-05-04 DIAGNOSIS — M79605 Pain in left leg: Secondary | ICD-10-CM

## 2018-05-04 DIAGNOSIS — I5032 Chronic diastolic (congestive) heart failure: Secondary | ICD-10-CM

## 2018-05-04 DIAGNOSIS — I10 Essential (primary) hypertension: Secondary | ICD-10-CM

## 2018-05-04 DIAGNOSIS — I2510 Atherosclerotic heart disease of native coronary artery without angina pectoris: Secondary | ICD-10-CM

## 2018-05-04 DIAGNOSIS — M79604 Pain in right leg: Secondary | ICD-10-CM

## 2018-05-04 DIAGNOSIS — Z720 Tobacco use: Secondary | ICD-10-CM

## 2018-05-04 LAB — BASIC METABOLIC PANEL
BUN / CREAT RATIO: 13 (ref 10–24)
BUN: 11 mg/dL (ref 8–27)
CALCIUM: 9.5 mg/dL (ref 8.6–10.2)
CHLORIDE: 91 mmol/L — AB (ref 96–106)
CO2: 28 mmol/L (ref 20–29)
Creatinine, Ser: 0.88 mg/dL (ref 0.76–1.27)
GFR, EST AFRICAN AMERICAN: 101 mL/min/{1.73_m2} (ref >59)
GFR, EST NON AFRICAN AMERICAN: 87 mL/min/{1.73_m2} (ref >59)
Glucose: 202 mg/dL — ABNORMAL HIGH (ref 65–99)
Potassium: 3.8 mmol/L (ref 3.5–5.2)
Sodium: 137 mmol/L (ref 134–144)
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
## 2018-05-04 NOTE — Patient Instructions (Signed)
Medication Instructions: Your physician recommends that you continue on your current medications as directed. Please refer to the Current Medication list given to you today.   Labwork: TODAY: BMET  Procedures/Testing: Your physician has requested that you have a lower extremity arterial exercise duplex. During this test, exercise and ultrasound are used to evaluate arterial blood flow in the legs. Allow one hour for this exam. There are no restrictions or special instructions.   Follow-Up: Your physician recommends that you schedule a follow-up appointment in: 4-6 weeks with Dr. Harrington Challenger or [name] PA-C   Any Additional Special Instructions Will Be Listed Below (If Applicable).  Please call Orthopaedic to follow up about your leg pain     If you need a refill on your cardiac medications before your next appointment, please call your pharmacy.

## 2018-05-07 ENCOUNTER — Other Ambulatory Visit: Payer: Self-pay | Admitting: Physician Assistant

## 2018-05-07 DIAGNOSIS — M79605 Pain in left leg: Principal | ICD-10-CM

## 2018-05-07 DIAGNOSIS — M79604 Pain in right leg: Secondary | ICD-10-CM

## 2018-05-11 ENCOUNTER — Encounter (HOSPITAL_COMMUNITY): Payer: Medicare Other

## 2018-05-24 ENCOUNTER — Ambulatory Visit (HOSPITAL_COMMUNITY): Payer: Medicare Other

## 2018-06-01 ENCOUNTER — Telehealth: Payer: Self-pay | Admitting: *Deleted

## 2018-06-01 ENCOUNTER — Ambulatory Visit (HOSPITAL_COMMUNITY)
Admission: RE | Admit: 2018-06-01 | Discharge: 2018-06-01 | Disposition: A | Discharge status: Home / Self Care | Payer: Medicare Other | Source: Ambulatory Visit | Attending: Cardiovascular Disease | Admitting: Cardiovascular Disease

## 2018-06-01 DIAGNOSIS — E1159 Type 2 diabetes mellitus with other circulatory complications: Secondary | ICD-10-CM

## 2018-06-01 DIAGNOSIS — R600 Localized edema: Secondary | ICD-10-CM

## 2018-06-01 DIAGNOSIS — M79604 Pain in right leg: Secondary | ICD-10-CM

## 2018-06-01 DIAGNOSIS — M79605 Pain in left leg: Secondary | ICD-10-CM

## 2018-06-01 NOTE — Telephone Encounter (Signed)
-----   Message from Burtis Junes, NP sent at 06/01/2018  4:24 PM EDT ----- Ok to report. Reviewed for Timothy Dopp, PA Doppler study concerning for ostial lesion of the right SFA. Vascular consult has been recommended - please arrange consult with Dr. Fletcher Anon Otherwise, see [name] back as planned and would continue on current regimen.

## 2018-06-01 NOTE — Telephone Encounter (Signed)
Left message to go over [place] results and recommendations.

## 2018-06-04 NOTE — Telephone Encounter (Signed)
-----   Message from Burtis Junes, NP sent at 06/01/2018  4:24 PM EDT ----- Ok to report. Reviewed for [name], PA Doppler study concerning for ostial lesion of the right SFA. Vascular consult has been recommended - please arrange consult with Dr. Fletcher Anon Otherwise, see Nicki Reaper back as planned and would continue on current regimen.

## 2018-06-04 NOTE — Telephone Encounter (Signed)
Pt has been notified of LEA results by phone with verbal understanding. Pt advised of referral to Dr. Fletcher Anon for consult. Pt is agreeable to plan of care and thanked me for the call. Pt aware [facility] will call him with an appt.

## 2018-06-05 ENCOUNTER — Ambulatory Visit: Payer: Medicare Other | Admitting: Cardiovascular Disease

## 2018-06-12 ENCOUNTER — Ambulatory Visit: Payer: Medicare Other | Admitting: Cardiovascular Disease

## 2018-06-12 ENCOUNTER — Encounter: Payer: Self-pay | Admitting: Cardiovascular Disease

## 2018-06-12 VITALS — BP 156/75 | HR 78 | Ht 64.0 in | Wt 171.2 lb

## 2018-06-12 DIAGNOSIS — E785 Hyperlipidemia, unspecified: Secondary | ICD-10-CM | POA: Diagnosis not present

## 2018-06-12 DIAGNOSIS — I739 Peripheral vascular disease, unspecified: Secondary | ICD-10-CM

## 2018-06-12 DIAGNOSIS — Z720 Tobacco use: Secondary | ICD-10-CM

## 2018-06-12 DIAGNOSIS — I2510 Atherosclerotic heart disease of native coronary artery without angina pectoris: Secondary | ICD-10-CM | POA: Diagnosis not present

## 2018-06-12 NOTE — Patient Instructions (Signed)
Your physician recommends that you continue on your current medications as directed. Please refer to the Current Medication list given to you today.    Your physician recommends that you schedule a follow-up appointment in:  AS NEEDED 
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
## 2018-06-13 ENCOUNTER — Encounter: Payer: Self-pay | Admitting: Physician Assistant

## 2018-06-13 ENCOUNTER — Ambulatory Visit: Payer: Medicare Other | Admitting: Physician Assistant

## 2018-06-13 VITALS — BP 132/72 | HR 77 | Ht 64.0 in | Wt 171.8 lb

## 2018-06-13 DIAGNOSIS — M25551 Pain in right hip: Secondary | ICD-10-CM | POA: Diagnosis not present

## 2018-06-13 DIAGNOSIS — I5032 Chronic diastolic (congestive) heart failure: Secondary | ICD-10-CM
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
## 2018-09-03 ENCOUNTER — Telehealth: Payer: Self-pay | Admitting: Physician Assistant

## 2018-09-03 NOTE — Telephone Encounter (Signed)
Pt reports having high BPs recently - last several days Makes him feel flushed and fatigued. Denies CP, SOB, vision changes. Had mild headache this am, none now. Has swelling which is stable.  Discussed sodium intake.  He reports he hates salt and never adds but eats out (fast food) some.  Says there has been a lot of family stress lately. On phone BP is 192/88, HR 68  Taking lisinopril 40 mg daily and metoprolol is succinate, 25 mg once a day.  Tartrate was listed on his med list but pt states it was changed to metoprolol succinate 24HR, 25 mg.  Med list updated.  He contacted PCP on Sunday, was told he could take Toprol 25 mg up to 3 times per day.  On Sun took 3 doses. Has taken 2 doses today so far and will take 3rd dose now.  Reports BPs are better in the evenings. Scheduled pt with APP on Wed.  He does not have transportation tomorrow.  Adv to go to ER for any neuro changes - headache, slurred speech, vision changes, weakness on one side. Adv to call cardiology on call if BP is high tonight for further recommendations.

## 2018-09-03 NOTE — Telephone Encounter (Signed)
° ° °  Pt c/o BP issue: STAT if pt c/o blurred vision, one-sided weakness or slurred speech  1. What are your last 5 BP readings? 156/76, 200/86, 151/71, 199/92  2. Are you having any other symptoms (ex. Dizziness, headache, blurred vision, passed out)? headache  3. What is your BP issue? BP too high

## 2018-09-04 NOTE — Progress Notes (Signed)
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 pharmacist 5. _____ Salt substitutes may be used freely by anyone on a sodium restricted diet 6. _____ Sodium is present in table salt, food additives and as a natural component of   most foods 7. _____ Table salt is approximately 90% sodium 8. _____ Limiting  sodium intake may help prevent excess fluid accumulation in the body 9. _____ On a sodium-restricted diet, seasonings such as bouillon soy sauce, and    cooking wine should be used in place of table salt 10. _____ On an ingredient list, a product which lists monosodium glutamate as the first   ingredient is an appropriate food to include on a low sodium diet  Circle the best answer(s) to the following statements (Hint: there may be more than one correct answer)  11. On a low-sodium diet, some acceptable snack items are:    A. Olives  F. Bean dip   K. Grapefruit juice    B. Salted Pretzels G. Commercial Popcorn   L. Canned peaches    C. Carrot Sticks  H. Bouillon   M. Unsalted nuts   D. [place] fries  I. Peanut  butter crackers N. Salami   E. Sweet pickles J. Tomato Juice   O. Pizza  12.  Seasonings that may be used freely on a reduced - sodium diet include   A. Lemon wedges F.Monosodium glutamate K. Celery seed    B.Soysauce   G. Pepper   L. Mustard powder   C. Sea salt  H. Cooking wine  M. Onion flakes   D. Vinegar  E. Prepared horseradish N. Salsa   E. Sage   J. Worcestershire sauce  O. [address]       Sumner Boast, PA-C  09/05/2018 11:54 AM    [facility] [place], [place], [place]  [address] Phone: [phone]; Fax: [phone]

## 2018-09-05 ENCOUNTER — Ambulatory Visit (INDEPENDENT_AMBULATORY_CARE_PROVIDER_SITE_OTHER): Payer: Medicare Other | Admitting: Physician Assistant

## 2018-09-05 ENCOUNTER — Telehealth: Payer: Self-pay | Admitting: Physician Assistant

## 2018-09-05 ENCOUNTER — Encounter: Payer: Self-pay | Admitting: Physician Assistant

## 2018-09-05 VITALS — BP 180/90 | HR 80 | Ht 61.0 in | Wt 168.0 lb

## 2018-09-05 DIAGNOSIS — I5032 Chronic diastolic (congestive) heart failure: Secondary | ICD-10-CM

## 2018-09-05 DIAGNOSIS — I10 Essential (primary) hypertension: Secondary | ICD-10-CM | POA: Diagnosis not present

## 2018-09-05 DIAGNOSIS — I2510 Atherosclerotic heart disease of native coronary artery without angina pectoris: Secondary | ICD-10-CM | POA: Diagnosis not present

## 2018-09-05 DIAGNOSIS — F17200 Nicotine dependence, unspecified, uncomplicated: Secondary | ICD-10-CM

## 2018-09-05 DIAGNOSIS — E7800 Pure hypercholesterolemia, unspecified: Secondary | ICD-10-CM | POA: Diagnosis not present

## 2018-09-05 MED ORDER — METOPROLOL SUCCINATE ER 50 MG PO TB24: 50.0000 mg | ORAL_TABLET | Freq: Every day | ORAL | 3 refills | Status: DC

## 2018-09-05 MED ORDER — CHLORTHALIDONE 25 MG PO TABS: 25.0000 mg | ORAL_TABLET | Freq: Every day | ORAL | 3 refills | Status: AC

## 2018-09-05 NOTE — Patient Instructions (Signed)
Medication Instructions:  1.) START:Chlorthalidone 25 mg daily (you make take an extra pill if your BP spikes to 160/90)  If you need a refill on your cardiac medications before your next appointment, please call your pharmacy.   Lab work: TODAY: BMET & BNP  If you have labs (blood work) drawn today and your tests are completely normal, you will receive your results only by: Marland Kitchen MyChart Message (if you have MyChart) OR . A paper copy in the mail If you have any lab test that is abnormal or we need to change your treatment, we will call you to review the results.  Testing/Procedures: None  Follow-Up: At [facility], you and your health needs are our priority.  As part of our continuing mission to provide you with exceptional heart care, we have created designated Provider Care Teams.  These Care Teams include your primary Cardiologist (physician) and Advanced Practice Providers (APPs -  Physician Assistants and Nurse Practitioners) who all work together to provide you with the care you need, when you need it. You will need a follow up appointment in:  2-3 weeks.  Please call our office 2 months in advance to schedule this appointment.  You may see Dorris Carnes, MD or one of the following Advanced Practice Providers on your designated Care Team: [name], PA-C [place], [place] . Daune Perch, NP  Any Other Special Instructions Will Be Listed Below (If Applicable). Two Gram Sodium Diet 2000 mg  What is Sodium? Sodium is a mineral found naturally in many foods. The most significant source of sodium in the diet is table salt, which is about 40% sodium.  Processed, convenience, and preserved foods also contain a large amount of sodium.  The body needs only 500 mg of sodium daily to function,  A normal diet provides more than enough sodium even if you do not use salt.  Why Limit Sodium? A build up of sodium in the body can cause thirst, increased blood pressure, shortness of breath, and  water retention.  Decreasing sodium in the diet can reduce edema and risk of heart attack or stroke associated with high blood pressure.  Keep in mind that there are many other factors involved in these health problems.  Heredity, obesity, lack of exercise, cigarette smoking, stress and what you eat all play a role.  General Guidelines:  Do not add salt at the table or in cooking.  One teaspoon of salt contains over 2 grams of sodium.  Read food labels  Avoid processed and convenience foods  Ask your dietitian before eating any foods not dicussed in the menu planning guidelines  Consult your physician if you wish to use a salt substitute or a sodium containing medication such as antacids.  Limit milk and milk products to 16 oz (2 cups) per day.  Shopping Hints:  READ LABELS!! "Dietetic" does not necessarily mean low sodium.  Salt and other sodium ingredients are often added to foods during processing.   Menu Planning Guidelines Food Group Choose More Often Avoid  Beverages (see also the milk group All fruit juices, low-sodium, salt-free vegetables juices, low-sodium carbonated beverages Regular vegetable or tomato juices, commercially softened water used for drinking or cooking  Breads and Cereals Enriched white, wheat, rye and pumpernickel bread, hard rolls and dinner rolls; muffins, cornbread and waffles; most dry cereals, cooked cereal without added salt; unsalted crackers and breadsticks; low sodium or homemade bread crumbs Bread, rolls and crackers with salted tops; quick breads; instant hot  cereals; pancakes; commercial bread stuffing; self-rising flower and biscuit mixes; regular bread crumbs or cracker crumbs  Desserts and Sweets Desserts and sweets mad with mild should be within allowance Instant pudding mixes and cake mixes  Fats Butter or margarine; vegetable oils; unsalted salad dressings, regular salad dressings limited to 1 Tbs; light, sour and heavy cream Regular salad  dressings containing bacon fat, bacon bits, and salt pork; snack dips made with instant soup mixes or processed cheese; salted nuts  Fruits Most fresh, frozen and canned fruits Fruits processed with salt or sodium-containing ingredient (some dried fruits are processed with sodium sulfites        Vegetables Fresh, frozen vegetables and low- sodium canned vegetables Regular canned vegetables, sauerkraut, pickled vegetables, and others prepared in brine; frozen vegetables in sauces; vegetables seasoned with ham, bacon or salt pork  Condiments, Sauces, Miscellaneous  Salt substitute with physician's approval; pepper, herbs, spices; vinegar, lemon or lime juice; hot pepper sauce; garlic powder, onion powder, low sodium soy sauce (1 Tbs.); low sodium condiments (ketchup, chili sauce, mustard) in limited amounts (1 tsp.) fresh ground horseradish; unsalted tortilla chips, pretzels, potato chips, popcorn, salsa (1/4 cup) Any seasoning made with salt including garlic salt, celery salt, onion salt, and seasoned salt; sea salt, rock salt, kosher salt; meat tenderizers; monosodium glutamate; mustard, regular soy sauce, barbecue, sauce, chili sauce, teriyaki sauce, steak sauce, Worcestershire sauce, and most flavored vinegars; canned gravy and mixes; regular condiments; salted snack foods, olives, picles, relish, horseradish sauce, catsup   Food preparation: Try these seasonings Meats:    Pork Sage, onion Serve with applesauce  Chicken Poultry seasoning, thyme, parsley Serve with cranberry sauce  Lamb Curry powder, rosemary, garlic, thyme Serve with mint sauce or jelly  Veal Marjoram, basil Serve with current jelly, cranberry sauce  Beef Pepper, bay leaf Serve with dry mustard, unsalted chive butter  Fish Bay leaf, dill Serve with unsalted lemon butter, unsalted parsley butter  Vegetables:    Asparagus Lemon juice   Broccoli Lemon juice   Carrots Mustard dressing parsley, mint, nutmeg, glazed with unsalted  butter and sugar   Green beans Marjoram, lemon juice, nutmeg,dill seed   Tomatoes Basil, marjoram, onion   Spice /blend for [employer]" 4 tsp ground thyme 1 tsp ground sage 3 tsp ground rosemary 4 tsp ground marjoram   Test your knowledge 1. A product that says "Salt Free" may still contain sodium. True or False 2. Garlic Powder and Hot Pepper Sauce an be used as alternative seasonings.True or False 3. Processed foods have more sodium than fresh foods.  True or False 4. Canned Vegetables have less sodium than froze True or False  WAYS TO DECREASE YOUR SODIUM INTAKE 1. Avoid the use of added salt in cooking and at the table.  Table salt (and other prepared seasonings which contain salt) is probably one of the greatest sources of sodium in the diet.  Unsalted foods can gain flavor from the sweet, sour, and butter taste sensations of herbs and spices.  Instead of using salt for seasoning, try the following seasonings with the foods listed.  Remember: how you use them to enhance natural food flavors is limited only by your creativity... Allspice-Meat, fish, eggs, fruit, peas, red and yellow vegetables Almond Extract-Fruit baked goods Anise Seed-Sweet breads, fruit, carrots, beets, cottage cheese, cookies (tastes like licorice) Basil-Meat, fish, eggs, vegetables, rice, vegetables salads, soups, sauces Bay Leaf-Meat, fish, stews, poultry Burnet-Salad, vegetables (cucumber-like flavor) Caraway Seed-Bread, cookies, cottage cheese, meat, vegetables, cheese,  rice Cardamon-Baked goods, fruit, soups Celery Powder or seed-Salads, salad dressings, sauces, meatloaf, soup, bread.Do not use  celery salt Chervil-Meats, salads, fish, eggs, vegetables, cottage cheese (parsley-like flavor) Chili Power-Meatloaf, chicken cheese, corn, eggplant, egg dishes Chives-Salads cottage cheese, egg dishes, soups, vegetables, sauces Cilantro-Salsa, casseroles Cinnamon-Baked goods, fruit, pork, lamb, chicken,  carrots Cloves-Fruit, baked goods, fish, pot roast, green beans, beets, carrots Coriander-Pastry, cookies, meat, salads, cheese (lemon-orange flavor) Cumin-Meatloaf, fish,cheese, eggs, cabbage,fruit pie (caraway flavor) [employer], fruit, eggs, fish, poultry, cottage cheese, vegetables Dill Seed-Meat, cottage cheese, poultry, vegetables, fish, salads, bread Fennel Seed-Bread, cookies, apples, pork, eggs, fish, beets, cabbage, cheese, Licorice-like flavor Garlic-(buds or powder) Salads, meat, poultry, fish, bread, butter, vegetables, potatoes.Do not  use garlic salt Ginger-Fruit, vegetables, baked goods, meat, fish, poultry Horseradish Root-Meet, vegetables, butter Lemon Juice or Extract-Vegetables, fruit, tea, baked goods, fish salads Mace-Baked goods fruit, vegetables, fish, poultry (taste like nutmeg) Maple Extract-Syrups Marjoram-Meat, chicken, fish, vegetables, breads, green salads (taste like Sage) Mint-Tea, lamb, sherbet, vegetables, desserts, carrots, cabbage Mustard, Dry or Seed-Cheese, eggs, meats, vegetables, poultry Nutmeg-Baked goods, fruit, chicken, eggs, vegetables, desserts Onion Powder-Meat, fish, poultry, vegetables, cheese, eggs, bread, rice salads (Do not use   Onion salt) Orange Extract-Desserts, baked goods Oregano-Pasta, eggs, cheese, onions, pork, lamb, fish, chicken, vegetables, green salads Paprika-Meat, fish, poultry, eggs, cheese, vegetables Parsley Flakes-Butter, vegetables, meat fish, poultry, eggs, bread, salads (certain forms may   Contain sodium Pepper-Meat fish, poultry, vegetables, eggs Peppermint Extract-Desserts, baked goods Poppy Seed-Eggs, bread, cheese, fruit dressings, baked goods, noodles, vegetables, cottage  [employer], poultry, meat, fish, cauliflower, turnips,eggs bread Saffron-Rice, bread, veal, chicken, fish, eggs Sage-Meat, fish, poultry, onions, eggplant, tomateos, pork, stews Savory-Eggs,  salads, poultry, meat, rice, vegetables, soups, pork Tarragon-Meat, poultry, fish, eggs, butter, vegetables (licorice-like flavor)  Thyme-Meat, poultry, fish, eggs, vegetables, (clover-like flavor), sauces, soups Tumeric-Salads, butter, eggs, fish, rice, vegetables (saffron-like flavor) Vanilla Extract-Baked goods, candy Vinegar-Salads, vegetables, meat marinades Walnut Extract-baked goods, candy  2. Choose your Foods Wisely   The following is a list of foods to avoid which are high in sodium:  Meats-Avoid all smoked, canned, salt cured, dried and kosher meat and fish as well as Anchovies   [employer] meats:Bologna, Liverwurst, Pastrami Canned meat or fish  Marinated herring Caviar    Pepperoni Corned Beef   Pizza Dried chipped beef  Salami Frozen breaded fish or meat Salt pork Frankfurters or hot dogs  Sardines Gefilte fish   Sausage Ham (boiled ham, Proscuitto Smoked butt    spiced ham)   Spam      TV Dinners Vegetables Canned vegetables (Regular) Relish Canned mushrooms  Sauerkraut Olives    Tomato juice Pickles  Bakery and Dessert Products Canned puddings  Cream pies Cheesecake   Decorated cakes Cookies  Beverages/Juices Tomato juice, regular  Gatorade   V-8 vegetable juice, regular  Breads and Cereals Biscuit mixes   Salted potato chips, corn chips, pretzels Bread stuffing mixes  Salted crackers and rolls Pancake and waffle mixes Self-rising flour  Seasonings Accent    Meat sauces Barbecue sauce  Meat tenderizer Catsup    Monosodium glutamate (MSG) Celery salt   Onion salt Chili sauce   Prepared mustard Garlic salt   Salt, seasoned salt, sea salt Gravy mixes   Soy sauce Horseradish   Steak sauce Ketchup   Tartar sauce Lite salt    Teriyaki sauce Marinade mixes   Worcestershire sauce  Others Baking powder   Cocoa and cocoa mixes Baking  soda   Commercial casserole mixes Candy-caramels, chocolate  Dehydrated soups    Bars,  fudge,nougats  Instant rice and pasta mixes Canned broth or soup  Maraschino cherries Cheese, aged and processed cheese and cheese spreads  Learning Assessment Quiz  Indicated T (for True) or F (for False) for each of the following statements:  1. _____ Fresh fruits and vegetables and unprocessed grains are generally low in sodium 2. _____ Water may contain a considerable amount of sodium, depending on the source 3. _____ You can always tell if a food is high in sodium by tasting it 4. _____ Certain laxatives my be high in sodium and should be avoided unless prescribed   by a physician or pharmacist 5. _____ Salt substitutes may be used freely by anyone on a sodium restricted diet 6. _____ Sodium is present in table salt, food additives and as a natural component of   most foods 7. _____ Table salt is approximately 90% sodium 8. _____ Limiting sodium intake may help prevent excess fluid accumulation in the body 9. _____ On a sodium-restricted diet, seasonings such as bouillon soy sauce, and    cooking wine should be used in place of table salt 10. _____ On an ingredient list, a product which lists monosodium glutamate as the first   ingredient is an appropriate food to include on a low sodium diet  Circle the best answer(s) to the following statements (Hint: there may be more than one correct answer)  11. On a low-sodium diet, some acceptable snack items are:    A. Olives  F. Bean dip   K. Grapefruit juice    B. Salted Pretzels G. Commercial Popcorn   L. Canned peaches    C. Carrot Sticks  H. Bouillon   M. Unsalted nuts   D. [place] fries  I. Peanut butter crackers N. Salami   E. Sweet pickles J. Tomato Juice   O. Pizza  12.  Seasonings that may be used freely on a reduced - sodium diet include   A. Lemon wedges F.Monosodium glutamate K. Celery seed    B.Soysauce   G. Pepper   L. Mustard powder   C. Sea salt  H. Cooking wine  M. Onion flakes   D. Vinegar  E. Prepared  horseradish N. Salsa   E. Sage   J. Worcestershire sauce  O. Chutney

## 2018-09-05 NOTE — Telephone Encounter (Signed)
Please have the patient increase his Toprol-XL to 50 mg once daily.

## 2018-09-05 NOTE — Telephone Encounter (Signed)
Called and spoke to the patient's daughter and made her aware that Timothy Barrios, PA would like for him to increase his Toprol to 50 mg QD. Instructed for patient to continue to monitor BP and he may take an extra chlorthalidone if his BP is greater than 160/90. Daughter verbalized understanding and thanked me for the call. Rx sent to preferred pharmacy.

## 2018-09-05 NOTE — Telephone Encounter (Signed)
Returned call to patient's daughter. She states that the patient is taking metoprolol succinate (Toprol) 25 mg tablet QD. She states that the patient's PCP told the patient that he can take extra metoprolol for BP spikes (up to 3 tablets in one day). She states that the patient was seen by [name], PA today and stated that she was instructed to call back and report dose and that she may change the dose based on this information. Made daughter aware that I would let Selinda Eon know and call back with additional recommendations.

## 2018-09-05 NOTE — Telephone Encounter (Signed)
New Message   Pt c/o medication issue:  1. Name of Medication: Metoprolol  2. How are you currently taking this medication (dosage and times per day)? 25mg  1 tablet daily   3. Are you having a reaction (difficulty breathing--STAT)? No  4. What is your medication issue? Pts daughter says pt was seen today and PA was questioning what mg strength was the medication.    Pharmacy on [address]

## 2018-09-06 ENCOUNTER — Other Ambulatory Visit: Payer: Self-pay | Admitting: Physician Assistant

## 2018-09-06 LAB — BASIC METABOLIC PANEL
BUN / CREAT RATIO: 12 (ref 10–24)
BUN: 9 mg/dL (ref 8–27)
CHLORIDE: 101 mmol/L (ref 96–106)
CO2: 23 mmol/L (ref 20–29)
Calcium: 9 mg/dL (ref 8.6–10.2)
Creatinine, Ser: 0.74 mg/dL — ABNORMAL LOW (ref 0.76–1.27)
GFR calc non Af Amer: 93 mL/min/{1.73_m2} (ref >59)
GFR, EST AFRICAN AMERICAN: 108 mL/min/{1.73_m2} (ref >59)
Glucose: 148 mg/dL — ABNORMAL HIGH (ref 65–99)
Potassium: 4.2 mmol/L (ref 3.5–5.2)
SODIUM: 139 mmol/L (ref 134–144)

## 2018-09-06 LAB — PRO B NATRIURETIC PEPTIDE: NT-Pro BNP: 114 pg/mL (ref 0–376)

## 2018-09-26 ENCOUNTER — Ambulatory Visit: Payer: Medicare Other | Admitting: Physician Assistant

## 2018-10-11 ENCOUNTER — Other Ambulatory Visit: Payer: Self-pay | Admitting: Physician Assistant

## 2018-12-28 ENCOUNTER — Other Ambulatory Visit: Payer: Self-pay | Admitting: Orthopedic Surgery

## 2018-12-28 DIAGNOSIS — S7224XD Nondisplaced subtrochanteric fracture of right femur, subsequent encounter for closed fracture with routine healing: Secondary | ICD-10-CM

## 2019-01-03 ENCOUNTER — Other Ambulatory Visit: Payer: Medicare Other

## 2019-01-11 ENCOUNTER — Other Ambulatory Visit: Payer: Medicare Other

## 2019-01-25 ENCOUNTER — Ambulatory Visit
Admission: RE | Admit: 2019-01-25 | Discharge: 2019-01-25 | Disposition: A | Discharge status: Home / Self Care | Payer: Medicare Other | Source: Ambulatory Visit | Attending: Orthopedic Surgery | Admitting: Orthopedic Surgery

## 2019-01-25 ENCOUNTER — Other Ambulatory Visit: Payer: Medicare Other

## 2019-01-25 DIAGNOSIS — S7224XD Nondisplaced subtrochanteric fracture of right femur, subsequent encounter for closed fracture with routine healing: Secondary | ICD-10-CM

## 2019-02-12 ENCOUNTER — Telehealth: Payer: Self-pay | Admitting: *Deleted

## 2019-02-12 NOTE — Telephone Encounter (Signed)
Pt last had a myovue in 2017 WIth planned surgery and hx of CAD I would recomm a lexiscan myovue to r/o inducible ischemia

## 2019-02-12 NOTE — Telephone Encounter (Signed)
Call placed to pt re: surgical clearance and the need for an appt before clearance can be given. Left a detailed message for pt to call back to make an appt.

## 2019-02-12 NOTE — Telephone Encounter (Signed)
   [facility] Pre-operative Risk Assessment    Request for surgical clearance:  1. What type of surgery is being performed? COVERSION OF HIP SX TO RT TOTAL HIP ARTHROPLASTY  2. When is this surgery scheduled? 04/03/19  3. What type of clearance is required (medical clearance vs. Pharmacy clearance to hold med vs. Both)? MEDICAL  4. Are there any medications that need to be held prior to surgery and how long? ASA  5. Practice name and name of physician performing surgery? [facility]; DR. Wynelle Link  6. What is your office phone number [phone]   7.   What is your office fax number [phone]  8.   Anesthesia type (None, local, MAC, general) ? CHOICE   [name] 02/12/2019, 2:36 PM  _________________________________________________________________   (provider comments below)

## 2019-02-12 NOTE — Telephone Encounter (Signed)
   Primary Cardiologist:Paula Harrington Challenger, MD  Chart reviewed as part of pre-operative protocol coverage. Because of [name]'s past medical history and time since last visit, he/she will require a follow-up visit in order to better assess preoperative cardiovascular risk. Patient has h/o CAD s/p PCI, hypertension, HLD, DM, tobacco abuse, chronic diastolic CHF, PVD. Last seen 08/2018 with feeling flushed and fatigued. He was started on chlorthalidone with recommendation to return in 2-3 weeks. He also had increase in BB as well. He has not returned for repeat visit (thus has also not had repeat labwork since starting chlorthalidone.)   Anticipate needing f/u labwork especially since he is on both torsemide and chlorthalidone so please arrange in-person visit for clearance with Dr. Lula Olszewski team. Pt has seen [name] and [name].  Pre-op covering staff: - Please schedule appointment and call patient to inform them. - Please contact requesting surgeon's office via preferred method (i.e, phone, fax) to inform them of need for appointment prior to surgery.  If applicable, this message will also be routed to primary cardiologist for input on holding ASA as requested below so that this information is available at time of patient's appointment. Dr Harrington Challenger - - Please advise if OK to hold ASA for hip surgery and route response to P [facility] PREOP (the pre-op pool). Thank you.   [name], PA-C  02/12/2019, 2:53 PM

## 2019-02-13 ENCOUNTER — Other Ambulatory Visit: Payer: Self-pay

## 2019-02-13 DIAGNOSIS — I2510 Atherosclerotic heart disease of native coronary artery without angina pectoris: Secondary | ICD-10-CM

## 2019-02-13 NOTE — Telephone Encounter (Signed)
I spoke to Mr. Heinle daughter who manage his appointment. She is aware of the need for stress test and office visit afterward. Will forward message to our staff to arrange.   Note, patient's daughter request all scheduling to be done through her (phone # [phone]).

## 2019-02-13 NOTE — Telephone Encounter (Signed)
No message needed °

## 2019-02-13 NOTE — Telephone Encounter (Signed)
Left message for the patient to call back. Dr. Harrington Challenger recommended a lexiscan stress test prior to surgery, he will also need a office visit after the stress test for final clearance as well.

## 2019-02-13 NOTE — Progress Notes (Signed)
Order verbally given

## 2019-02-13 NOTE — Telephone Encounter (Signed)
Routing to pre op pool

## 2019-02-14 NOTE — Telephone Encounter (Addendum)
Ordered placed for Lexiscan. Sent to scheduling pool to schedule f/u with Dr. Harrington Challenger after Lexi.

## 2019-02-21 ENCOUNTER — Telehealth (HOSPITAL_COMMUNITY): Payer: Self-pay | Admitting: *Deleted

## 2019-02-21 NOTE — Telephone Encounter (Signed)
Left message on voicemail per DPR in reference to upcoming appointment scheduled on 02/27/19 at 11:00 with detailed instructions given per Myocardial Perfusion Study Information Sheet for the test. LM to arrive 15 minutes early, and that it is imperative to arrive on time for appointment to keep from having the test rescheduled. If you need to cancel or reschedule your appointment, please call the office within 24 hours of your appointment. Failure to do so may result in a cancellation of your appointment, and a $50 no show fee. Phone number given for call back for any questions.

## 2019-02-26 ENCOUNTER — Telehealth: Payer: Self-pay | Admitting: Physician Assistant

## 2019-02-26 NOTE — Telephone Encounter (Signed)
New Message             Patient is returning Natasha's call and would like a call back.

## 2019-02-26 NOTE — Telephone Encounter (Signed)
Returned call to Pt.  Pt with some concerns regarding upcoming appts and possible delay for his surgery.  Advised Pt that if his stress test comes back high risk tomorrow our office would move quickly into the next phase of his testing.  Reassured that Pt should be able to be cleared prior to his surgery in August.  Pt thanked nurse for call.  He was reassured.

## 2019-02-27 ENCOUNTER — Other Ambulatory Visit: Payer: Self-pay

## 2019-02-27 ENCOUNTER — Ambulatory Visit (HOSPITAL_COMMUNITY): Payer: Medicare Other | Attending: Cardiovascular Disease

## 2019-02-27 ENCOUNTER — Encounter (HOSPITAL_COMMUNITY): Payer: Self-pay

## 2019-02-27 DIAGNOSIS — I2510 Atherosclerotic heart disease of native coronary artery without angina pectoris: Secondary | ICD-10-CM

## 2019-02-27 LAB — MYOCARDIAL PERFUSION IMAGING
LV dias vol: 101 mL (ref 62–150)
LV sys vol: 37 mL
Peak HR: 71 {beats}/min
Rest HR: 50 {beats}/min
SDS: 1
SRS: 1
SSS: 2
TID: 1.08

## 2019-02-27 MED ORDER — TECHNETIUM TC 99M TETROFOSMIN IV KIT
30.4000 | PACK | Freq: Once | INTRAVENOUS | Status: AC | PRN
  Administered 2019-02-27: 30.4 via INTRAVENOUS
  Filled 2019-02-27: qty 31

## 2019-02-27 MED ORDER — REGADENOSON 0.4 MG/5ML IV SOLN
0.4000 mg | Freq: Once | INTRAVENOUS | Status: AC
  Administered 2019-02-27: 0.4 mg via INTRAVENOUS

## 2019-02-27 MED ORDER — TECHNETIUM TC 99M TETROFOSMIN IV KIT
10.2000 | PACK | Freq: Once | INTRAVENOUS | Status: AC | PRN
  Administered 2019-02-27: 10.2 via INTRAVENOUS
  Filled 2019-02-27: qty 11

## 2019-02-28 NOTE — Progress Notes (Signed)
The patient has been notified of the result and verbalized understanding.  All questions (if any) were answered. [name], [place] 02/28/2019 5:32 PM

## 2019-03-04 ENCOUNTER — Telehealth: Payer: Self-pay | Admitting: *Deleted

## 2019-03-04 NOTE — Telephone Encounter (Signed)
Call placed to pt re: appt 03/06/2019.  Need to change to in-office and ask the covid19 prescreen questions. Left a message for him to call back.

## 2019-03-05 ENCOUNTER — Telehealth: Payer: Self-pay | Admitting: Physician Assistant

## 2019-03-05 NOTE — Telephone Encounter (Signed)
New Message ° ° ° °Left message to confirm appt and answer covid questions  °

## 2019-03-06 ENCOUNTER — Ambulatory Visit: Payer: Medicare Other | Admitting: Physician Assistant

## 2019-03-06 ENCOUNTER — Telehealth: Payer: Self-pay | Admitting: Physician Assistant

## 2019-03-06 NOTE — Progress Notes (Deleted)
error 

## 2019-03-06 NOTE — Telephone Encounter (Signed)
Returned pts call and he needed to cancel appt for today as him nor his daughter could make it. Reschedule him to 03/14/2019 at 1:15.

## 2019-03-06 NOTE — Telephone Encounter (Signed)
New message:   Patient daughter calling concering his appt today and state patient is in a wheel chair and can not come in. Please call patient back.

## 2019-03-14 ENCOUNTER — Telehealth: Payer: Self-pay | Admitting: Physician Assistant

## 2019-03-14 ENCOUNTER — Ambulatory Visit: Payer: Medicare Other | Admitting: Physician Assistant

## 2019-03-14 ENCOUNTER — Encounter: Payer: Self-pay | Admitting: Physician Assistant

## 2019-03-14 ENCOUNTER — Other Ambulatory Visit: Payer: Self-pay

## 2019-03-14 VITALS — BP 142/60 | HR 63 | Ht 64.0 in | Wt 167.4 lb

## 2019-03-14 DIAGNOSIS — I2510 Atherosclerotic heart disease of native coronary artery without angina pectoris: Secondary | ICD-10-CM

## 2019-03-14 DIAGNOSIS — I5032 Chronic diastolic (congestive) heart failure: Secondary | ICD-10-CM

## 2019-03-14 DIAGNOSIS — E785 Hyperlipidemia, unspecified: Secondary | ICD-10-CM

## 2019-03-14 DIAGNOSIS — I10 Essential (primary) hypertension: Secondary | ICD-10-CM

## 2019-03-14 DIAGNOSIS — Z720 Tobacco use: Secondary | ICD-10-CM

## 2019-03-14 DIAGNOSIS — I739 Peripheral vascular disease, unspecified: Secondary | ICD-10-CM

## 2019-03-14 NOTE — Patient Instructions (Signed)
Medication Instructions:  Your physician recommends that you continue on your current medications as directed. Please refer to the Current Medication list given to you today.  If you need a refill on your cardiac medications before your next appointment, please call your pharmacy.   Lab work: None ordered  If you have labs (blood work) drawn today and your tests are completely normal, you will receive your results only by: . MyChart Message (if you have MyChart) OR . A paper copy in the mail If you have any lab test that is abnormal or we need to change your treatment, we will call you to review the results.  Testing/Procedures: None ordered   Follow-Up: At [facility], you and your health needs are our priority.  As part of our continuing mission to provide you with exceptional heart care, we have created designated Provider Care Teams.  These Care Teams include your primary Cardiologist (physician) and Advanced Practice Providers (APPs -  Physician Assistants and Nurse Practitioners) who all work together to provide you with the care you need, when you need it. You will need a follow up appointment in:  6 months.  Please call our office 2 months in advance to schedule this appointment.  You may see Paula Ross, MD or one of the following Advanced Practice Providers on your designated Care Team: Scott Weaver, PA-C Vin Bhagat, PA-C . Janine Hammond, NP  Any Other Special Instructions Will Be Listed Below (If Applicable).    

## 2019-03-14 NOTE — Telephone Encounter (Signed)
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
## 2019-03-27 NOTE — Patient Instructions (Addendum)
YOU NEED TO HAVE A COVID 19 TEST ON Saturday, Aug. 8, 2020  @ 11:55AM, THIS TEST MUST BE DONE BEFORE SURGERY, COME  [place], [place] , [address], ENTER IN PRE SURGICAL TESTING LINE.Marland Kitchen ONCE YOUR COVID TEST IS COMPLETED, PLEASE BEGIN THE QUARANTINE INSTRUCTIONS AS OUTLINED IN YOUR HANDOUT.                [name]  03/27/2019   Your procedure is scheduled on: 04-03-19    Report to [facility] Main  Entrance    Report to Admitting at 10:40 AM    1 VISITOR IS ALLOWED TO WAIT IN WAITING ROOM  ONLY DURING SURGERY.  THEN IN YOUR ROOM DURING VISITORS HOURS ONLY.   Call this number if you have problems the morning of surgery [phone]    Remember: NO SOLID FOOD AFTER MIDNIGHT THE NIGHT PRIOR TO SURGERY.   MAY HAVE LIQUIDS UNTIL 10:10AM DAY OF SURGERY   CLEAR LIQUID DIET  Foods Allowed                                                                     Foods Excluded  Water, Black Coffee and tea, regular and decaf                             liquids that you cannot  Plain Jell-O in any flavor  (No red)                                           see through such as: Fruit ices (not with fruit pulp)                                     milk, soups, orange juice  Iced Popsicles (No red)                                    All solid food Carbonated beverages, regular and diet                                    Apple juices Sports drinks like Gatorade (No red) Lightly seasoned clear broth or consume(fat free) Sugar, honey syrup  Sample Menu Breakfast                                Lunch                                     Supper Cranberry juice                    Beef broth  Chicken broth Jell-O                                     Grape juice                           Apple juice Coffee or tea                        Jell-O                                      Popsicle                                                Coffee or tea                         Coffee or tea   DRINK G2 AT 10:10AM DAY OF SURGERY   Take these medicines the morning of surgery with A SIP OF WATER: Gabapentin (Neurontin), Metoprolol Succinate, Atorvastatin, Buspirone, Oxycodone HCL   Bring Asthma Inhaler day of surgery   BRUSH YOUR TEETH MORNING OF SURGERY AND RINSE YOUR MOUTH OUT, NO CHEWING GUM CANDY OR MINTS.   DO NOT TAKE ANY DIABETIC MEDICATIONS DAY OF YOUR SURGERY                               You may not have any metal on your body including hair pins and              piercings     Do not wear jewelry, cologne,  lotions, powders or deodorant                      Men may shave face and neck.   Do not bring valuables to the hospital. [place].  Contacts, dentures or bridgework may not be worn into surgery.    Special Instructions: [name]              Please read over the following fact sheets you were given: _____________________________________________________________________  How to Manage Your Diabetes Before and After Surgery  Why is it important to control my blood sugar before and after surgery? . Improving blood sugar levels before and after surgery helps healing and can limit problems. . A way of improving blood sugar control is eating a healthy diet by: o  Eating less sugar and carbohydrates o  Increasing activity/exercise o  Talking with your doctor about reaching your blood sugar goals . High blood sugars (greater than 180 mg/dL) can raise your risk of infections and slow your recovery, so you will need to focus on controlling your diabetes during the weeks before surgery. . Make sure that the doctor who takes care of your diabetes knows about your planned surgery including the date and location.  How do I manage my blood sugar before surgery? . Check your blood sugar at least 4 times  a day, starting 2 days before surgery, to make sure that the level is not too high or  low. o Check your blood sugar the morning of your surgery when you wake up and every 2 hours until you get to the Short Stay unit. . If your blood sugar is less than 70 mg/dL, you will need to treat for low blood sugar: o Do not take insulin. o Treat a low blood sugar (less than 70 mg/dL) with  cup of clear juice (cranberry or apple), 4 glucose tablets, OR glucose gel. o Recheck blood sugar in 15 minutes after treatment (to make sure it is greater than 70 mg/dL). If your blood sugar is not greater than 70 mg/dL on recheck, call [phone] for further instructions. . Report your blood sugar to the short stay nurse when you get to Short Stay.  . If you are admitted to the hospital after surgery: o Your blood sugar will be checked by the staff and you will probably be given insulin after surgery (instead of oral diabetes medicines) to make sure you have good blood sugar levels. o The goal for blood sugar control after surgery is 80-180 mg/dL.   WHAT DO I DO ABOUT MY DIABETES MEDICATION?  Marland Kitchen Do not take oral diabetes medicines (pills) the morning of surgery.  . THE DAY BEFORE SURGERY, take your usual Metformin                 [facility] - Preparing for Surgery Before surgery, you can play an important role.  Because skin is not sterile, your skin needs to be as free of germs as possible.  You can reduce the number of germs on your skin by washing with CHG (chlorahexidine gluconate) soap before surgery.  CHG is an antiseptic cleaner which kills germs and bonds with the skin to continue killing germs even after washing. Please DO NOT use if you have an allergy to CHG or antibacterial soaps.  If your skin becomes reddened/irritated stop using the CHG and inform your nurse when you arrive at Short Stay. Do not shave (including legs and underarms) for at least 48 hours prior to the first CHG shower.  You may shave your face/neck. Please follow these instructions carefully:  1.  Shower with CHG  Soap the night before surgery and the  morning of Surgery.  2.  If you choose to wash your hair, wash your hair first as usual with your  normal  shampoo.  3.  After you shampoo, rinse your hair and body thoroughly to remove the  shampoo.                           4.  Use CHG as you would any other liquid soap.  You can apply chg directly  to the skin and wash                       Gently with a scrungie or clean washcloth.  5.  Apply the CHG Soap to your body ONLY FROM THE NECK DOWN.   Do not use on face/ open                           Wound or open sores. Avoid contact with eyes, ears mouth and genitals (private parts).  Wash face,  Genitals (private parts) with your normal soap.             6.  Wash thoroughly, paying special attention to the area where your surgery  will be performed.  7.  Thoroughly rinse your body with warm water from the neck down.  8.  DO NOT shower/wash with your normal soap after using and rinsing off  the CHG Soap.                9.  Pat yourself dry with a clean towel.            10.  Wear clean pajamas.            11.  Place clean sheets on your bed the night of your first shower and do not  sleep with pets. Day of Surgery : Do not apply any lotions/deodorants the morning of surgery.  Please wear clean clothes to the hospital/surgery center.  FAILURE TO FOLLOW THESE INSTRUCTIONS MAY RESULT IN THE CANCELLATION OF YOUR SURGERY PATIENT SIGNATURE_________________________________  NURSE SIGNATURE__________________________________  ________________________________________________________________________   Adam Phenix  An incentive spirometer is a tool that can help keep your lungs clear and active. This tool measures how well you are filling your lungs with each breath. Taking long deep breaths may help reverse or decrease the chance of developing breathing (pulmonary) problems (especially infection) following:  A long period of time  when you are unable to move or be active. BEFORE THE PROCEDURE   If the spirometer includes an indicator to show your best effort, your nurse or respiratory therapist will set it to a desired goal.  If possible, sit up straight or lean slightly forward. Try not to slouch.  Hold the incentive spirometer in an upright position. INSTRUCTIONS FOR USE  1. Sit on the edge of your bed if possible, or sit up as far as you can in bed or on a chair. 2. Hold the incentive spirometer in an upright position. 3. Breathe out normally. 4. Place the mouthpiece in your mouth and seal your lips tightly around it. 5. Breathe in slowly and as deeply as possible, raising the piston or the ball toward the top of the column. 6. Hold your breath for 3-5 seconds or for as long as possible. Allow the piston or ball to fall to the bottom of the column. 7. Remove the mouthpiece from your mouth and breathe out normally. 8. Rest for a few seconds and repeat Steps 1 through 7 at least 10 times every 1-2 hours when you are awake. Take your time and take a few normal breaths between deep breaths. 9. The spirometer may include an indicator to show your best effort. Use the indicator as a goal to work toward during each repetition. 10. After each set of 10 deep breaths, practice coughing to be sure your lungs are clear. If you have an incision (the cut made at the time of surgery), support your incision when coughing by placing a pillow or rolled up towels firmly against it. Once you are able to get out of bed, walk around indoors and cough well. You may stop using the incentive spirometer when instructed by your caregiver.  RISKS AND COMPLICATIONS  Take your time so you do not get dizzy or light-headed.  If you are in pain, you may need to take or ask for pain medication before doing incentive spirometry. It is harder to take a deep breath if you are having pain.  AFTER USE  Rest and breathe slowly and easily.  It can be  helpful to keep track of a log of your progress. Your caregiver can provide you with a simple table to help with this. If you are using the spirometer at home, follow these instructions: [place] IF:   You are having difficultly using the spirometer.  You have trouble using the spirometer as often as instructed.  Your pain medication is not giving enough relief while using the spirometer.  You develop fever of 100.5 F (38.1 C) or higher. SEEK IMMEDIATE MEDICAL CARE IF:   You cough up bloody sputum that had not been present before.  You develop fever of 102 F (38.9 C) or greater.  You develop worsening pain at or near the incision site. MAKE SURE YOU:   Understand these instructions.  Will watch your condition.  Will get help right away if you are not doing well or get worse. Document Released: 12/19/2006 Document Revised: 10/31/2011 Document Reviewed: 02/19/2007 ExitCare Patient Information 2014 ExitCare, [place].   ________________________________________________________________________  WHAT IS A BLOOD TRANSFUSION? Blood Transfusion Information  A transfusion is the replacement of blood or some of its parts. Blood is made up of multiple cells which provide different functions.  Red blood cells carry oxygen and are used for blood loss replacement.  White blood cells fight against infection.  Platelets control bleeding.  Plasma helps clot blood.  Other blood products are available for specialized needs, such as hemophilia or other clotting disorders. BEFORE THE TRANSFUSION  Who gives blood for transfusions?   Healthy volunteers who are fully evaluated to make sure their blood is safe. This is blood bank blood. Transfusion therapy is the safest it has ever been in the practice of medicine. Before blood is taken from a donor, a complete history is taken to make sure that person has no history of diseases nor engages in risky social behavior (examples are  intravenous drug use or sexual activity with multiple partners). The donor's travel history is screened to minimize risk of transmitting infections, such as malaria. The donated blood is tested for signs of infectious diseases, such as HIV and hepatitis. The blood is then tested to be sure it is compatible with you in order to minimize the chance of a transfusion reaction. If you or a relative donates blood, this is often done in anticipation of surgery and is not appropriate for emergency situations. It takes many days to process the donated blood. RISKS AND COMPLICATIONS Although transfusion therapy is very safe and saves many lives, the main dangers of transfusion include:   Getting an infectious disease.  Developing a transfusion reaction. This is an allergic reaction to something in the blood you were given. Every precaution is taken to prevent this. The decision to have a blood transfusion has been considered carefully by your caregiver before blood is given. Blood is not given unless the benefits outweigh the risks. AFTER THE TRANSFUSION  Right after receiving a blood transfusion, you will usually feel much better and more energetic. This is especially true if your red blood cells have gotten low (anemic). The transfusion raises the level of the red blood cells which carry oxygen, and this usually causes an energy increase.  The nurse administering the transfusion will monitor you carefully for complications. HOME CARE INSTRUCTIONS  No special instructions are needed after a transfusion. You may find your energy is better. Speak with your caregiver about any limitations on activity for underlying diseases  you may have. SEEK MEDICAL CARE IF:   Your condition is not improving after your transfusion.  You develop redness or irritation at the intravenous (IV) site. SEEK IMMEDIATE MEDICAL CARE IF:  Any of the following symptoms occur over the next 12 hours:  Shaking chills.  You have a  temperature by mouth above 102 F (38.9 C), not controlled by medicine.  Chest, back, or muscle pain.  People around you feel you are not acting correctly or are confused.  Shortness of breath or difficulty breathing.  Dizziness and fainting.  You get a rash or develop hives.  You have a decrease in urine output.  Your urine turns a dark color or changes to pink, red, or brown. Any of the following symptoms occur over the next 10 days:  You have a temperature by mouth above 102 F (38.9 C), not controlled by medicine.  Shortness of breath.  Weakness after normal activity.  The white part of the eye turns yellow (jaundice).  You have a decrease in the amount of urine or are urinating less often.  Your urine turns a dark color or changes to pink, red, or brown. Document Released: 08/05/2000 Document Revised: 10/31/2011 Document Reviewed: 03/24/2008 [facility] Patient Information 2014 [place], [place].  _______________________________________________________________________

## 2019-03-27 NOTE — Progress Notes (Signed)
Epic  03-14-19 Cardiac Clearance fro [facility], [place]    EKG   02-27-19 Stress Test  05-05-18 ECHO

## 2019-03-28 ENCOUNTER — Other Ambulatory Visit: Payer: Self-pay

## 2019-03-28 ENCOUNTER — Encounter (HOSPITAL_COMMUNITY)
Admission: RE | Admit: 2019-03-28 | Discharge: 2019-03-28 | Disposition: A | Discharge status: Home / Self Care | Payer: Medicare Other | Source: Ambulatory Visit | Attending: Orthopedic Surgery | Admitting: Orthopedic Surgery

## 2019-03-28 ENCOUNTER — Encounter (HOSPITAL_COMMUNITY): Payer: Self-pay

## 2019-03-28 DIAGNOSIS — S7291XA Unspecified fracture of right femur, initial encounter for closed fracture: Secondary | ICD-10-CM | POA: Insufficient documentation

## 2019-03-28 DIAGNOSIS — Z20828 Contact with and (suspected) exposure to other viral communicable diseases: Secondary | ICD-10-CM | POA: Insufficient documentation

## 2019-03-28 DIAGNOSIS — Z01812 Encounter for preprocedural laboratory examination: Secondary | ICD-10-CM | POA: Diagnosis not present

## 2019-03-28 HISTORY — DX: Migraine, unspecified, not intractable, without status migrainosus: G43.909

## 2019-03-28 LAB — COMPREHENSIVE METABOLIC PANEL
ALT: 17 U/L (ref 0–44)
AST: 19 U/L (ref 15–41)
Albumin: 3.9 g/dL (ref 3.5–5.0)
Alkaline Phosphatase: 79 U/L (ref 38–126)
Anion gap: 9 (ref 5–15)
BUN: 9 mg/dL (ref 8–23)
CO2: 27 mmol/L (ref 22–32)
Calcium: 8.9 mg/dL (ref 8.9–10.3)
Chloride: 104 mmol/L (ref 98–111)
Creatinine, Ser: 0.7 mg/dL (ref 0.61–1.24)
GFR calc Af Amer: >60 mL/min (ref >60)
GFR calc non Af Amer: >60 mL/min (ref >60)
Glucose, Bld: 133 mg/dL — ABNORMAL HIGH (ref 70–99)
Potassium: 3.8 mmol/L (ref 3.5–5.1)
Sodium: 140 mmol/L (ref 135–145)
Total Bilirubin: 0.8 mg/dL (ref 0.3–1.2)
Total Protein: 7 g/dL (ref 6.5–8.1)

## 2019-03-28 LAB — CBC
HCT: 39.5 % (ref 39.0–52.0)
Hemoglobin: 13.2 g/dL (ref 13.0–17.0)
MCH: 33.7 pg (ref 26.0–34.0)
MCHC: 33.4 g/dL (ref 30.0–36.0)
MCV: 100.8 fL — ABNORMAL HIGH (ref 80.0–100.0)
Platelets: 174 10*3/uL (ref 150–400)
RBC: 3.92 MIL/uL — ABNORMAL LOW (ref 4.22–5.81)
RDW: 14.2 % (ref 11.5–15.5)
WBC: 7.6 10*3/uL (ref 4.0–10.5)
nRBC: 0 % (ref 0.0–0.2)

## 2019-03-28 LAB — SURGICAL PCR SCREEN
MRSA, PCR: NEGATIVE
Staphylococcus aureus: NEGATIVE

## 2019-03-28 LAB — PROTIME-INR
INR: 1 (ref 0.8–1.2)
Prothrombin Time: 13.1 seconds (ref 11.4–15.2)

## 2019-03-28 LAB — APTT: aPTT: 27 seconds (ref 24–36)

## 2019-03-28 LAB — HEMOGLOBIN A1C
Hgb A1c MFr Bld: 6.6 % — ABNORMAL HIGH (ref 4.8–5.6)
Mean Plasma Glucose: 142.72 mg/dL

## 2019-03-28 LAB — GLUCOSE, CAPILLARY: Glucose-Capillary: 136 mg/dL — ABNORMAL HIGH (ref 70–99)

## 2019-03-28 LAB — ABO/RH: ABO/RH(D): A POS

## 2019-03-28 NOTE — Progress Notes (Signed)
SPOKE W/  Sho     SCREENING SYMPTOMS OF COVID 19:   COUGH--NO  RUNNY NOSE--- NO  SORE THROAT---NO  NASAL CONGESTION----NO  SNEEZING----NO  SHORTNESS OF BREATH---NO  DIFFICULTY BREATHING---NO  TEMP >100.0 -----NO  UNEXPLAINED BODY ACHES------NO  CHILLS -------- NO  HEADACHES ---------NO  LOSS OF SMELL/ TASTE --------NO   HAVE YOU OR ANY FAMILY MEMBER TRAVELLED PAST 14 DAYS OUT OF THE   COUNTY---NO STATE----NO COUNTRY----NO  HAVE YOU OR ANY FAMILY MEMBER BEEN EXPOSED TO ANYONE WITH COVID 19? NO

## 2019-03-30 ENCOUNTER — Other Ambulatory Visit (HOSPITAL_COMMUNITY)
Admission: RE | Admit: 2019-03-30 | Discharge: 2019-03-30 | Disposition: A | Discharge status: Home / Self Care | Payer: Medicare Other | Source: Ambulatory Visit | Attending: Orthopedic Surgery | Admitting: Orthopedic Surgery

## 2019-03-30 DIAGNOSIS — Z01812 Encounter for preprocedural laboratory examination: Secondary | ICD-10-CM | POA: Diagnosis not present

## 2019-03-31 LAB — SARS CORONAVIRUS 2 (TAT 6-24 HRS): SARS Coronavirus 2: NEGATIVE
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
## 2019-04-02 NOTE — Anesthesia Preprocedure Evaluation (Addendum)
Anesthesia Evaluation  Patient identified by MRN, date of birth, ID band Patient awake    Reviewed: Allergy & Precautions, NPO status , Patient's Chart, lab work & pertinent test results  Airway Mallampati: II  TM Distance: >3 FB Neck ROM: Full    Dental  (+) Dental Advisory Given   Pulmonary Current Smoker,    breath sounds clear to auscultation       Cardiovascular hypertension, Pt. on medications and Pt. on home beta blockers + CAD, + Past MI, + Cardiac Stents and +CHF   Rhythm:Regular Rate:Normal     Neuro/Psych  Headaches,  Neuromuscular disease    GI/Hepatic Neg liver ROS, hiatal hernia, GERD  ,  Endo/Other  diabetes, Type 2, Oral Hypoglycemic Agents  Renal/GU negative Renal ROS     Musculoskeletal  (+) Arthritis ,   Abdominal   Peds  Hematology negative hematology ROS (+)   Anesthesia Other Findings   Reproductive/Obstetrics                            Lab Results  Component Value Date   WBC 7.6 03/28/2019   HGB 13.2 03/28/2019   HCT 39.5 03/28/2019   MCV 100.8 (H) 03/28/2019   PLT 174 03/28/2019   Lab Results  Component Value Date   CREATININE 0.70 03/28/2019   BUN 9 03/28/2019   NA 140 03/28/2019   K 3.8 03/28/2019   CL 104 03/28/2019   CO2 27 03/28/2019    Anesthesia Physical Anesthesia Plan  ASA: III  Anesthesia Plan: General   Post-op Pain Management:    Induction: Intravenous  PONV Risk Score and Plan: 1 and Ondansetron, Dexamethasone and Treatment may vary due to age or medical condition  Airway Management Planned: Oral ETT  Additional Equipment:   Intra-op Plan:   Post-operative Plan: Extubation in OR  Informed Consent: I have reviewed the patients History and Physical, chart, labs and discussed the procedure including the risks, benefits and alternatives for the proposed anesthesia with the patient or authorized representative who has indicated  his/her understanding and acceptance.     Dental advisory given  Plan Discussed with: CRNA  Anesthesia Plan Comments:        Anesthesia Quick Evaluation

## 2019-04-03 ENCOUNTER — Other Ambulatory Visit: Payer: Self-pay

## 2019-04-03 ENCOUNTER — Inpatient Hospital Stay (HOSPITAL_COMMUNITY): Payer: Medicare Other | Admitting: Anesthesiology

## 2019-04-03 ENCOUNTER — Inpatient Hospital Stay (HOSPITAL_COMMUNITY): Payer: Medicare Other | Admitting: Physician Assistant

## 2019-04-03 ENCOUNTER — Encounter (HOSPITAL_COMMUNITY): Payer: Self-pay | Admitting: Emergency Medicine

## 2019-04-03 ENCOUNTER — Encounter (HOSPITAL_COMMUNITY)
Admission: RE | Disposition: A | Discharge status: Home Health | Payer: Self-pay | Source: Other Acute Inpatient Hospital | Attending: Orthopedic Surgery

## 2019-04-03 ENCOUNTER — Inpatient Hospital Stay (HOSPITAL_COMMUNITY)
Admission: RE | Admit: 2019-04-03 | Discharge: 2019-04-05 | DRG: 470 | Disposition: A | Discharge status: Home Health | Payer: Medicare Other | Source: Other Acute Inpatient Hospital | Attending: Orthopedic Surgery | Admitting: Orthopedic Surgery

## 2019-04-03 ENCOUNTER — Inpatient Hospital Stay (HOSPITAL_COMMUNITY): Payer: Medicare Other

## 2019-04-03 DIAGNOSIS — E119 Type 2 diabetes mellitus without complications: Secondary | ICD-10-CM | POA: Diagnosis present

## 2019-04-03 DIAGNOSIS — I110 Hypertensive heart disease with heart failure: Secondary | ICD-10-CM | POA: Diagnosis present

## 2019-04-03 DIAGNOSIS — M1611 Unilateral primary osteoarthritis, right hip: Secondary | ICD-10-CM | POA: Diagnosis present

## 2019-04-03 DIAGNOSIS — I252 Old myocardial infarction: Secondary | ICD-10-CM | POA: Diagnosis not present

## 2019-04-03 DIAGNOSIS — Z7982 Long term (current) use of aspirin: Secondary | ICD-10-CM

## 2019-04-03 DIAGNOSIS — F17210 Nicotine dependence, cigarettes, uncomplicated: Secondary | ICD-10-CM | POA: Diagnosis present

## 2019-04-03 DIAGNOSIS — Z79899 Other long term (current) drug therapy: Secondary | ICD-10-CM

## 2019-04-03 DIAGNOSIS — Z96649 Presence of unspecified artificial hip joint: Secondary | ICD-10-CM

## 2019-04-03 DIAGNOSIS — H409 Unspecified glaucoma: Secondary | ICD-10-CM | POA: Diagnosis present

## 2019-04-03 DIAGNOSIS — Z888 Allergy status to other drugs, medicaments and biological substances status: Secondary | ICD-10-CM

## 2019-04-03 DIAGNOSIS — E7800 Pure hypercholesterolemia, unspecified: Secondary | ICD-10-CM | POA: Diagnosis present

## 2019-04-03 DIAGNOSIS — K219 Gastro-esophageal reflux disease without esophagitis: Secondary | ICD-10-CM | POA: Diagnosis present

## 2019-04-03 DIAGNOSIS — I5032 Chronic diastolic (congestive) heart failure: Secondary | ICD-10-CM | POA: Diagnosis present

## 2019-04-03 DIAGNOSIS — Z833 Family history of diabetes mellitus: Secondary | ICD-10-CM | POA: Diagnosis not present

## 2019-04-03 DIAGNOSIS — Z955 Presence of coronary angioplasty implant and graft: Secondary | ICD-10-CM | POA: Diagnosis not present

## 2019-04-03 DIAGNOSIS — J309 Allergic rhinitis, unspecified: Secondary | ICD-10-CM | POA: Diagnosis present

## 2019-04-03 DIAGNOSIS — Z20828 Contact with and (suspected) exposure to other viral communicable diseases: Secondary | ICD-10-CM | POA: Diagnosis present

## 2019-04-03 DIAGNOSIS — I2510 Atherosclerotic heart disease of native coronary artery without angina pectoris: Secondary | ICD-10-CM | POA: Diagnosis present

## 2019-04-03 DIAGNOSIS — Z981 Arthrodesis status: Secondary | ICD-10-CM

## 2019-04-03 DIAGNOSIS — Z881 Allergy status to other antibiotic agents status: Secondary | ICD-10-CM | POA: Diagnosis not present

## 2019-04-03 DIAGNOSIS — S72141K Displaced intertrochanteric fracture of right femur, subsequent encounter for closed fracture with nonunion: Secondary | ICD-10-CM | POA: Diagnosis present

## 2019-04-03 DIAGNOSIS — M169 Osteoarthritis of hip, unspecified: Secondary | ICD-10-CM | POA: Diagnosis present

## 2019-04-03 HISTORY — PX: TOTAL HIP REVISION: SHX763

## 2019-04-03 LAB — TYPE AND SCREEN
ABO/RH(D): A POS
Antibody Screen: NEGATIVE

## 2019-04-03 LAB — GLUCOSE, CAPILLARY
Glucose-Capillary: 134 mg/dL — ABNORMAL HIGH (ref 70–99)
Glucose-Capillary: 174 mg/dL — ABNORMAL HIGH (ref 70–99)
Glucose-Capillary: 203 mg/dL — ABNORMAL HIGH (ref 70–99)

## 2019-04-03 SURGERY — TOTAL HIP REVISION
Anesthesia: General | Laterality: Right

## 2019-04-03 MED ORDER — DIPHENHYDRAMINE HCL 12.5 MG/5ML PO ELIX: 12.5000 mg | ORAL_SOLUTION | ORAL | Status: DC | PRN

## 2019-04-03 MED ORDER — ALBUMIN HUMAN 5 % IV SOLN
INTRAVENOUS | Status: DC | PRN
  Administered 2019-04-03 (×2): via INTRAVENOUS

## 2019-04-03 MED ORDER — SUFENTANIL CITRATE 50 MCG/ML IV SOLN
INTRAVENOUS | Status: AC
  Filled 2019-04-03: qty 1

## 2019-04-03 MED ORDER — CHLORTHALIDONE 25 MG PO TABS: 25.0000 mg | ORAL_TABLET | Freq: Every day | ORAL | Status: DC | PRN

## 2019-04-03 MED ORDER — ASPIRIN EC 325 MG PO TBEC
325.0000 mg | DELAYED_RELEASE_TABLET | Freq: Two times a day (BID) | ORAL | Status: DC
  Administered 2019-04-04 – 2019-04-05 (×3): 325 mg via ORAL
  Filled 2019-04-03 (×3): qty 1

## 2019-04-03 MED ORDER — ONDANSETRON HCL 4 MG/2ML IJ SOLN: 4.0000 mg | Freq: Four times a day (QID) | INTRAMUSCULAR | Status: DC | PRN

## 2019-04-03 MED ORDER — SODIUM CHLORIDE 0.9 % IV SOLN
INTRAVENOUS | Status: DC | PRN
  Administered 2019-04-03: 40 ug/min via INTRAVENOUS

## 2019-04-03 MED ORDER — METOCLOPRAMIDE HCL 5 MG/ML IJ SOLN: 5.0000 mg | Freq: Three times a day (TID) | INTRAMUSCULAR | Status: DC | PRN

## 2019-04-03 MED ORDER — FENTANYL CITRATE (PF) 100 MCG/2ML IJ SOLN: 25.0000 ug | INTRAMUSCULAR | Status: DC | PRN

## 2019-04-03 MED ORDER — ALBUMIN HUMAN 5 % IV SOLN
INTRAVENOUS | Status: AC
  Filled 2019-04-03: qty 250

## 2019-04-03 MED ORDER — POTASSIUM CHLORIDE CRYS ER 20 MEQ PO TBCR
20.0000 meq | EXTENDED_RELEASE_TABLET | Freq: Every day | ORAL | Status: DC
  Administered 2019-04-04 – 2019-04-05 (×2): 20 meq via ORAL
  Filled 2019-04-03 (×2): qty 1

## 2019-04-03 MED ORDER — BUSPIRONE HCL 5 MG PO TABS
2.5000 mg | ORAL_TABLET | Freq: Two times a day (BID) | ORAL | Status: DC
  Administered 2019-04-03 – 2019-04-05 (×4): 2.5 mg via ORAL
  Filled 2019-04-03 (×4): qty 1

## 2019-04-03 MED ORDER — MENTHOL 3 MG MT LOZG: 1.0000 | LOZENGE | OROMUCOSAL | Status: DC | PRN

## 2019-04-03 MED ORDER — KETAMINE HCL 10 MG/ML IJ SOLN
INTRAMUSCULAR | Status: AC
  Filled 2019-04-03: qty 1

## 2019-04-03 MED ORDER — NITROGLYCERIN 0.4 MG SL SUBL: 0.4000 mg | SUBLINGUAL_TABLET | SUBLINGUAL | Status: DC | PRN

## 2019-04-03 MED ORDER — HYDROMORPHONE HCL 1 MG/ML IJ SOLN
0.5000 mg | INTRAMUSCULAR | Status: DC | PRN
  Administered 2019-04-03: 0.5 mg via INTRAVENOUS
  Administered 2019-04-04 (×2): 1 mg via INTRAVENOUS
  Filled 2019-04-03 (×3): qty 1

## 2019-04-03 MED ORDER — HYDROMORPHONE HCL 1 MG/ML IJ SOLN
0.2500 mg | INTRAMUSCULAR | Status: DC | PRN
  Administered 2019-04-03 (×4): 0.5 mg via INTRAVENOUS

## 2019-04-03 MED ORDER — METHOCARBAMOL 500 MG IVPB - SIMPLE MED
500.0000 mg | Freq: Four times a day (QID) | INTRAVENOUS | Status: DC | PRN
  Administered 2019-04-03: 16:00:00 500 mg via INTRAVENOUS
  Filled 2019-04-03: qty 50

## 2019-04-03 MED ORDER — DOCUSATE SODIUM 100 MG PO CAPS
100.0000 mg | ORAL_CAPSULE | Freq: Two times a day (BID) | ORAL | Status: DC
  Administered 2019-04-03 – 2019-04-05 (×4): 100 mg via ORAL
  Filled 2019-04-03 (×4): qty 1

## 2019-04-03 MED ORDER — METOCLOPRAMIDE HCL 5 MG PO TABS: 5.0000 mg | ORAL_TABLET | Freq: Three times a day (TID) | ORAL | Status: DC | PRN

## 2019-04-03 MED ORDER — METHOCARBAMOL 500 MG PO TABS
500.0000 mg | ORAL_TABLET | Freq: Four times a day (QID) | ORAL | Status: DC | PRN
  Administered 2019-04-03 – 2019-04-05 (×2): 500 mg via ORAL
  Filled 2019-04-03 (×2): qty 1

## 2019-04-03 MED ORDER — CEFAZOLIN SODIUM-DEXTROSE 2-4 GM/100ML-% IV SOLN
2.0000 g | Freq: Four times a day (QID) | INTRAVENOUS | Status: AC
  Administered 2019-04-03 – 2019-04-04 (×2): 2 g via INTRAVENOUS
  Filled 2019-04-03 (×2): qty 100

## 2019-04-03 MED ORDER — SODIUM CHLORIDE 0.9 % IV SOLN
INTRAVENOUS | Status: DC
  Administered 2019-04-03 – 2019-04-04 (×2): via INTRAVENOUS

## 2019-04-03 MED ORDER — BISACODYL 10 MG RE SUPP: 10.0000 mg | Freq: Every day | RECTAL | Status: DC | PRN

## 2019-04-03 MED ORDER — OXYCODONE HCL 5 MG PO TABS
10.0000 mg | ORAL_TABLET | ORAL | Status: DC | PRN
  Administered 2019-04-03: 10 mg via ORAL
  Administered 2019-04-04 – 2019-04-05 (×7): 15 mg via ORAL
  Filled 2019-04-03: qty 2
  Filled 2019-04-03 (×7): qty 3

## 2019-04-03 MED ORDER — PROPOFOL 10 MG/ML IV BOLUS
INTRAVENOUS | Status: DC | PRN
  Administered 2019-04-03: 120 mg via INTRAVENOUS

## 2019-04-03 MED ORDER — ACETAMINOPHEN 10 MG/ML IV SOLN
1000.0000 mg | Freq: Four times a day (QID) | INTRAVENOUS | Status: DC
  Administered 2019-04-03: 1000 mg via INTRAVENOUS
  Filled 2019-04-03 (×2): qty 100

## 2019-04-03 MED ORDER — LACTATED RINGERS IV SOLN
INTRAVENOUS | Status: DC
  Administered 2019-04-03 (×3): via INTRAVENOUS

## 2019-04-03 MED ORDER — KETAMINE HCL 10 MG/ML IJ SOLN
INTRAMUSCULAR | Status: DC | PRN
  Administered 2019-04-03: 10 mg via INTRAVENOUS
  Administered 2019-04-03: 50 mg via INTRAVENOUS
  Administered 2019-04-03: 10 mg via INTRAVENOUS

## 2019-04-03 MED ORDER — CEFAZOLIN SODIUM-DEXTROSE 2-4 GM/100ML-% IV SOLN
2.0000 g | INTRAVENOUS | Status: AC
  Administered 2019-04-03: 2 g via INTRAVENOUS
  Filled 2019-04-03: qty 100

## 2019-04-03 MED ORDER — ACETAMINOPHEN 500 MG PO TABS
1000.0000 mg | ORAL_TABLET | Freq: Four times a day (QID) | ORAL | Status: AC
  Administered 2019-04-03 – 2019-04-04 (×4): 1000 mg via ORAL
  Filled 2019-04-03 (×4): qty 2

## 2019-04-03 MED ORDER — PROMETHAZINE HCL 25 MG/ML IJ SOLN: 6.2500 mg | INTRAMUSCULAR | Status: DC | PRN

## 2019-04-03 MED ORDER — SUFENTANIL CITRATE 50 MCG/ML IV SOLN
INTRAVENOUS | Status: DC | PRN
  Administered 2019-04-03 (×4): 10 ug via INTRAVENOUS

## 2019-04-03 MED ORDER — METHOCARBAMOL 500 MG PO TABS
750.0000 mg | ORAL_TABLET | Freq: Three times a day (TID) | ORAL | Status: DC
  Administered 2019-04-04 – 2019-04-05 (×5): 750 mg via ORAL
  Filled 2019-04-03 (×5): qty 2

## 2019-04-03 MED ORDER — LIDOCAINE 2% (20 MG/ML) 5 ML SYRINGE
INTRAMUSCULAR | Status: AC
  Filled 2019-04-03: qty 5

## 2019-04-03 MED ORDER — DEXAMETHASONE SODIUM PHOSPHATE 10 MG/ML IJ SOLN
10.0000 mg | Freq: Once | INTRAMUSCULAR | Status: AC
  Administered 2019-04-04: 09:00:00 10 mg via INTRAVENOUS
  Filled 2019-04-03: qty 1

## 2019-04-03 MED ORDER — ROCURONIUM BROMIDE 10 MG/ML (PF) SYRINGE
PREFILLED_SYRINGE | INTRAVENOUS | Status: DC | PRN
  Administered 2019-04-03: 10 mg via INTRAVENOUS
  Administered 2019-04-03: 50 mg via INTRAVENOUS
  Administered 2019-04-03: 10 mg via INTRAVENOUS

## 2019-04-03 MED ORDER — SUCCINYLCHOLINE CHLORIDE 200 MG/10ML IV SOSY
PREFILLED_SYRINGE | INTRAVENOUS | Status: DC | PRN
  Administered 2019-04-03: 100 mg via INTRAVENOUS

## 2019-04-03 MED ORDER — METOPROLOL SUCCINATE ER 50 MG PO TB24
50.0000 mg | ORAL_TABLET | Freq: Every day | ORAL | Status: DC
  Administered 2019-04-04 – 2019-04-05 (×2): 50 mg via ORAL
  Filled 2019-04-03 (×2): qty 1

## 2019-04-03 MED ORDER — TRANEXAMIC ACID-NACL 1000-0.7 MG/100ML-% IV SOLN
1000.0000 mg | INTRAVENOUS | Status: AC
  Administered 2019-04-03: 1000 mg via INTRAVENOUS
  Filled 2019-04-03 (×2): qty 100

## 2019-04-03 MED ORDER — PHENOL 1.4 % MT LIQD
1.0000 | OROMUCOSAL | Status: DC | PRN
  Filled 2019-04-03: qty 177

## 2019-04-03 MED ORDER — SUGAMMADEX SODIUM 200 MG/2ML IV SOLN
INTRAVENOUS | Status: DC | PRN
  Administered 2019-04-03: 200 mg via INTRAVENOUS

## 2019-04-03 MED ORDER — ONDANSETRON HCL 4 MG PO TABS: 4.0000 mg | ORAL_TABLET | Freq: Four times a day (QID) | ORAL | Status: DC | PRN

## 2019-04-03 MED ORDER — GABAPENTIN 300 MG PO CAPS
600.0000 mg | ORAL_CAPSULE | Freq: Three times a day (TID) | ORAL | Status: DC
  Administered 2019-04-03 – 2019-04-05 (×6): 600 mg via ORAL
  Filled 2019-04-03 (×6): qty 2

## 2019-04-03 MED ORDER — CHLORHEXIDINE GLUCONATE 4 % EX LIQD: 60.0000 mL | Freq: Once | CUTANEOUS | Status: DC

## 2019-04-03 MED ORDER — DEXAMETHASONE SODIUM PHOSPHATE 10 MG/ML IJ SOLN
INTRAMUSCULAR | Status: AC
  Filled 2019-04-03: qty 1

## 2019-04-03 MED ORDER — SODIUM CHLORIDE 0.9 % IV SOLN: INTRAVENOUS | Status: DC | PRN

## 2019-04-03 MED ORDER — INSULIN ASPART 100 UNIT/ML ~~LOC~~ SOLN
0.0000 [IU] | Freq: Three times a day (TID) | SUBCUTANEOUS | Status: DC
  Administered 2019-04-04: 09:00:00 2 [IU] via SUBCUTANEOUS
  Administered 2019-04-04: 12:00:00 5 [IU] via SUBCUTANEOUS
  Administered 2019-04-04: 3 [IU] via SUBCUTANEOUS
  Administered 2019-04-05: 12:00:00 2 [IU] via SUBCUTANEOUS

## 2019-04-03 MED ORDER — POVIDONE-IODINE 10 % EX SWAB
2.0000 "application " | Freq: Once | CUTANEOUS | Status: AC
  Administered 2019-04-03: 2 via TOPICAL

## 2019-04-03 MED ORDER — HYDROMORPHONE HCL 1 MG/ML IJ SOLN
INTRAMUSCULAR | Status: AC
  Filled 2019-04-03: qty 2

## 2019-04-03 MED ORDER — OXYCODONE HCL ER 20 MG PO T12A
60.0000 mg | EXTENDED_RELEASE_TABLET | Freq: Three times a day (TID) | ORAL | Status: DC
  Administered 2019-04-03 – 2019-04-05 (×6): 60 mg via ORAL
  Filled 2019-04-03 (×6): qty 3

## 2019-04-03 MED ORDER — EPHEDRINE SULFATE-NACL 50-0.9 MG/10ML-% IV SOSY
PREFILLED_SYRINGE | INTRAVENOUS | Status: DC | PRN
  Administered 2019-04-03 (×3): 10 mg via INTRAVENOUS

## 2019-04-03 MED ORDER — FLUTICASONE PROPIONATE 50 MCG/ACT NA SUSP
2.0000 | Freq: Every day | NASAL | Status: DC
  Administered 2019-04-03 – 2019-04-04 (×2): 2 via NASAL
  Filled 2019-04-03: qty 16

## 2019-04-03 MED ORDER — FLEET ENEMA 7-19 GM/118ML RE ENEM: 1.0000 | ENEMA | Freq: Once | RECTAL | Status: DC | PRN

## 2019-04-03 MED ORDER — EPHEDRINE 5 MG/ML INJ
INTRAVENOUS | Status: AC
  Filled 2019-04-03: qty 10

## 2019-04-03 MED ORDER — PROPOFOL 10 MG/ML IV BOLUS
INTRAVENOUS | Status: AC
  Filled 2019-04-03: qty 20

## 2019-04-03 MED ORDER — METHOCARBAMOL 500 MG IVPB - SIMPLE MED
INTRAVENOUS | Status: AC
  Filled 2019-04-03: qty 50

## 2019-04-03 MED ORDER — ATORVASTATIN CALCIUM 40 MG PO TABS
40.0000 mg | ORAL_TABLET | Freq: Every day | ORAL | Status: DC
  Administered 2019-04-04: 40 mg via ORAL
  Filled 2019-04-03: qty 1

## 2019-04-03 MED ORDER — POLYETHYLENE GLYCOL 3350 17 G PO PACK: 17.0000 g | PACK | Freq: Every day | ORAL | Status: DC | PRN

## 2019-04-03 MED ORDER — TRANEXAMIC ACID-NACL 1000-0.7 MG/100ML-% IV SOLN
1000.0000 mg | Freq: Once | INTRAVENOUS | Status: AC
  Administered 2019-04-03: 19:00:00 1000 mg via INTRAVENOUS
  Filled 2019-04-03: qty 100

## 2019-04-03 MED ORDER — SODIUM CHLORIDE (PF) 0.9 % IJ SOLN
INTRAMUSCULAR | Status: AC
  Filled 2019-04-03: qty 10

## 2019-04-03 MED ORDER — ONDANSETRON HCL 4 MG/2ML IJ SOLN
INTRAMUSCULAR | Status: AC
  Filled 2019-04-03: qty 2

## 2019-04-03 MED ORDER — HYDROMORPHONE HCL 1 MG/ML IJ SOLN
0.2500 mg | INTRAMUSCULAR | Status: DC | PRN
  Administered 2019-04-03 (×2): 0.5 mg via INTRAVENOUS

## 2019-04-03 MED ORDER — DEXAMETHASONE SODIUM PHOSPHATE 10 MG/ML IJ SOLN
INTRAMUSCULAR | Status: DC | PRN
  Administered 2019-04-03: 10 mg via INTRAVENOUS

## 2019-04-03 MED ORDER — ALBUTEROL SULFATE (2.5 MG/3ML) 0.083% IN NEBU: 2.5000 mg | INHALATION_SOLUTION | RESPIRATORY_TRACT | Status: DC | PRN

## 2019-04-03 MED ORDER — FENTANYL CITRATE (PF) 100 MCG/2ML IJ SOLN
INTRAMUSCULAR | Status: AC
  Filled 2019-04-03: qty 2

## 2019-04-03 MED ORDER — LIDOCAINE 2% (20 MG/ML) 5 ML SYRINGE
INTRAMUSCULAR | Status: DC | PRN
  Administered 2019-04-03: 60 mg via INTRAVENOUS

## 2019-04-03 MED ORDER — SODIUM CHLORIDE 0.9 % IR SOLN
Status: DC | PRN
  Administered 2019-04-03: 1000 mL

## 2019-04-03 MED ORDER — ONDANSETRON HCL 4 MG/2ML IJ SOLN
INTRAMUSCULAR | Status: DC | PRN
  Administered 2019-04-03: 4 mg via INTRAVENOUS

## 2019-04-03 MED ORDER — ROCURONIUM BROMIDE 10 MG/ML (PF) SYRINGE
PREFILLED_SYRINGE | INTRAVENOUS | Status: AC
  Filled 2019-04-03: qty 10

## 2019-04-03 MED ORDER — DEXAMETHASONE SODIUM PHOSPHATE 10 MG/ML IJ SOLN: 8.0000 mg | Freq: Once | INTRAMUSCULAR | Status: DC

## 2019-04-03 MED ORDER — LATANOPROST 0.005 % OP SOLN
1.0000 [drp] | Freq: Every day | OPHTHALMIC | Status: DC
  Administered 2019-04-03 – 2019-04-04 (×2): 1 [drp] via OPHTHALMIC
  Filled 2019-04-03: qty 2.5

## 2019-04-03 MED ORDER — BUPIVACAINE HCL (PF) 0.25 % IJ SOLN
INTRAMUSCULAR | Status: AC
  Filled 2019-04-03: qty 30

## 2019-04-03 SURGICAL SUPPLY — 73 items
AML 19.5 SML 150 LEN 43 ×2 IMPLANT
AML 19.5 SML 150 LEN 43MM ×1 IMPLANT
BAG DECANTER FOR FLEXI CONT (MISCELLANEOUS) IMPLANT
BAG ZIPLOCK 12X15 (MISCELLANEOUS) ×6 IMPLANT
BIT DRILL 2.8X128 (BIT) ×2 IMPLANT
BIT DRILL 2.8X128MM (BIT) ×1
BLADE EXTENDED COATED 6.5IN (ELECTRODE) ×3 IMPLANT
BLADE SAW SAG 73X25 THK (BLADE) ×1
BLADE SAW SGTL 73X25 THK (BLADE) ×2 IMPLANT
BRUSH FEMORAL CANAL (MISCELLANEOUS) IMPLANT
COVER SURGICAL LIGHT HANDLE (MISCELLANEOUS) ×3 IMPLANT
COVER WAND RF STERILE (DRAPES) IMPLANT
CUP ACETBLR 52 OD PINNACLE (Hips) ×3 IMPLANT
DRAPE INCISE IOBAN 66X45 STRL (DRAPES) ×3 IMPLANT
DRAPE ORTHO SPLIT 77X108 STRL (DRAPES) ×4
DRAPE POUCH INSTRU U-SHP 10X18 (DRAPES) ×3 IMPLANT
DRAPE SURG ORHT 6 SPLT 77X108 (DRAPES) ×2 IMPLANT
DRAPE U-SHAPE 47X51 STRL (DRAPES) ×3 IMPLANT
DRSG ADAPTIC 3X8 NADH LF (GAUZE/BANDAGES/DRESSINGS) ×3 IMPLANT
DRSG EMULSION OIL 3X16 NADH (GAUZE/BANDAGES/DRESSINGS) ×3 IMPLANT
DRSG MEPILEX BORDER 4X12 (GAUZE/BANDAGES/DRESSINGS) ×3 IMPLANT
DRSG MEPILEX BORDER 4X4 (GAUZE/BANDAGES/DRESSINGS) ×3 IMPLANT
DRSG MEPILEX BORDER 4X8 (GAUZE/BANDAGES/DRESSINGS) ×3 IMPLANT
DURAPREP 26ML APPLICATOR (WOUND CARE) ×3 IMPLANT
ELECT REM PT RETURN 15FT ADLT (MISCELLANEOUS) ×3 IMPLANT
EVACUATOR 1/8 PVC DRAIN (DRAIN) ×3 IMPLANT
FACESHIELD WRAPAROUND (MASK) ×12 IMPLANT
GAUZE SPONGE 4X4 12PLY STRL (GAUZE/BANDAGES/DRESSINGS) ×3 IMPLANT
GLOVE BIO SURGEON STRL SZ7 (GLOVE) ×3 IMPLANT
GLOVE BIO SURGEON STRL SZ8 (GLOVE) ×3 IMPLANT
GLOVE BIOGEL PI IND STRL 7.0 (GLOVE) ×1 IMPLANT
GLOVE BIOGEL PI IND STRL 8 (GLOVE) ×1 IMPLANT
GLOVE BIOGEL PI INDICATOR 7.0 (GLOVE) ×2
GLOVE BIOGEL PI INDICATOR 8 (GLOVE) ×2
GOWN STRL REUS W/TWL LRG LVL3 (GOWN DISPOSABLE) ×6 IMPLANT
HANDPIECE INTERPULSE COAX TIP (DISPOSABLE)
HEAD M SROM 36MM 2 (Hips) ×1 IMPLANT
HIP AML 19.5 SML 150 LEN 43 ×1 IMPLANT
HOLDER FOLEY CATH W/STRAP (MISCELLANEOUS) ×3 IMPLANT
IMMOBILIZER KNEE 20 (SOFTGOODS) ×3
IMMOBILIZER KNEE 20 THIGH 36 (SOFTGOODS) ×1 IMPLANT
KIT TURNOVER KIT A (KITS) IMPLANT
LINER MARATHON NEUT +4X52X36 (Hips) ×3 IMPLANT
MANIFOLD NEPTUNE II (INSTRUMENTS) ×3 IMPLANT
MARKER SKIN DUAL TIP RULER LAB (MISCELLANEOUS) ×3 IMPLANT
NDL SAFETY ECLIPSE 18X1.5 (NEEDLE) ×1 IMPLANT
NEEDLE HYPO 18GX1.5 SHARP (NEEDLE) ×2
NS IRRIG 1000ML POUR BTL (IV SOLUTION) ×3 IMPLANT
PACK TOTAL JOINT (CUSTOM PROCEDURE TRAY) ×3 IMPLANT
PASSER SUT SWANSON 36MM LOOP (INSTRUMENTS) ×3 IMPLANT
PRESSURIZER FEMORAL UNIV (MISCELLANEOUS) IMPLANT
PROTECTOR NERVE ULNAR (MISCELLANEOUS) ×3 IMPLANT
SET HNDPC FAN SPRY TIP SCT (DISPOSABLE) IMPLANT
SPONGE LAP 18X18 RF (DISPOSABLE) IMPLANT
SROM M HEAD 36MM 2 (Hips) ×3 IMPLANT
STAPLER VISISTAT 35W (STAPLE) ×3 IMPLANT
SUCTION FRAZIER HANDLE 12FR (TUBING) ×2
SUCTION TUBE FRAZIER 12FR DISP (TUBING) ×1 IMPLANT
SUT ETHIBOND NAB CT1 #1 30IN (SUTURE) IMPLANT
SUT STRATAFIX 0 PDS 27 VIOLET (SUTURE) ×3
SUT VIC AB 1 CT1 27 (SUTURE) ×6
SUT VIC AB 1 CT1 27XBRD ANTBC (SUTURE) ×3 IMPLANT
SUT VIC AB 2-0 CT1 27 (SUTURE) ×6
SUT VIC AB 2-0 CT1 TAPERPNT 27 (SUTURE) ×3 IMPLANT
SUTURE STRATFX 0 PDS 27 VIOLET (SUTURE) ×1 IMPLANT
SWAB COLLECTION DEVICE MRSA (MISCELLANEOUS) IMPLANT
SWAB CULTURE ESWAB REG 1ML (MISCELLANEOUS) IMPLANT
SYR 50ML LL SCALE MARK (SYRINGE) ×3 IMPLANT
TOWEL OR 17X26 10 PK STRL BLUE (TOWEL DISPOSABLE) ×6 IMPLANT
TOWER CARTRIDGE SMART MIX (DISPOSABLE) IMPLANT
TRAY FOLEY MTR SLVR 16FR STAT (SET/KITS/TRAYS/PACK) ×3 IMPLANT
WATER STERILE IRR 1000ML POUR (IV SOLUTION) ×6 IMPLANT
YANKAUER SUCT BULB TIP 10FT TU (MISCELLANEOUS) ×3 IMPLANT

## 2019-04-03 NOTE — Anesthesia Procedure Notes (Signed)
Procedure Name: Intubation Date/Time: 04/03/2019 12:51 PM Performed by: Sharlette Dense, CRNA Patient Re-evaluated:Patient Re-evaluated prior to induction Oxygen Delivery Method: Circle system utilized Preoxygenation: Pre-oxygenation with 100% oxygen Induction Type: IV induction Ventilation: Mask ventilation without difficulty Laryngoscope Size: 3 and Miller Grade View: Grade I Tube type: Oral Tube size: 8.0 mm Number of attempts: 1 Airway Equipment and Method: Stylet Placement Confirmation: ETT inserted through vocal cords under direct vision,  positive ETCO2 and breath sounds checked- equal and bilateral Secured at: 22 cm Tube secured with: Tape Dental Injury: Teeth and Oropharynx as per pre-operative assessment

## 2019-04-03 NOTE — Progress Notes (Signed)
Patient arrived to unit, oriented to room and equipment. Patient too drowsy to ask admission questions.

## 2019-04-03 NOTE — Op Note (Signed)
NAME: [name], SKOG MEDICAL RECORD [id number] ACCOUNT [id number] DATE OF BIRTH:October 11, 1945 FACILITY: [facility] LOCATION: [id number] PHYSICIAN:Kazaria Gaertner Zella Ball, MD  OPERATIVE REPORT  DATE OF PROCEDURE:  04/03/2019  PREOPERATIVE DIAGNOSIS:  Nonunion right intertrochanteric femur fracture.  POSTOPERATIVE DIAGNOSIS:  Nonunion right intertrochanteric femur fracture.  PROCEDURE:  Conversion of previous hip surgery to right total hip arthroplasty.  SURGEON:  Gaynelle Arabian, MD  ASSISTANT:  Ashley Jacobs, PA-C   ANESTHESIA:  General.  ESTIMATED BLOOD LOSS:  800 mL.  DRAIN:  Hemovac x1.  COMPLICATIONS:  None.  CONDITION:  Stable to recovery.  BRIEF CLINICAL NOTE:  The patient is a 73 year old male who had an intertrochanteric right femur fracture approximately a year ago.  He has had persistent worsening pain over the past few months.  An x-ray and CT were consistent with a nonunion of this  fracture.  He presents now for hardware removal and conversion to a total hip arthroplasty.  PROCEDURE IN DETAIL:  After successful administration of general anesthetic, the patient was placed in the left lateral decubitus position with the right side up and held with a hip positioner.  Right lower extremity was isolated from his perineum with  plastic drapes and prepped and draped in the usual sterile fashion.  Posterolateral incision was made with a 10 blade through subcutaneous tissue to the level of fascia lata, which was incised in line with the skin incision.  Sciatic nerve was palpated  and protected.  I was able to palpate the tip of the screw at the tip of the greater trochanter.  I made a small split in the gluteus medius to get to the IM nail.  I then was able to place the screwdriver to remove the locking screw proximally.  Once  this was removed, then I made a small incision in the vastus lateralis fascia and identified the lag screw.  It was going through the femoral neck.  I was able to  remove the lag screw.  We then placed the extraction device for the nail proximally and  then identified the distal interlock nail on the lateral cortex of the femur and removed that.  I had placed the slap hammer to remove the nail, and the proximal part of the nail up to the level of the first interlock hole for the lag screw had broken  off from the rest of the nail.  This is probably due to the nonunion.  I felt that I was easily going to be able to remove the rest of the nail, and we were preparing for the femur.  The short external rotators and capsule were then isolated off the posterior femur, and the hip was dislocated.  There was obvious motion at the femoral head and neck.  The neck resection was then made with an oscillating saw.  I removed the ununited  portions of the neck to gain access to the proximal femur.  The femur then retracted anteriorly to gain acetabular exposure.  Acetabular retractors were placed and labrum removed.  The reaming started at 45, coursing up to 51 mm.  A 52 mm Pinnacle  acetabular shell was placed in anatomic position with excellent purchase.  We did not need any additional dome screws.  A 36 mm neutral +4 marathon liner was placed into the shell.  We addressed the femur again.  I removed overgrown bone proximally to gain access to the femoral canal.  There was also a lot of fibrous tissue present at the area of  the previous nonunion.  All of this was removed.  I was unable to access the rest of  the femoral nail, and I grabbed that with a plier and removed it.  We then gained access to the canal, and I thoroughly irrigated the canal.  I began reaming with the AML reamers starting at 8 mm up to 17.5 mm, which had excellent cortical chatter.  This  would allow for placement of the 18 mm stem.  We then placed the broaches starting at 15, then 16.5 and 18 and about 20 degrees of anteversion.  I got to the depth of insertion as I desired and then placed the femoral neck  and a 36+1.5 head.  The +1.5  was not used to reduce.  I went to a -2 head which had much better soft tissue tension.  The hip was reduced with great stability.  There was full extension, full external rotation, 70 degrees flexion, 40 degrees adduction, and 90 degrees internal  rotation, then 90 degrees of flexion and 70 degrees of internal rotation.  By placing the right leg on top of the left, it felt as though the leg lengths were equal.  The hip was dislocated and trials were removed.  The permanent 18 mm AML small-stature stem was then impacted into the femur and 20 degrees of anteversion to the appropriate depth that we had desired.  I placed a 36 -2 metal head and impacted that onto  the stem.  The hip was reduced with outstanding stability.  The wound was copiously irrigated with saline solution, and short rotators and capsule reattached to the femur through drill holes with Ethibond suture.  The split in the gluteus medius was also  closed with the Ethibond.  Fascia lata was closed over a Hemovac drain with running #1 Stratafix suture.  Subcu was closed with interrupted 2-0 Vicryl and skin with staples.  Incisions were cleaned and dried and then a bulky sterile dressing applied.   He was placed into a knee immobilizer, awakened, and transported to recovery in stable condition.  Note that a surgical assistant was a medical necessity for this procedure to do it in a safe and expeditious manner.  Surgical assistant was necessary for protection of vital neurovascular structures and for exposure of the femur to allow for removal of  the hardware and then safe and accurate placement of the new prosthesis.  LN/NUANCE  D:04/03/2019 T:04/03/2019 JOB:[id number]

## 2019-04-03 NOTE — Progress Notes (Signed)
Patient has yellow muse due to LOC. Patient just got back from surgery and is drowsy, he awakens to voice.

## 2019-04-03 NOTE — Transfer of Care (Signed)
Immediate Anesthesia Transfer of Care Note  Patient: [name]  Procedure(s) Performed: Conversion of previous hip surgery to right total hip arthroplasty (Right )  Patient Location: PACU  Anesthesia Type:General  Level of Consciousness: drowsy  Airway & Oxygen Therapy: Patient Spontanous Breathing and Patient connected to face mask oxygen  Post-op Assessment: Report given to RN and Post -op Vital signs reviewed and stable  Post vital signs: Reviewed and stable  Last Vitals:  Vitals Value Taken Time  BP 153/72 04/03/19 1532  Temp    Pulse 77 04/03/19 1534  Resp 23 04/03/19 1534  SpO2 100 % 04/03/19 1534  Vitals shown include unvalidated device data.  Last Pain:  Vitals:   04/03/19 1052  TempSrc:   PainSc: 7       Patients Stated Pain Goal: 4 (12/82/08 1388)  Complications: No apparent anesthesia complications

## 2019-04-03 NOTE — Progress Notes (Signed)
Ketamine 20 mg given per Dr Benjamine Mola, R.

## 2019-04-03 NOTE — Discharge Instructions (Addendum)
Dr. Gaynelle Arabian Total Joint Specialist [facility] [address], [place], [place] [address] [phone]  POSTERIOR TOTAL HIP REPLACEMENT POSTOPERATIVE DIRECTIONS  Hip Rehabilitation, Guidelines Following Surgery  The results of a hip operation are greatly improved after range of motion and muscle strengthening exercises. Follow all safety measures which are given to protect your hip. If any of these exercises cause increased pain or swelling in your joint, decrease the amount until you are comfortable again. Then slowly increase the exercises. Call your caregiver if you have problems or questions.   HOME CARE INSTRUCTIONS   Remove items at home which could result in a fall. This includes throw rugs or furniture in walking pathways.   ICE to the affected hip every three hours for 30 minutes at a time and then as needed for pain and swelling.  Continue to use ice on the hip for pain and swelling from surgery. You may notice swelling that will progress down to the foot and ankle.  This is normal after surgery.  Elevate the leg when you are not up walking on it.    Continue to use the breathing machine which will help keep your temperature down.  It is common for your temperature to cycle up and down following surgery, especially at night when you are not up moving around and exerting yourself.  The breathing machine keeps your lungs expanded and your temperature down.  DIET You may resume your previous home diet once your are discharged from the hospital.  DRESSING / WOUND CARE / SHOWERING You may change your dressing 3-5 days after surgery.  Then change the dressing every day with sterile gauze.  Please use good hand washing techniques before changing the dressing.  Do not use any lotions or creams on the incision until instructed by your surgeon. You may start showering once you are discharged home but do not submerge the incision under water. Just pat the incision dry  and apply a dry gauze dressing on daily. Change the surgical dressing daily and reapply a dry dressing each time.  ACTIVITY Walk with your walker as instructed. Use walker as long as suggested by your caregivers. Avoid periods of inactivity such as sitting longer than an hour when not asleep. This helps prevent blood clots.  You may resume a sexual relationship in one month or when given the OK by your doctor.  You may return to work once you are cleared by your doctor.  Do not drive a car for 6 weeks or until released by you surgeon.  Do not drive while taking narcotics.  WEIGHT BEARING Weight bearing as tolerated with assist device (walker, cane, etc) as directed, use it as long as suggested by your surgeon or therapist, typically at least 4-6 weeks.  POSTOPERATIVE CONSTIPATION PROTOCOL Constipation - defined medically as fewer than three stools per week and severe constipation as less than one stool per week.  One of the most common issues patients have following surgery is constipation.  Even if you have a regular bowel pattern at home, your normal regimen is likely to be disrupted due to multiple reasons following surgery.  Combination of anesthesia, postoperative narcotics, change in appetite and fluid intake all can affect your bowels.  In order to avoid complications following surgery, here are some recommendations in order to help you during your recovery period.  Colace (docusate) - Pick up an over-the-counter form of Colace or another stool softener and take twice a day as  long as you are requiring postoperative pain medications.  Take with a full glass of water daily.  If you experience loose stools or diarrhea, hold the colace until you stool forms back up.  If your symptoms do not get better within 1 week or if they get worse, check with your doctor.  Dulcolax (bisacodyl) - Pick up over-the-counter and take as directed by the product packaging as needed to assist with the movement  of your bowels.  Take with a full glass of water.  Use this product as needed if not relieved by Colace only.   MiraLax (polyethylene glycol) - Pick up over-the-counter to have on hand.  MiraLax is a solution that will increase the amount of water in your bowels to assist with bowel movements.  Take as directed and can mix with a glass of water, juice, soda, coffee, or tea.  Take if you go more than two days without a movement. Do not use MiraLax more than once per day. Call your doctor if you are still constipated or irregular after using this medication for 7 days in a row.  If you continue to have problems with postoperative constipation, please contact the office for further assistance and recommendations.  If you experience "the worst abdominal pain ever" or develop nausea or vomiting, please contact the office immediatly for further recommendations for treatment.  ITCHING  If you experience itching with your medications, try taking only a single pain pill, or even half a pain pill at a time.  You can also use Benadryl over the counter for itching or also to help with sleep.   TED HOSE STOCKINGS Wear the elastic stockings on both legs for three weeks following surgery during the day but you may remove then at night for sleeping.  MEDICATIONS See your medication summary on the After Visit Summary that the nursing staff will review with you prior to discharge.  You may have some home medications which will be placed on hold until you complete the course of blood thinner medication.  It is important for you to complete the blood thinner medication as prescribed by your surgeon.  Continue your approved medications as instructed at time of discharge.  PRECAUTIONS If you experience chest pain or shortness of breath - call 911 immediately for transfer to the hospital emergency department.  If you develop a fever greater that 101 F, purulent drainage from wound, increased redness or drainage from  wound, foul odor from the wound/dressing, or calf pain - CONTACT YOUR SURGEON.                                                   FOLLOW-UP APPOINTMENTS Make sure you keep all of your appointments after your operation with your surgeon and caregivers. You should call the office at the above phone number and make an appointment for approximately two weeks after the date of your surgery or on the date instructed by your surgeon outlined in the "After Visit Summary".  RANGE OF MOTION AND STRENGTHENING EXERCISES  These exercises are designed to help you keep full movement of your hip joint. Follow your caregiver's or physical therapist's instructions. Perform all exercises about fifteen times, three times per day or as directed. Exercise both hips, even if you have had only one joint replacement. These exercises can be done on a  training (exercise) mat, on the floor, on a table or on a bed. Use whatever works the best and is most comfortable for you. Use music or television while you are exercising so that the exercises are a pleasant break in your day. This will make your life better with the exercises acting as a break in routine you can look forward to.   Lying on your back, slowly slide your foot toward your buttocks, raising your knee up off the floor. Then slowly slide your foot back down until your leg is straight again.   Lying on your back spread your legs as far apart as you can without causing discomfort.   Lying on your side, raise your upper leg and foot straight up from the floor as far as is comfortable. Slowly lower the leg and repeat.   Lying on your back, tighten up the muscle in the front of your thigh (quadriceps muscles). You can do this by keeping your leg straight and trying to raise your heel off the floor. This helps strengthen the largest muscle supporting your knee.   Lying on your back, tighten up the muscles of your buttocks both with the legs straight and with the knee bent  at a comfortable angle while keeping your heel on the floor.   IF YOU ARE TRANSFERRED TO A SKILLED REHAB FACILITY If the patient is transferred to a skilled rehab facility following release from the hospital, a list of the current medications will be sent to the facility for the patient to continue.  When discharged from the skilled rehab facility, please have the facility set up the patient's [place] prior to being released. Also, the skilled facility will be responsible for providing the patient with their medications at time of release from the facility to include their pain medication, the muscle relaxants, and their blood thinner medication. If the patient is still at the rehab facility at time of the two week follow up appointment, the skilled rehab facility will also need to assist the patient in arranging follow up appointment in our office and any transportation needs.  MAKE SURE YOU:   Understand these instructions.   Get help right away if you are not doing well or get worse.    Pick up stool softner and laxative for home use following surgery while on pain medications. Do not submerge incision under water. Please use good hand washing techniques while changing dressing each day. May shower starting three days after surgery. Please use a clean towel to pat the incision dry following showers. Continue to use ice for pain and swelling after surgery. Do not use any lotions or creams on the incision until instructed by your surgeon.

## 2019-04-03 NOTE — Progress Notes (Signed)
Pt screaming out. Reaching for tubes. Additional staff at bedside to console patient, offer comfort.

## 2019-04-03 NOTE — Progress Notes (Signed)
Ketamine 10mg  given per Dr Ola Spurr, R.

## 2019-04-03 NOTE — Brief Op Note (Signed)
04/03/2019  3:06 PM  PATIENT:  [name]  73 y.o. male  PRE-OPERATIVE DIAGNOSIS:  nonunion right femur fracture  POST-OPERATIVE DIAGNOSIS:  nonunion right femur fracture  PROCEDURE:  Procedure(s) with comments: Conversion of previous hip surgery to right total hip arthroplasty (Right) - 177min  SURGEON:  Surgeon(s) and Role:    Gaynelle Arabian, MD - Primary  PHYSICIAN ASSISTANT:   ASSISTANTS: Theresa Duty, PA-C   ANESTHESIA:   general  EBL:  800 mL   BLOOD ADMINISTERED:none  DRAINS: (Medium) Hemovact drain(s) in the right hip with  Suction Open   LOCAL MEDICATIONS USED:  NONE  COUNTS:  YES  TOURNIQUET:  * No tourniquets in log *  DICTATION: .Other Dictation: Dictation Number [id number]  PLAN OF CARE: Admit to inpatient   PATIENT DISPOSITION:  PACU - hemodynamically stable.

## 2019-04-03 NOTE — Anesthesia Postprocedure Evaluation (Signed)
Anesthesia Post Note  Patient: [name]  Procedure(s) Performed: Conversion of previous hip surgery to right total hip arthroplasty (Right )     Patient location during evaluation: PACU Anesthesia Type: General Level of consciousness: awake and alert Pain management: pain level controlled Vital Signs Assessment: post-procedure vital signs reviewed and stable Respiratory status: spontaneous breathing, nonlabored ventilation, respiratory function stable and patient connected to nasal cannula oxygen Cardiovascular status: blood pressure returned to baseline and stable Postop Assessment: no apparent nausea or vomiting Anesthetic complications: no    Last Vitals:  Vitals:   04/03/19 1808 04/03/19 1917  BP: 140/72 (!) 165/85  Pulse: (!) 57 64  Resp: 16 17  Temp: 36.4 C 36.6 C  SpO2: 100% 100%    Last Pain:  Vitals:   04/03/19 1942  TempSrc:   PainSc: 10-Worst pain ever                 Tiajuana Amass

## 2019-04-03 NOTE — Interval H&P Note (Signed)
History and Physical Interval Note:  04/03/2019 12:39 PM  [name]  has presented today for surgery, with the diagnosis of nonunion right femur fracture.  The various methods of treatment have been discussed with the patient and family. After consideration of risks, benefits and other options for treatment, the patient has consented to  Procedure(s) with comments: Conversion of previous hip surgery to right total hip arthroplasty (Right) - 156min as a surgical intervention.  The patient's history has been reviewed, patient examined, no change in status, stable for surgery.  I have reviewed the patient's chart and labs.  Questions were answered to the patient's satisfaction.     [name]

## 2019-04-04 ENCOUNTER — Encounter (HOSPITAL_COMMUNITY): Payer: Self-pay

## 2019-04-04 LAB — BASIC METABOLIC PANEL
Anion gap: 8 (ref 5–15)
BUN: 8 mg/dL (ref 8–23)
CO2: 27 mmol/L (ref 22–32)
Calcium: 8.3 mg/dL — ABNORMAL LOW (ref 8.9–10.3)
Chloride: 102 mmol/L (ref 98–111)
Creatinine, Ser: 0.64 mg/dL (ref 0.61–1.24)
GFR calc Af Amer: >60 mL/min (ref >60)
GFR calc non Af Amer: >60 mL/min (ref >60)
Glucose, Bld: 170 mg/dL — ABNORMAL HIGH (ref 70–99)
Potassium: 4 mmol/L (ref 3.5–5.1)
Sodium: 137 mmol/L (ref 135–145)

## 2019-04-04 LAB — CBC
HCT: 29 % — ABNORMAL LOW (ref 39.0–52.0)
Hemoglobin: 10.1 g/dL — ABNORMAL LOW (ref 13.0–17.0)
MCH: 34.7 pg — ABNORMAL HIGH (ref 26.0–34.0)
MCHC: 34.8 g/dL (ref 30.0–36.0)
MCV: 99.7 fL (ref 80.0–100.0)
Platelets: 164 10*3/uL (ref 150–400)
RBC: 2.91 MIL/uL — ABNORMAL LOW (ref 4.22–5.81)
RDW: 13.9 % (ref 11.5–15.5)
WBC: 11.2 10*3/uL — ABNORMAL HIGH (ref 4.0–10.5)
nRBC: 0.2 % (ref 0.0–0.2)

## 2019-04-04 LAB — GLUCOSE, CAPILLARY
Glucose-Capillary: 149 mg/dL — ABNORMAL HIGH (ref 70–99)
Glucose-Capillary: 226 mg/dL — ABNORMAL HIGH (ref 70–99)
Glucose-Capillary: 191 mg/dL — ABNORMAL HIGH (ref 70–99)
Glucose-Capillary: 144 mg/dL — ABNORMAL HIGH (ref 70–99)

## 2019-04-04 NOTE — Progress Notes (Signed)
Pt refused to have foley taken out

## 2019-04-04 NOTE — Progress Notes (Signed)
Subjective: 1 Day Post-Op Procedure(s) (LRB): Conversion of previous hip surgery to right total hip arthroplasty (Right) Patient seen in rounds with Dr. Wynelle Link. Patient endorsing pain, mostly originating from the back. History of extensive back issues. Reports right hip pain as moderate. Pt refused foley catheter removed with nurse earlier this AM, discussed with patient that this has to be discontinued and he is agreeable. Denies chest pain or SOB.  We will begin therapy today.   Objective: Vital signs in last 24 hours: Temp:  [97.6 F (36.4 C)-98.8 F (37.1 C)] 98.8 F (37.1 C) (08/13 0607) Pulse Rate:  [56-100] 86 (08/13 0607) Resp:  [14-24] 16 (08/13 0607) BP: (117-197)/(68-99) 117/70 (08/13 0607) SpO2:  [97 %-100 %] 100 % (08/13 0607) Weight:  [75.7 kg] 75.7 kg (08/12 1052)  Intake/Output from previous day:  Intake/Output Summary (Last 24 hours) at 04/04/2019 0801 Last data filed at 04/04/2019 0616 Gross per 24 hour  Intake 4471.16 ml  Output 5430 ml  Net -958.84 ml    Labs: Recent Labs    04/04/19 0553  HGB 10.1*   Recent Labs    04/04/19 0553  WBC 11.2*  RBC 2.91*  HCT 29.0*  PLT 164   Recent Labs    04/04/19 0553  NA 137  K 4.0  CL 102  CO2 27  BUN 8  CREATININE 0.64  GLUCOSE 170*  CALCIUM 8.3*   Exam: General - Patient is Alert and Oriented Extremity - Neurologically intact Neurovascular intact Sensation intact distally Dorsiflexion/Plantar flexion intact Dressing - dressing C/D/I Motor Function - intact, moving foot and toes well on exam.   Past Medical History:  Diagnosis Date  . ALLERGIC RHINITIS 05/02/2007  . C. difficile diarrhea   . CAD (coronary artery disease)    s/p DES to LAD and LCX in 2005 // [place] in 2009 - stents patent // nuc stress test 7/17 - no ischemia, EF 56 // rare anginal pain  . Cataract   . Chronic diastolic CHF (congestive heart failure) ([place])    Echo 9/19: mild focal basal septal hypertrophy, EF 55-60, no RWMA,  Gr 1 DD, mildly dilated ascending aorta (38 mm), normal RVSF, mild TR, PASP 20, GLS - 19.9 (normal)  . DEPRESSION 10/27/2009  . DIABETES MELLITUS, TYPE II 05/02/2007  . DUODENITIS 12/10/2007  . ESOPHAGEAL STRICTURE 12/10/2007  . Gall stones   . GERD 12/10/2007  . Glaucoma   . H/O hiatal hernia   . Heart murmur    Echo 7/17: EF 55-60, normal diast fxn // Echo 1/16: mild LVH, EF 55-60, mild MR, mild LAE  . HYPERCHOLESTEROLEMIA 10/27/2009  . HYPERTENSION 05/02/2007  . Insomnia   . Migraines    YEARS AGO  . Myocardial infarction ([place]) 2005  . Neuromuscular disorder ([place])    pt unaware  . OSTEOARTHRITIS, LUMBAR SPINE 10/27/2009  . Pneumonia   . TRANSAMINASES, SERUM, ELEVATED 12/10/2007    Assessment/Plan: 1 Day Post-Op Procedure(s) (LRB): Conversion of previous hip surgery to right total hip arthroplasty (Right) Active Problems:   OA (osteoarthritis) of hip  Estimated body mass index is 28.63 kg/m as calculated from the following:   Height as of this encounter: 5\' 4"  (1.626 m).   Weight as of this encounter: 75.7 kg. Advance diet Up with therapy  DVT Prophylaxis - Aspirin Weight bearing as tolerated D/C knee immobilizer Hemovac pulled without difficulty Begin therapy Hip precautions discussed with patient  Plan is to go Home after hospital stay. Plan for discharge tomorrow  as long as pain levels under control and meeting goals with therapy.  Theresa Duty, PA-C Orthopedic Surgery 04/04/2019, 8:01 AM

## 2019-04-04 NOTE — Progress Notes (Signed)
Therapy Plan: HEP Patient reports he has a RW and 3 in 1

## 2019-04-04 NOTE — Progress Notes (Signed)
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, CHF, [name]    PT Comments    Pt is cooperative but emotionally labile and expressing frustration with inability to progress faster post op and with his whole situation with limitations and constant pain since following fall from tree in 2003 and moreso in last year with non-union of hip fx.   Follow Up Recommendations  Home health PT;Follow surgeon's recommendation for DC plan and follow-up therapies     Equipment Recommendations  None recommended by PT    Recommendations for Other Services OT consult     Precautions / Restrictions Precautions Precautions: Posterior Hip;Fall Precaution Booklet Issued: Yes (comment) Precaution Comments: ALL precautions reviewed with pt Restrictions Weight Bearing Restrictions: No Other Position/Activity Restrictions: WBAT    Mobility  Bed Mobility Overal bed mobility: Needs Assistance Bed Mobility: Supine to Sit     Supine to sit: Min assist;Mod assist;+2 for physical assistance;+2 for safety/equipment     General bed mobility comments: Pt up in chair and requests back to same  Transfers Overall transfer level: Needs assistance Equipment used: Rolling walker (2 wheeled) Transfers: Sit to/from Stand Sit to Stand: Min assist;Mod assist;+2 safety/equipment;+2 physical assistance         General transfer comment: cues for LE management and use of UEs to self assist  Ambulation/Gait Ambulation/Gait assistance: Min assist;+2 physical assistance;+2 safety/equipment Gait Distance (Feet): 10 Feet(and additional 6') Assistive device: Rolling walker (2 wheeled) Gait Pattern/deviations: Step-to pattern;Decreased step length - right;Decreased step length - left;Shuffle;Trunk flexed Gait  velocity: decr   General Gait Details: cues for sequence, posture and position from [employer]             Wheelchair Mobility    Modified Rankin (Stroke Patients Only)       Balance Overall balance assessment: Needs assistance Sitting-balance support: No upper extremity supported;Feet supported Sitting balance-Leahy Scale: Fair     Standing balance support: Bilateral upper extremity supported Standing balance-Leahy Scale: Poor                              Cognition Arousal/Alertness: Awake/alert Behavior During Therapy: WFL for tasks assessed/performed Overall Cognitive Status: Within Functional Limits for tasks assessed                                        Exercises Total Joint Exercises Ankle Circles/Pumps: AROM;Both;15 reps;Supine Quad Sets: AROM;Both;10 reps;Supine Heel Slides: AAROM;Right;15 reps;Supine Hip ABduction/ADduction: AAROM;Right;15 reps;Supine    General Comments        Pertinent Vitals/Pain Pain Assessment: 0-10 Pain Score: 6  Pain Location: R hip Pain Descriptors / Indicators: Aching;Sore Pain Intervention(s): Limited activity within patient's tolerance;Monitored during session;Premedicated before session;Ice applied    Home Living Family/patient expects to be discharged to:: Private residence Living Arrangements: Children   Type of Home: Mobile home Home Access: Stairs to enter Entrance Stairs-Rails: Right;Left Home Layout: One level Home Equipment: Environmental consultant - 2 wheels;Cane - single point;Bedside commode;Crutches      Prior Function Level of Independence: Independent with assistive device(s)      Comments: Using RW   PT Goals (  current goals can now be found in the care plan section) Acute Rehab PT Goals Patient Stated Goal: Regain IND PT Goal Formulation: With patient Time For Goal Achievement: 04/18/19 Potential to Achieve Goals: Good Progress towards PT goals: Progressing toward goals     Frequency    7X/week      PT Plan Current plan remains appropriate    Co-evaluation              AM-PAC PT "6 Clicks" Mobility   Outcome Measure  Help needed turning from your back to your side while in a flat bed without using bedrails?: A Lot Help needed moving from lying on your back to sitting on the side of a flat bed without using bedrails?: A Lot Help needed moving to and from a bed to a chair (including a wheelchair)?: A Lot Help needed standing up from a chair using your arms (e.g., wheelchair or bedside chair)?: A Lot Help needed to walk in hospital room?: A Lot Help needed climbing 3-5 steps with a railing? : A Lot 6 Click Score: 12    End of Session Equipment Utilized During Treatment: Gait belt Activity Tolerance: Patient limited by fatigue;Patient limited by pain Patient left: in chair;with call bell/phone within reach;with chair alarm set;with family/visitor present Nurse Communication: Mobility status PT Visit Diagnosis: Difficulty in walking, not elsewhere classified (R26.2)     Time: 6387-5643 PT Time Calculation (min) (ACUTE ONLY): 24 min  Charges:  $Gait Training: 23-37 mins $Therapeutic Exercise: 8-22 mins                     Debe Coder PT Acute Rehabilitation Services Pager [phone] Office [phone]    Akeelah Seppala 04/04/2019, 5:08 PM

## 2019-04-04 NOTE — Evaluation (Signed)
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, CHF, [name]  Clinical Impression  Pt s/p R THR and presents with decreased R LE strength/ROM, post op pain and posterior THP limiting functional mobility.  Pt should progress to dc home with family assist.    Follow Up Recommendations Home health PT;Follow surgeon's recommendation for DC plan and follow-up therapies    Equipment Recommendations  None recommended by PT    Recommendations for Other Services OT consult     Precautions / Restrictions Precautions Precautions: Posterior Hip;Fall Precaution Booklet Issued: Yes (comment) Precaution Comments: ALL precautions reviewed with pt Restrictions Weight Bearing Restrictions: No Other Position/Activity Restrictions: WBAT      Mobility  Bed Mobility Overal bed mobility: Needs Assistance Bed Mobility: Supine to Sit     Supine to sit: Min assist;Mod assist;+2 for physical assistance;+2 for safety/equipment     General bed mobility comments: cues for sequence and use of L LE to self assist  Transfers Overall transfer level: Needs assistance Equipment used: Rolling walker (2 wheeled) Transfers: Sit to/from Stand Sit to Stand: Min assist;Mod assist;+2 safety/equipment;From elevated surface         General transfer comment: cues for LE management and use of UEs to self assist  Ambulation/Gait Ambulation/Gait assistance: Min assist;+2 physical assistance;+2 safety/equipment Gait Distance (Feet): 10 Feet Assistive device: Rolling walker (2 wheeled) Gait Pattern/deviations: Step-to pattern;Decreased step length - right;Decreased step length - left;Shuffle;Trunk flexed Gait velocity: decr   General Gait Details: cues for sequence, posture and position from [employer]             Wheelchair Mobility    Modified Rankin (Stroke Patients Only)       Balance Overall balance assessment: Needs assistance Sitting-balance support: No upper extremity supported;Feet supported Sitting balance-Leahy Scale: Fair     Standing balance support: Bilateral upper extremity supported Standing balance-Leahy Scale: Poor                               Pertinent Vitals/Pain Pain Assessment: 0-10 Pain Score: 5  Pain Location: R hip Pain Descriptors / Indicators: Aching;Sore Pain Intervention(s): Limited activity within patient's tolerance;Monitored during session;Premedicated before session;Ice applied    Home Living Family/patient expects to be discharged to:: Private residence Living Arrangements: Children   Type of Home: Mobile home Home Access: Stairs to enter Entrance Stairs-Rails: Psychiatric nurse of Steps: 4 Home Layout: One level Home Equipment: Environmental consultant - 2 wheels;Cane - single point;Bedside commode;Crutches      Prior Function Level of Independence: Independent with assistive device(s)         Comments: Using RW     Hand Dominance        Extremity/Trunk Assessment   Upper Extremity Assessment Upper Extremity Assessment: Overall WFL for tasks assessed    Lower Extremity Assessment Lower Extremity Assessment: RLE deficits/detail RLE Deficits / Details: 2/5 strength at hip with AAROM at hip to 75 flex and 15 abd       Communication   Communication: No difficulties  Cognition Arousal/Alertness: Awake/alert Behavior During Therapy: WFL for tasks assessed/performed Overall Cognitive Status: Within Functional Limits for tasks assessed  General Comments      Exercises Total Joint Exercises Ankle Circles/Pumps: AROM;Both;15 reps;Supine Quad Sets: AROM;Both;10 reps;Supine Heel Slides: AAROM;Right;15 reps;Supine Hip ABduction/ADduction: AAROM;Right;15  reps;Supine   Assessment/Plan    PT Assessment Patient needs continued PT services  PT Problem List Decreased strength;Decreased range of motion;Decreased activity tolerance;Decreased balance;Decreased mobility;Decreased knowledge of use of DME;Pain       PT Treatment Interventions DME instruction;Gait training;Stair training;Functional mobility training;Therapeutic activities;Therapeutic exercise;Patient/family education    PT Goals (Current goals can be found in the Care Plan section)  Acute Rehab PT Goals Patient Stated Goal: Regain IND PT Goal Formulation: With patient Time For Goal Achievement: 04/18/19 Potential to Achieve Goals: Good    Frequency 7X/week   Barriers to discharge        Co-evaluation               AM-PAC PT "6 Clicks" Mobility  Outcome Measure Help needed turning from your back to your side while in a flat bed without using bedrails?: A Lot Help needed moving from lying on your back to sitting on the side of a flat bed without using bedrails?: A Lot Help needed moving to and from a bed to a chair (including a wheelchair)?: A Lot Help needed standing up from a chair using your arms (e.g., wheelchair or bedside chair)?: A Lot Help needed to walk in hospital room?: A Lot Help needed climbing 3-5 steps with a railing? : A Lot 6 Click Score: 12    End of Session Equipment Utilized During Treatment: Gait belt Activity Tolerance: Patient limited by fatigue Patient left: in chair;with call bell/phone within reach;with chair alarm set Nurse Communication: Mobility status PT Visit Diagnosis: Difficulty in walking, not elsewhere classified (R26.2)    Time: 1000-1030 PT Time Calculation (min) (ACUTE ONLY): 30 min   Charges:   PT Evaluation $PT Eval Low Complexity: 1 Low PT Treatments $Therapeutic Exercise: 8-22 mins        Debe Coder PT Acute Rehabilitation Services Pager [phone] Office [phone]   Lien Lyman 04/04/2019,  2:32 PM

## 2019-04-04 NOTE — Plan of Care (Signed)
Poc continued

## 2019-04-05 ENCOUNTER — Encounter (HOSPITAL_COMMUNITY): Payer: Self-pay | Admitting: Orthopedic Surgery

## 2019-04-05 LAB — BASIC METABOLIC PANEL
Anion gap: 8 (ref 5–15)
BUN: 13 mg/dL (ref 8–23)
CO2: 26 mmol/L (ref 22–32)
Calcium: 8.6 mg/dL — ABNORMAL LOW (ref 8.9–10.3)
Chloride: 104 mmol/L (ref 98–111)
Creatinine, Ser: 0.61 mg/dL (ref 0.61–1.24)
GFR calc Af Amer: >60 mL/min (ref >60)
GFR calc non Af Amer: >60 mL/min (ref >60)
Glucose, Bld: 143 mg/dL — ABNORMAL HIGH (ref 70–99)
Potassium: 4.1 mmol/L (ref 3.5–5.1)
Sodium: 138 mmol/L (ref 135–145)

## 2019-04-05 LAB — CBC
HCT: 27.2 % — ABNORMAL LOW (ref 39.0–52.0)
Hemoglobin: 9 g/dL — ABNORMAL LOW (ref 13.0–17.0)
MCH: 34.1 pg — ABNORMAL HIGH (ref 26.0–34.0)
MCHC: 33.1 g/dL (ref 30.0–36.0)
MCV: 103 fL — ABNORMAL HIGH (ref 80.0–100.0)
Platelets: 138 10*3/uL — ABNORMAL LOW (ref 150–400)
RBC: 2.64 MIL/uL — ABNORMAL LOW (ref 4.22–5.81)
RDW: 14.6 % (ref 11.5–15.5)
WBC: 12 10*3/uL — ABNORMAL HIGH (ref 4.0–10.5)
nRBC: 0 % (ref 0.0–0.2)

## 2019-04-05 LAB — GLUCOSE, CAPILLARY
Glucose-Capillary: 139 mg/dL — ABNORMAL HIGH (ref 70–99)
Glucose-Capillary: 122 mg/dL — ABNORMAL HIGH (ref 70–99)

## 2019-04-05 MED ORDER — METHOCARBAMOL 500 MG PO TABS: 500.0000 mg | ORAL_TABLET | Freq: Four times a day (QID) | ORAL | 0 refills | Status: DC | PRN

## 2019-04-05 MED ORDER — ASPIRIN 325 MG PO TBEC: 325.0000 mg | DELAYED_RELEASE_TABLET | Freq: Two times a day (BID) | ORAL | 0 refills | Status: AC

## 2019-04-05 NOTE — Progress Notes (Signed)
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, CHF, [name]    PT Comments    Pt continues steady progress with mobility.  Pt dtr present to review THP, stairs and car transfers.  Follow Up Recommendations  Home health PT;Follow surgeon's recommendation for DC plan and follow-up therapies     Equipment Recommendations  None recommended by PT    Recommendations for Other Services OT consult     Precautions / Restrictions Precautions Precautions: Posterior Hip;Fall Precaution Booklet Issued: Yes (comment) Precaution Comments: reviewed posterior hip precautions with pt and daughter Restrictions Weight Bearing Restrictions: No RLE Weight Bearing: Weight bearing as tolerated    Mobility  Bed Mobility Overal bed mobility: Needs Assistance Bed Mobility: Supine to Sit     Supine to sit: Min assist     General bed mobility comments: Pt up in chair and requested back to same  Transfers Overall transfer level: Needs assistance Equipment used: Rolling walker (2 wheeled) Transfers: Sit to/from Stand Sit to Stand: Min guard         General transfer comment: cues for LE management and use of UEs to self assist  Ambulation/Gait Ambulation/Gait assistance: Min assist;Min guard Gait Distance (Feet): 40 Feet Assistive device: Rolling walker (2 wheeled) Gait Pattern/deviations: Step-to pattern;Decreased step length - right;Decreased step length - left;Shuffle;Trunk flexed Gait velocity: decr   General Gait Details: cues for sequence, posture and position from RW   Stairs Stairs: Yes Stairs assistance: Min assist Stair Management: Two rails;Step to pattern;Forwards Number of Stairs: 3 General stair comments: cues for sequence   Wheelchair Mobility    Modified Rankin  (Stroke Patients Only)       Balance Overall balance assessment: Needs assistance Sitting-balance support: No upper extremity supported;Feet supported Sitting balance-Leahy Scale: Good     Standing balance support: Bilateral upper extremity supported Standing balance-Leahy Scale: Fair                              Cognition Arousal/Alertness: Awake/alert Behavior During Therapy: WFL for tasks assessed/performed Overall Cognitive Status: Within Functional Limits for tasks assessed                                 General Comments: emotional lability at times      Exercises Total Joint Exercises Ankle Circles/Pumps: AROM;Both;15 reps;Supine Quad Sets: AROM;Both;10 reps;Supine Heel Slides: AAROM;Right;15 reps;Supine Hip ABduction/ADduction: AAROM;Right;15 reps;Supine    General Comments        Pertinent Vitals/Pain Pain Assessment: 0-10 Pain Score: 6  Pain Location: R hip Pain Descriptors / Indicators: Aching;Sore Pain Intervention(s): Limited activity within patient's tolerance;Monitored during session;Premedicated before session;Ice applied    Home Living Family/patient expects to be discharged to:: Private residence Living Arrangements: Alone Available Help at Discharge: Family;Available 24 hours/day Type of Home: Mobile home Home Access: Stairs to enter Entrance Stairs-Rails: Right;Left Home Layout: One level Home Equipment: Environmental consultant - 2 wheels;Cane - single point;Bedside commode;Crutches      Prior Function Level of Independence: Independent with assistive device(s)      Comments: Using RW   PT Goals (current goals can now be found in the care plan section) Acute Rehab PT  Goals Patient Stated Goal: Regain IND PT Goal Formulation: With patient Time For Goal Achievement: 04/18/19 Potential to Achieve Goals: Good Progress towards PT goals: Progressing toward goals    Frequency    7X/week      PT Plan Current plan remains  appropriate    Co-evaluation              AM-PAC PT "6 Clicks" Mobility   Outcome Measure  Help needed turning from your back to your side while in a flat bed without using bedrails?: A Lot Help needed moving from lying on your back to sitting on the side of a flat bed without using bedrails?: A Little Help needed moving to and from a bed to a chair (including a wheelchair)?: A Little Help needed standing up from a chair using your arms (e.g., wheelchair or bedside chair)?: A Little Help needed to walk in hospital room?: A Little Help needed climbing 3-5 steps with a railing? : A Little 6 Click Score: 17    End of Session Equipment Utilized During Treatment: Gait belt Activity Tolerance: Patient limited by fatigue;Patient limited by pain;Patient tolerated treatment well Patient left: in chair;with call bell/phone within reach;with chair alarm set;with family/visitor present Nurse Communication: Mobility status PT Visit Diagnosis: Difficulty in walking, not elsewhere classified (R26.2)     Time: 9449-6759 PT Time Calculation (min) (ACUTE ONLY): 28 min  Charges:  $Gait Training: 8-22 mins $Therapeutic Exercise: 8-22 mins $Therapeutic Activity: 8-22 mins                     [place] Pager [phone] Office [phone]    Taimane Stimmel 04/05/2019, 2:54 PM

## 2019-04-05 NOTE — Progress Notes (Signed)
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, CHF, [name]    PT Comments    Pt with marked improvement in activity tolerance this am.  Pt limited by back pain but hopeful for dc home this pm.  Will review stairs when dtr is available.   Follow Up Recommendations  Home health PT;Follow surgeon's recommendation for DC plan and follow-up therapies     Equipment Recommendations  None recommended by PT    Recommendations for Other Services OT consult     Precautions / Restrictions Precautions Precautions: Posterior Hip;Fall Precaution Booklet Issued: Yes (comment) Precaution Comments: Pt recalls 1/3 THP without cues.  All THP reviewed Restrictions RLE Weight Bearing: Weight bearing as tolerated    Mobility  Bed Mobility Overal bed mobility: Needs Assistance Bed Mobility: Supine to Sit     Supine to sit: Min assist     General bed mobility comments: cues for sequence and adherence to THP  Transfers Overall transfer level: Needs assistance Equipment used: Rolling walker (2 wheeled) Transfers: Sit to/from Stand Sit to Stand: Min assist         General transfer comment: cues for LE management and use of UEs to self assist  Ambulation/Gait Ambulation/Gait assistance: Min assist;Min guard Gait Distance (Feet): 40 Feet Assistive device: Rolling walker (2 wheeled) Gait Pattern/deviations: Step-to pattern;Decreased step length - right;Decreased step length - left;Shuffle;Trunk flexed Gait velocity: decr   General Gait Details: cues for sequence, posture and position from RW; distance ltd by back pain   Stairs             Wheelchair Mobility    Modified Rankin (Stroke Patients Only)       Balance Overall balance assessment: Needs  assistance Sitting-balance support: No upper extremity supported;Feet supported Sitting balance-Leahy Scale: Good     Standing balance support: Bilateral upper extremity supported Standing balance-Leahy Scale: Poor                              Cognition Arousal/Alertness: Awake/alert Behavior During Therapy: WFL for tasks assessed/performed Overall Cognitive Status: Within Functional Limits for tasks assessed                                        Exercises Total Joint Exercises Ankle Circles/Pumps: AROM;Both;15 reps;Supine Quad Sets: AROM;Both;10 reps;Supine Heel Slides: AAROM;Right;15 reps;Supine Hip ABduction/ADduction: AAROM;Right;15 reps;Supine    General Comments        Pertinent Vitals/Pain Pain Assessment: 0-10 Pain Score: 6  Pain Location: R hip Pain Descriptors / Indicators: Aching;Sore Pain Intervention(s): Limited activity within patient's tolerance;Monitored during session;Premedicated before session;Ice applied    Home Living                      Prior Function            PT Goals (current goals can now be found in the care plan section) Acute Rehab PT Goals Patient Stated Goal: Regain IND PT Goal Formulation: With patient Time For Goal Achievement: 04/18/19 Potential to Achieve Goals: Good Progress towards PT goals: Progressing toward goals    Frequency    7X/week  PT Plan Current plan remains appropriate    Co-evaluation              AM-PAC PT "6 Clicks" Mobility   Outcome Measure  Help needed turning from your back to your side while in a flat bed without using bedrails?: A Lot Help needed moving from lying on your back to sitting on the side of a flat bed without using bedrails?: A Lot Help needed moving to and from a bed to a chair (including a wheelchair)?: A Little Help needed standing up from a chair using your arms (e.g., wheelchair or bedside chair)?: A Little Help needed to walk  in hospital room?: A Little Help needed climbing 3-5 steps with a railing? : A Lot 6 Click Score: 15    End of Session Equipment Utilized During Treatment: Gait belt Activity Tolerance: Patient limited by fatigue;Patient limited by pain;Patient tolerated treatment well Patient left: in chair;with call bell/phone within reach;with chair alarm set Nurse Communication: Mobility status PT Visit Diagnosis: Difficulty in walking, not elsewhere classified (R26.2)     Time: 1021-1050 PT Time Calculation (min) (ACUTE ONLY): 29 min  Charges:  $Gait Training: 8-22 mins $Therapeutic Exercise: 8-22 mins                     [place] Pager [phone] Office [phone]    Kessler Kopinski 04/05/2019, 1:01 PM

## 2019-04-05 NOTE — Progress Notes (Signed)
Subjective: 2 Days Post-Op Procedure(s) (LRB): Conversion of previous hip surgery to right total hip arthroplasty (Right) Patient reports pain as moderate.   Patient seen in rounds with Dr. Wynelle Link. Patient is well, and has had no acute complaints or problems other than pain in the hip and back. Denies chest pain or SOB. Voiding without difficulty. States he is ready to go home. Plan is to go Home after hospital stay.  Objective: Vital signs in last 24 hours: Temp:  [98.2 F (36.8 C)-98.6 F (37 C)] 98.5 F (36.9 C) (08/14 0611) Pulse Rate:  [58-64] 58 (08/14 0611) Resp:  [16-18] 16 (08/14 0611) BP: (97-148)/(55-69) 148/69 (08/14 0611) SpO2:  [90 %-95 %] 95 % (08/14 0611)  Intake/Output from previous day:  Intake/Output Summary (Last 24 hours) at 04/05/2019 0749 Last data filed at 04/05/2019 0600 Gross per 24 hour  Intake 2024.95 ml  Output 1675 ml  Net 349.95 ml   Labs: Recent Labs    04/04/19 0553 04/05/19 0314  HGB 10.1* 9.0*   Recent Labs    04/04/19 0553 04/05/19 0314  WBC 11.2* 12.0*  RBC 2.91* 2.64*  HCT 29.0* 27.2*  PLT 164 138*   Recent Labs    04/04/19 0553 04/05/19 0314  NA 137 138  K 4.0 4.1  CL 102 104  CO2 27 26  BUN 8 13  CREATININE 0.64 0.61  GLUCOSE 170* 143*  CALCIUM 8.3* 8.6*   Exam: General - Patient is Alert and Oriented Extremity - Neurologically intact Neurovascular intact Sensation intact distally Dorsiflexion/Plantar flexion intact Dressing/Incision - clean, dry, no drainage Motor Function - intact, moving foot and toes well on exam.   Past Medical History:  Diagnosis Date  . ALLERGIC RHINITIS 05/02/2007  . C. difficile diarrhea   . CAD (coronary artery disease)    s/p DES to LAD and LCX in 2005 // [place] in 2009 - stents patent // nuc stress test 7/17 - no ischemia, EF 56 // rare anginal pain  . Cataract   . Chronic diastolic CHF (congestive heart failure) ([place])    Echo 9/19: mild focal basal septal hypertrophy, EF  55-60, no RWMA, Gr 1 DD, mildly dilated ascending aorta (38 mm), normal RVSF, mild TR, PASP 20, GLS - 19.9 (normal)  . DEPRESSION 10/27/2009  . DIABETES MELLITUS, TYPE II 05/02/2007  . DUODENITIS 12/10/2007  . ESOPHAGEAL STRICTURE 12/10/2007  . Gall stones   . GERD 12/10/2007  . Glaucoma   . H/O hiatal hernia   . Heart murmur    Echo 7/17: EF 55-60, normal diast fxn // Echo 1/16: mild LVH, EF 55-60, mild MR, mild LAE  . HYPERCHOLESTEROLEMIA 10/27/2009  . HYPERTENSION 05/02/2007  . Insomnia   . Migraines    YEARS AGO  . Myocardial infarction ([place]) 2005  . Neuromuscular disorder ([place])    pt unaware  . OSTEOARTHRITIS, LUMBAR SPINE 10/27/2009  . Pneumonia   . TRANSAMINASES, SERUM, ELEVATED 12/10/2007    Assessment/Plan: 2 Days Post-Op Procedure(s) (LRB): Conversion of previous hip surgery to right total hip arthroplasty (Right) Active Problems:   OA (osteoarthritis) of hip  Estimated body mass index is 28.63 kg/m as calculated from the following:   Height as of this encounter: 5\' 4"  (1.626 m).   Weight as of this encounter: 75.7 kg. Up with therapy D/C IV fluids  DVT Prophylaxis - Aspirin Weight-bearing as tolerated  Could potentially discharge later today if progresses with therapy and meeting his goals. Order placed for HHPT Follow-up in  the office in 2 weeks.   Theresa Duty, PA-C Orthopedic Surgery 04/05/2019, 7:49 AM

## 2019-04-05 NOTE — TOC Transition Note (Signed)
Transition of Care [facility]) - CM/SW Discharge Note   Patient Details  Name: [name] MRN: [id number] Date of Birth: 1946-07-09  Transition of Care [facility]) CM/SW Contact:  Leeroy Cha, RN Phone Number: 04/05/2019, 11:24 AM   Clinical Narrative:    Discharged to home with equipment and hhc through [facility]   Final next level of care: [place] Barriers to Discharge: No Barriers Identified   Patient Goals and CMS Choice Patient states their goals for this hospitalization and ongoing recovery are:: ljust to go home CMS Medicare.gov Compare Post Acute Care list provided to:: Patient Choice offered to / list presented to : Patient  Discharge Placement                       Discharge Plan and Services   Discharge Planning Services: CM Consult Post Acute Care Choice: Durable Medical Equipment, Home Health          DME Arranged: Walker rolling, 3-N-1 DME Agency: [employer] Date DME Agency Contacted: 04/05/19 Time DME Agency Contacted: 0900 Representative spoke with at DME Agency: nathan HH Arranged: PT [place]: [facility] [employer] [facility] [employer] Date [place]: 04/05/19 Time [place]: 0900 Representative spoke with at [place]: mike  Social Determinants of Health ([place]) Interventions     Readmission Risk Interventions No flowsheet data found.

## 2019-04-05 NOTE — Care Management Important Message (Signed)
Important Message  Patient Details IM Letter given to Velva Harman RN to present to the Patient Name: [name] MRN: [id number] Date of Birth: 1946/01/20   Medicare Important Message Given:  Yes     Kerin Salen 04/05/2019, 10:48 AM

## 2019-04-05 NOTE — Evaluation (Signed)
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, CHF, [name]   Clinical Impression   Pt was ambulating with a RW and assisted for IADL, but modified independent in self care prior to admission. Educated pt and daughter at length in posterior hip precautions related to ADL, home safety and use of AE. Pt and daughter verbalizing and/or demonstrating understanding. No further OT needs. Daughter and son in law plan to move in with pt for as long as necessary.     Follow Up Recommendations  Follow surgeon's recommendation for DC plan and follow-up therapies    Equipment Recommendations  None recommended by OT    Recommendations for Other Services       Precautions / Restrictions Precautions Precautions: Posterior Hip;Fall Precaution Booklet Issued: Yes (comment) Precaution Comments: reviewed posterior hip precautions with pt and daughter Restrictions Weight Bearing Restrictions: Yes RLE Weight Bearing: Weight bearing as tolerated      Mobility Bed Mobility      General bed mobility comments: pt received in chair  Transfers Overall transfer level: Needs assistance Equipment used: Rolling walker (2 wheeled) Transfers: Sit to/from Stand Sit to Stand: Min assist         General transfer comment: cues for LE management and use of UEs to self assist    Balance Overall balance assessment: Needs assistance Sitting-balance support: No upper extremity supported;Feet supported Sitting balance-Leahy Scale: Good     Standing balance support: Bilateral upper extremity supported Standing balance-Leahy Scale: Poor                             ADL either performed or assessed with clinical judgement   ADL Overall ADL's : Needs assistance/impaired Eating/Feeding:  Independent;Sitting   Grooming: Wash/dry hands;Wash/dry face;Sitting;Set up   Upper Body Bathing: Minimal assistance;Sitting Upper Body Bathing Details (indicate cue type and reason): recommended long handled bath sponge for back Lower Body Bathing: Maximal assistance;Sit to/from stand Lower Body Bathing Details (indicate cue type and reason): recommended use of long handled bath sponge and reacher to wash and dry feet Upper Body Dressing : Set up;Sitting   Lower Body Dressing: Maximal assistance;Sit to/from stand Lower Body Dressing Details (indicate cue type and reason): instructed in use of AE for LB dressing and to dress operated leg first Toilet Transfer: Minimal assistance;Ambulation;BSC;RW Toilet Transfer Details (indicate cue type and reason): family is aware 3 in 1 can be used to elevate toilet Toileting- Clothing Manipulation and Hygiene: Minimal assistance;Sit to/from Nurse, children's Details (indicate cue type and reason): educated in options for tub transfer adhering to hip precautions   General ADL Comments: pt has a walker basket at home, educated in safe footwear     Vision Baseline Vision/History: Wears glasses Wears Glasses: At all times Patient Visual Report: No change from baseline       Perception     Praxis      Pertinent Vitals/Pain Pain Assessment: 0-10 Pain Score: 9  Pain Location: R hip Pain Descriptors / Indicators: Aching;Sore Pain Intervention(s): Monitored during session;Repositioned;Premedicated before session;Ice applied     Hand Dominance Right   Extremity/Trunk Assessment Upper Extremity Assessment Upper Extremity Assessment: Overall WFL for tasks assessed(arthritic changes in hands)  Lower Extremity Assessment Lower Extremity Assessment: Defer to PT evaluation       Communication Communication Communication: No difficulties   Cognition Arousal/Alertness: Awake/alert Behavior During Therapy: WFL for tasks  assessed/performed Overall Cognitive Status: Within Functional Limits for tasks assessed                                 General Comments: emotional lability at times   General Comments       Exercises Exercises: Total Joint Total Joint Exercises Ankle Circles/Pumps: AROM;Both;15 reps;Supine Quad Sets: AROM;Both;10 reps;Supine Heel Slides: AAROM;Right;15 reps;Supine Hip ABduction/ADduction: AAROM;Right;15 reps;Supine   Shoulder Instructions      Home Living Family/patient expects to be discharged to:: Private residence Living Arrangements: Alone Available Help at Discharge: Family;Available 24 hours/day Type of Home: Mobile home Home Access: Stairs to enter Entrance Stairs-Number of Steps: 3 Entrance Stairs-Rails: Right;Left Home Layout: One level     Bathroom Shower/Tub: Teacher, early years/pre: Standard     Home Equipment: Environmental consultant - 2 wheels;Cane - single point;Bedside commode;Crutches          Prior Functioning/Environment Level of Independence: Independent with assistive device(s)        Comments: Using RW        OT Problem List:        OT Treatment/Interventions:      OT Goals(Current goals can be found in the care plan section) Acute Rehab OT Goals Patient Stated Goal: Regain IND  OT Frequency:     Barriers to D/C:            Co-evaluation              AM-PAC OT "6 Clicks" Daily Activity     Outcome Measure Help from another person eating meals?: None Help from another person taking care of personal grooming?: A Little Help from another person toileting, which includes using toliet, bedpan, or urinal?: A Little Help from another person bathing (including washing, rinsing, drying)?: A Lot Help from another person to put on and taking off regular upper body clothing?: A Little Help from another person to put on and taking off regular lower body clothing?: A Lot 6 Click Score: 17   End of Session Equipment  Utilized During Treatment: Gait belt;Rolling walker  Activity Tolerance: Patient tolerated treatment well Patient left: in chair;with call bell/phone within reach;with family/visitor present  OT Visit Diagnosis: Unsteadiness on feet (R26.81);Other abnormalities of gait and mobility (R26.89);Pain                Time: 1101-1136 OT Time Calculation (min): 35 min Charges:  OT General Charges $OT Visit: 1 Visit OT Evaluation $OT Eval Moderate Complexity: 1 Mod OT Treatments $Self Care/Home Management : 8-22 mins  Nestor Lewandowsky, OTR/L Acute Rehabilitation Services Pager: [phone] Office: [phone]  Malka So 04/05/2019, 1:24 PM

## 2019-04-08 NOTE — Discharge Summary (Signed)
Physician Discharge Summary   Patient ID: [name] MRN: [id number] DOB/AGE: 71/02/1946 71 y.o.  Admit date: 04/03/2019 Discharge date: 04/05/2019  Primary Diagnosis: Nonunion right intertrochanteric femur fracture   Admission Diagnoses:  Past Medical History:  Diagnosis Date   ALLERGIC RHINITIS 05/02/2007   C. difficile diarrhea    CAD (coronary artery disease)    s/p DES to LAD and LCX in 2005 // [place] in 2009 - stents patent // nuc stress test 7/17 - no ischemia, EF 56 // rare anginal pain   Cataract    Chronic diastolic CHF (congestive heart failure) ([place])    Echo 9/19: mild focal basal septal hypertrophy, EF 55-60, no RWMA, Gr 1 DD, mildly dilated ascending aorta (38 mm), normal RVSF, mild TR, PASP 20, GLS - 19.9 (normal)   DEPRESSION 10/27/2009   DIABETES MELLITUS, TYPE II 05/02/2007   DUODENITIS 12/10/2007   ESOPHAGEAL STRICTURE 12/10/2007   Gall stones    GERD 12/10/2007   Glaucoma    H/O hiatal hernia    Heart murmur    Echo 7/17: EF 55-60, normal diast fxn // Echo 1/16: mild LVH, EF 55-60, mild MR, mild LAE   HYPERCHOLESTEROLEMIA 10/27/2009   HYPERTENSION 05/02/2007   Insomnia    Migraines    YEARS AGO   Myocardial infarction [facility]) 2005   Neuromuscular disorder ([place])    pt unaware   OSTEOARTHRITIS, LUMBAR SPINE 10/27/2009   Pneumonia    TRANSAMINASES, SERUM, ELEVATED 12/10/2007   Discharge Diagnoses:   Active Problems:   OA (osteoarthritis) of hip  Estimated body mass index is 28.63 kg/m as calculated from the following:   Height as of this encounter: 5\' 4"  (1.626 m).   Weight as of this encounter: 75.7 kg.  Procedure:  Procedure(s) (LRB): Conversion of previous hip surgery to right total hip arthroplasty (Right)   Consults: None  HPI: The patient is a 73 year old male who had an intertrochanteric right femur fracture approximately a year ago.  He has had persistent worsening pain over the past few months.  An x-ray and CT were  consistent with a nonunion of this fracture.  He presents now for hardware removal and conversion to a total hip arthroplasty.  Laboratory Data: Admission on 04/03/2019, Discharged on 04/05/2019  Component Date Value Ref Range Status   Glucose-Capillary 04/03/2019 134* 70 - 99 mg/dL Final   Glucose-Capillary 04/03/2019 174* 70 - 99 mg/dL Final   WBC 04/04/2019 11.2* 4.0 - 10.5 K/uL Final   RBC 04/04/2019 2.91* 4.22 - 5.81 MIL/uL Final   Hemoglobin 04/04/2019 10.1* 13.0 - 17.0 g/dL Final   HCT 04/04/2019 29.0* 39.0 - 52.0 % Final   MCV 04/04/2019 99.7  80.0 - 100.0 fL Final   MCH 04/04/2019 34.7* 26.0 - 34.0 pg Final   MCHC 04/04/2019 34.8  30.0 - 36.0 g/dL Final   RDW 04/04/2019 13.9  11.5 - 15.5 % Final   Platelets 04/04/2019 164  150 - 400 K/uL Final   nRBC 04/04/2019 0.2  0.0 - 0.2 % Final   Performed at [facility], [place] [address], [place], [place] [address]   Sodium 04/04/2019 137  135 - 145 mmol/L Final   Potassium 04/04/2019 4.0  3.5 - 5.1 mmol/L Final   Chloride 04/04/2019 102  98 - 111 mmol/L Final   CO2 04/04/2019 27  22 - 32 mmol/L Final   Glucose, Bld 04/04/2019 170* 70 - 99 mg/dL Final   BUN 04/04/2019 8  8 - 23 mg/dL  Final   Creatinine, Ser 04/04/2019 0.64  0.61 - 1.24 mg/dL Final   Calcium 04/04/2019 8.3* 8.9 - 10.3 mg/dL Final   GFR calc non Af Amer 04/04/2019 >60  >60 mL/min Final   GFR calc Af Amer 04/04/2019 >60  >60 mL/min Final   Anion gap 04/04/2019 8  5 - 15 Final   Performed at [facility], [place] [address], [place], [place] [address]   Glucose-Capillary 04/03/2019 203* 70 - 99 mg/dL Final   Comment 1 04/03/2019 Notify RN   Final   Comment 2 04/03/2019 Document in Chart   Final   Glucose-Capillary 04/04/2019 149* 70 - 99 mg/dL Final   Glucose-Capillary 04/04/2019 226* 70 - 99 mg/dL Final   Glucose-Capillary 04/04/2019 191* 70 - 99 mg/dL Final   WBC 04/05/2019 12.0* 4.0 - 10.5 K/uL Final    RBC 04/05/2019 2.64* 4.22 - 5.81 MIL/uL Final   Hemoglobin 04/05/2019 9.0* 13.0 - 17.0 g/dL Final   HCT 04/05/2019 27.2* 39.0 - 52.0 % Final   MCV 04/05/2019 103.0* 80.0 - 100.0 fL Final   MCH 04/05/2019 34.1* 26.0 - 34.0 pg Final   MCHC 04/05/2019 33.1  30.0 - 36.0 g/dL Final   RDW 04/05/2019 14.6  11.5 - 15.5 % Final   Platelets 04/05/2019 138* 150 - 400 K/uL Final   nRBC 04/05/2019 0.0  0.0 - 0.2 % Final   Performed at [facility], [place] [address] [place], [place] [address]   Sodium 04/05/2019 138  135 - 145 mmol/L Final   Potassium 04/05/2019 4.1  3.5 - 5.1 mmol/L Final   Chloride 04/05/2019 104  98 - 111 mmol/L Final   CO2 04/05/2019 26  22 - 32 mmol/L Final   Glucose, Bld 04/05/2019 143* 70 - 99 mg/dL Final   BUN 04/05/2019 13  8 - 23 mg/dL Final   Creatinine, Ser 04/05/2019 0.61  0.61 - 1.24 mg/dL Final   Calcium 04/05/2019 8.6* 8.9 - 10.3 mg/dL Final   GFR calc non Af Amer 04/05/2019 >60  >60 mL/min Final   GFR calc Af Amer 04/05/2019 >60  >60 mL/min Final   Anion gap 04/05/2019 8  5 - 15 Final   Performed at [facility], [place] [address] [place], [place] [address]   Glucose-Capillary 04/04/2019 144* 70 - 99 mg/dL Final   Glucose-Capillary 04/05/2019 139* 70 - 99 mg/dL Final   Glucose-Capillary 04/05/2019 122* 70 - 99 mg/dL Final  Hospital Outpatient Visit on 03/30/2019  Component Date Value Ref Range Status   SARS Coronavirus 2 03/30/2019 NEGATIVE  NEGATIVE Final   Comment: (NOTE) SARS-CoV-2 target nucleic acids are NOT DETECTED. The SARS-CoV-2 RNA is generally detectable in upper and lower respiratory specimens during the acute phase of infection. Negative results do not preclude SARS-CoV-2 infection, do not rule out co-infections with other pathogens, and should not be used as the sole basis for treatment or other patient management decisions. Negative results must be combined with clinical  observations, patient history, and epidemiological information. The expected result is Negative. Fact Sheet for Patients: SugarRoll.be Fact Sheet for Healthcare Providers: https://www.woods-mathews.com/ This test is not yet approved or cleared by the [place] FDA and  has been authorized for detection and/or diagnosis of SARS-CoV-2 by FDA under an Emergency Use Authorization (EUA). This EUA will remain  in effect (meaning this test can be used) for the duration of the COVID-19 declaration under Section 56  4(b)(1) of the Act, 21 U.S.C. section 360bbb-3(b)(1), unless the authorization is terminated or revoked sooner. Performed at [facility], [place] [address] [place], [place] [address]   Hospital Outpatient Visit on 03/28/2019  Component Date Value Ref Range Status   aPTT 03/28/2019 27  24 - 36 seconds Final   Performed at [facility], [place] [address] [place], [place] [address]   WBC 03/28/2019 7.6  4.0 - 10.5 K/uL Final   RBC 03/28/2019 3.92* 4.22 - 5.81 MIL/uL Final   Hemoglobin 03/28/2019 13.2  13.0 - 17.0 g/dL Final   HCT 03/28/2019 39.5  39.0 - 52.0 % Final   MCV 03/28/2019 100.8* 80.0 - 100.0 fL Final   MCH 03/28/2019 33.7  26.0 - 34.0 pg Final   MCHC 03/28/2019 33.4  30.0 - 36.0 g/dL Final   RDW 03/28/2019 14.2  11.5 - 15.5 % Final   Platelets 03/28/2019 174  150 - 400 K/uL Final   nRBC 03/28/2019 0.0  0.0 - 0.2 % Final   Performed at [facility], [place] [address], [place], [place] [address]   Sodium 03/28/2019 140  135 - 145 mmol/L Final   Potassium 03/28/2019 3.8  3.5 - 5.1 mmol/L Final   Chloride 03/28/2019 104  98 - 111 mmol/L Final   CO2 03/28/2019 27  22 - 32 mmol/L Final   Glucose, Bld 03/28/2019 133* 70 - 99 mg/dL Final   BUN 03/28/2019 9  8 - 23 mg/dL Final   Creatinine, Ser 03/28/2019 0.70  0.61 - 1.24 mg/dL Final   Calcium  03/28/2019 8.9  8.9 - 10.3 mg/dL Final   Total Protein 03/28/2019 7.0  6.5 - 8.1 g/dL Final   Albumin 03/28/2019 3.9  3.5 - 5.0 g/dL Final   AST 03/28/2019 19  15 - 41 U/L Final   ALT 03/28/2019 17  0 - 44 U/L Final   Alkaline Phosphatase 03/28/2019 79  38 - 126 U/L Final   Total Bilirubin 03/28/2019 0.8  0.3 - 1.2 mg/dL Final   GFR calc non Af Amer 03/28/2019 >60  >60 mL/min Final   GFR calc Af Amer 03/28/2019 >60  >60 mL/min Final   Anion gap 03/28/2019 9  5 - 15 Final   Performed at [facility], [place] [address] [place], [place] [address]   Prothrombin Time 03/28/2019 13.1  11.4 - 15.2 seconds Final   INR 03/28/2019 1.0  0.8 - 1.2 Final   Comment: (NOTE) INR goal varies based on device and disease states. Performed at [facility], [place] [address], [place], [place] [address]    ABO/RH(D) 03/28/2019 A POS   Final   Antibody Screen 03/28/2019 NEG   Final   Sample Expiration 03/28/2019 04/06/2019,2359   Final   Extend sample reason 03/28/2019    Final                   Value:NO TRANSFUSIONS OR PREGNANCY IN THE PAST 3 MONTHS Performed at [place] [address] [place],  [address]    MRSA, PCR 03/28/2019 NEGATIVE  NEGATIVE Final   Staphylococcus aureus 03/28/2019 NEGATIVE  NEGATIVE Final   Comment: (NOTE) The Xpert SA Assay (FDA approved for NASAL specimens in patients 33 years of age and older), is one component of a comprehensive surveillance program. It is not intended to diagnose infection nor to guide or monitor treatment. Performed at [facility], [place] [address] [place],  [place] [address]    Hgb A1c MFr Bld 03/28/2019 6.6* 4.8 - 5.6 % Final   Comment: (NOTE) Pre diabetes:          5.7%-6.4% Diabetes:              >6.4% Glycemic control for   <7.0% adults with diabetes    Mean Plasma Glucose 03/28/2019 142.72  mg/dL Final   Performed at [facility],  [place] [address] [place], [place] [address]   Glucose-Capillary 03/28/2019 136* 70 - 99 mg/dL Final   ABO/RH(D) 03/28/2019    Final                   Value:A POS Performed at [facility], [place] [address] [place], [place] [address]   Appointment on 02/27/2019  Component Date Value Ref Range Status   Rest HR 02/27/2019 50  bpm Final   Rest BP 02/27/2019 142/64  mmHg Final   Peak HR 02/27/2019 71  bpm Final   Peak BP 02/27/2019 164/70  mmHg Final   SSS 02/27/2019 2   Final   SRS 02/27/2019 1   Final   SDS 02/27/2019 1   Final   TID 02/27/2019 1.08   Final   LV sys vol 02/27/2019 37  mL Final   LV dias vol 02/27/2019 101  62 - 150 mL Final     X-Rays:Dg Pelvis Portable  Result Date: 04/03/2019 CLINICAL DATA:  Hip replacement EXAM: PORTABLE PELVIS 1-2 VIEWS COMPARISON:  January 31, 2018 FINDINGS: The patient has undergone total hip arthroplasty on the right. The alignment appears near anatomic. There are expected postsurgical changes including subcutaneous gas and overlying soft tissue edema. Skin staples are noted. There is a surgical drain in place. The patient's previously noted intramedullary nail has been explanted. IMPRESSION: Expected postsurgical changes related to total hip arthroplasty on the right. Electronically Signed   By: Constance Holster M.D.   On: 04/03/2019 17:27    EKG: Orders placed or performed in visit on 03/14/19   EKG 12-Lead     Hospital Course: CYLAS FALZONE is a 73 y.o. who was admitted to [facility]. They were brought to the operating room on 04/03/2019 and underwent Procedure(s): Conversion of previous hip surgery to right total hip arthroplasty.  Patient tolerated the procedure well and was later transferred to the recovery room and then to the orthopaedic floor for postoperative care. They were given PO and IV analgesics for pain control following their surgery. They were given 24 hours of postoperative antibiotics of   Anti-infectives (From admission, onward)   Start     Dose/Rate Route Frequency Ordered Stop   04/03/19 1900  ceFAZolin (ANCEF) IVPB 2g/100 mL premix     2 g 200 mL/hr over 30 Minutes Intravenous Every 6 hours 04/03/19 1810 04/04/19 0142   04/03/19 1030  ceFAZolin (ANCEF) IVPB 2g/100 mL premix     2 g 200 mL/hr over 30 Minutes Intravenous On call to O.R. 04/03/19 1024 04/03/19 1305     and started on DVT prophylaxis in the form of Aspirin.   PT and OT were ordered for total joint protocol. Discharge planning consulted to help with postop disposition and equipment needs. Patient had a decent night on the evening of surgery. They started to get up OOB with therapy on POD #1. Hemovac drain was pulled without difficulty on day one. Knee immobilizer discontinued. Continued to work with therapy into POD #2. Pt was seen  during rounds on day two and was ready to go home pending progress with therapy. Dressing was changed and the incision was clean, dry, and intact with no drainage. Pt worked with therapy for two additional sessions and was meeting their goals. He was discharged to home later that day in stable condition.  Diet: Cardiac diet Activity: WBAT Hip precautions discussed with patient Follow-up: in 2 weeks Disposition: Home with HHPT Discharged Condition: stable   Discharge Instructions    Call MD / Call 911   Complete by: As directed    If you experience chest pain or shortness of breath, CALL 911 and be transported to the hospital emergency room.  If you develope a fever above 101 F, pus (white drainage) or increased drainage or redness at the wound, or calf pain, call your surgeon's office.   Call MD / Call 911   Complete by: As directed    If you experience chest pain or shortness of breath, CALL 911 and be transported to the hospital emergency room.  If you develope a fever above 101 F, pus (white drainage) or increased drainage or redness at the wound, or calf pain, call your  surgeon's office.   Change dressing   Complete by: As directed    Change the dressing daily with sterile 4 x 4 inch gauze dressing and paper tape.   Change dressing   Complete by: As directed    Change the dressing daily with sterile 4 x 4 inch gauze dressing and paper tape.  You may clean the incision with alcohol prior to redressing   Constipation Prevention   Complete by: As directed    Drink plenty of fluids.  Prune juice may be helpful.  You may use a stool softener, such as Colace (over the counter) 100 mg twice a day.  Use MiraLax (over the counter) for constipation as needed.   Constipation Prevention   Complete by: As directed    Drink plenty of fluids.  Prune juice may be helpful.  You may use a stool softener, such as Colace (over the counter) 100 mg twice a day.  Use MiraLax (over the counter) for constipation as needed.   Diet - low sodium heart healthy   Complete by: As directed    Diet - low sodium heart healthy   Complete by: As directed    Driving restrictions   Complete by: As directed    No driving for 4 weeks   Follow the hip precautions as taught in Physical Therapy   Complete by: As directed    Follow the hip precautions as taught in Physical Therapy   Complete by: As directed    Weight bearing as tolerated   Complete by: As directed    Weight bearing as tolerated   Complete by: As directed      Allergies as of 04/05/2019      Reactions   Bupropion Hcl Other (See Comments)   "messes with my head"   Doxycycline Hyclate Diarrhea       Tetracycline Diarrhea   Verapamil Other (See Comments)   "messes with my head"      Medication List    TAKE these medications   aspirin 325 MG EC tablet Take 1 tablet (325 mg total) by mouth 2 (two) times daily for 19 days. Then resume one 81 mg aspirin once a day. What changed:   medication strength  how much to take  when to take this  additional instructions  atorvastatin 40 MG tablet Commonly known as:  LIPITOR TAKE 1 TABLET BY MOUTH EVERY DAY   busPIRone 5 MG tablet Commonly known as: BUSPAR Take 2.5 mg by mouth 2 (two) times daily.   chlorthalidone 25 MG tablet Commonly known as: HYGROTON Take 1 tablet (25 mg total) by mouth daily. You may take an extra pill if your blood pressure spikes to 160/90 What changed:   when to take this  reasons to take this  additional instructions   FLORAJEN ACIDOPHILUS PO Take 1 capsule by mouth daily.   fluticasone 50 MCG/ACT nasal spray Commonly known as: FLONASE Place 2 sprays into both nostrils at bedtime.   gabapentin 600 MG tablet Commonly known as: NEURONTIN Take 600 mg by mouth every 8 (eight) hours.   latanoprost 0.005 % ophthalmic solution Commonly known as: XALATAN Place 1 drop into both eyes at bedtime.   lisinopril 40 MG tablet Commonly known as: ZESTRIL TAKE 1 TABLET BY MOUTH EVERY DAY   metFORMIN 500 MG 24 hr tablet Commonly known as: GLUCOPHAGE-XR Take 500 mg by mouth 2 (two) times daily as needed (for blood sugars greater than 180).   methocarbamol 750 MG tablet Commonly known as: ROBAXIN Take 750 mg by mouth every 8 (eight) hours. What changed: Another medication with the same name was added. Make sure you understand how and when to take each.   methocarbamol 500 MG tablet Commonly known as: ROBAXIN Take 1 tablet (500 mg total) by mouth every 6 (six) hours as needed for muscle spasms. What changed: You were already taking a medication with the same name, and this prescription was added. Make sure you understand how and when to take each.   metoprolol succinate 50 MG 24 hr tablet Commonly known as: TOPROL-XL Take 1 tablet (50 mg total) by mouth daily.   nitroGLYCERIN 0.4 MG SL tablet Commonly known as: NITROSTAT PLACE 1 TABLET UNDER THE TONGUE EVERY 5MINUTES AS NEEDED FOR CHEST PAIN X3 DOSES What changed: See the new instructions.   ONE TOUCH ULTRA TEST test strip Generic drug: glucose blood USE 1 TIME  DAILY TO CHECK BLOOD SUGAR.   oxyCODONE-acetaminophen 10-325 MG tablet Commonly known as: PERCOCET Take 1 tablet by mouth every 4 (four) hours.   OxyCONTIN 60 MG Tb12 Generic drug: Oxycodone HCl Take 1 tablet by mouth every 8 (eight) hours.   polyethylene glycol 17 g packet Commonly known as: MIRALAX / GLYCOLAX Take 17 g by mouth daily as needed for mild constipation, moderate constipation or severe constipation.   potassium chloride SA 20 MEQ tablet Commonly known as: K-DUR Take 20 mEq by mouth daily.   torsemide 10 MG tablet Commonly known as: DEMADEX Take 5-10 mg by mouth daily.   Ventolin HFA 108 (90 Base) MCG/ACT inhaler Generic drug: albuterol Inhale 2 puffs into the lungs every 4 (four) hours as needed for wheezing or shortness of breath.            Discharge Care Instructions  (From admission, onward)         Start     Ordered   04/05/19 0000  Weight bearing as tolerated     04/05/19 0757   04/05/19 0000  Change dressing    Comments: Change the dressing daily with sterile 4 x 4 inch gauze dressing and paper tape.  You may clean the incision with alcohol prior to redressing   04/05/19 0757   04/04/19 0000  Weight bearing as tolerated     04/04/19 0807  04/04/19 0000  Change dressing    Comments: Change the dressing daily with sterile 4 x 4 inch gauze dressing and paper tape.   04/04/19 0807         Follow-up Information    Gaynelle Arabian, MD. Schedule an appointment as soon as possible for a visit on 04/16/2019.   Specialty: Orthopedic Surgery Contact information: [address] [place] [address] [phone]           Signed: Theresa Duty, PA-C Orthopedic Surgery 04/08/2019, 9:05 AM

## 2019-04-09 ENCOUNTER — Other Ambulatory Visit: Payer: Self-pay | Admitting: Internal Medicine

## 2019-04-09 MED ORDER — NITROGLYCERIN 0.4 MG SL SUBL: SUBLINGUAL_TABLET | SUBLINGUAL | 5 refills | Status: AC

## 2019-04-09 NOTE — Telephone Encounter (Signed)
Pt's medication was sent to pt's pharmacy as requested. Confirmation received.  °

## 2019-08-21 ENCOUNTER — Other Ambulatory Visit: Payer: Self-pay | Admitting: Physician Assistant

## 2019-10-17 ENCOUNTER — Other Ambulatory Visit: Payer: Self-pay | Admitting: Physician Assistant

## 2019-10-18 ENCOUNTER — Other Ambulatory Visit: Payer: Self-pay

## 2019-10-18 ENCOUNTER — Other Ambulatory Visit: Payer: Self-pay | Admitting: Physician Assistant

## 2019-10-18 ENCOUNTER — Telehealth: Payer: Self-pay | Admitting: Internal Medicine

## 2019-10-18 MED ORDER — POTASSIUM CHLORIDE CRYS ER 20 MEQ PO TBCR: 20.0000 meq | EXTENDED_RELEASE_TABLET | Freq: Two times a day (BID) | ORAL | 0 refills | Status: DC

## 2019-10-18 MED ORDER — POTASSIUM CHLORIDE CRYS ER 20 MEQ PO TBCR: 20.0000 meq | EXTENDED_RELEASE_TABLET | Freq: Two times a day (BID) | ORAL | 1 refills | Status: DC

## 2019-10-18 MED ORDER — ATORVASTATIN CALCIUM 40 MG PO TABS: 40.0000 mg | ORAL_TABLET | Freq: Every day | ORAL | 0 refills | Status: DC

## 2019-10-18 MED ORDER — ATORVASTATIN CALCIUM 40 MG PO TABS: 40.0000 mg | ORAL_TABLET | Freq: Every day | ORAL | 1 refills | Status: DC

## 2019-10-18 NOTE — Telephone Encounter (Signed)
New Message  Pt c/o medication issue:  1. Name of Medication: atorvastatin (LIPITOR) 40 MG tablet; potassium chloride SA (KLOR-CON) 20 MEQ tablet  2. How are you currently taking this medication (dosage and times per day)? As written  3. Are you having a reaction (difficulty breathing--STAT)? No  4. What is your medication issue? Pt has no more refills left; needs new prescription for both

## 2019-10-24 ENCOUNTER — Other Ambulatory Visit: Payer: Self-pay | Admitting: Physician Assistant
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
## 2019-10-28 ENCOUNTER — Ambulatory Visit: Payer: Medicare Other | Admitting: Internal Medicine

## 2019-11-01 ENCOUNTER — Ambulatory Visit: Payer: Medicare Other | Admitting: Internal Medicine

## 2019-11-08 ENCOUNTER — Ambulatory Visit: Payer: Medicare Other | Admitting: Internal Medicine

## 2019-11-14 ENCOUNTER — Other Ambulatory Visit: Payer: Self-pay | Admitting: Family Medicine

## 2019-11-14 DIAGNOSIS — M545 Low back pain, unspecified: Secondary | ICD-10-CM

## 2019-11-14 DIAGNOSIS — N029 Recurrent and persistent hematuria with unspecified morphologic changes: Secondary | ICD-10-CM

## 2019-11-21 ENCOUNTER — Telehealth: Payer: Self-pay | Admitting: Physician Assistant

## 2019-11-21 NOTE — Telephone Encounter (Signed)
I was able to move him up by a few days. I have spoken with him.  Are they sending over the records from their office?

## 2019-11-21 NOTE — Telephone Encounter (Signed)
Jessica from [employer] is calling to get this patient in for a sooner appt. After viewing the schedule there is nothing available but she is requesting that we work this patient in for he is in a lot of pain.

## 2019-11-28 ENCOUNTER — Telehealth: Payer: Self-pay | Admitting: Physician Assistant

## 2019-11-29 ENCOUNTER — Inpatient Hospital Stay: Admission: RE | Admit: 2019-11-29 | Payer: Medicare Other | Source: Ambulatory Visit

## 2019-11-29 NOTE — Telephone Encounter (Signed)
Spoke with the patient. No openings before his scheduled appointment. His pain is unchanged from the symptoms he has been feeling. He does not have new symptoms. Patient records have been received. Imaging report is also in Care Everywhere.

## 2019-12-02 ENCOUNTER — Ambulatory Visit: Payer: Medicare Other | Admitting: Gastroenterology

## 2019-12-02 ENCOUNTER — Encounter: Payer: Self-pay | Admitting: Gastroenterology

## 2019-12-02 ENCOUNTER — Ambulatory Visit: Payer: Medicare Other | Admitting: Internal Medicine

## 2019-12-02 VITALS — BP 100/46 | HR 72 | Temp 98.8°F | Ht 64.0 in | Wt 163.1 lb

## 2019-12-02 DIAGNOSIS — R140 Abdominal distension (gaseous): Secondary | ICD-10-CM | POA: Insufficient documentation

## 2019-12-02 DIAGNOSIS — R109 Unspecified abdominal pain: Secondary | ICD-10-CM

## 2019-12-02 DIAGNOSIS — M549 Dorsalgia, unspecified: Secondary | ICD-10-CM | POA: Insufficient documentation

## 2019-12-02 NOTE — Patient Instructions (Signed)
If you are age 72 or older, your body mass index should be between 23-30. Your Body mass index is 28 kg/m. If this is out of the aforementioned range listed, please consider follow up with your Primary Care Provider.  If you are age 74 or younger, your body mass index should be between 19-25. Your Body mass index is 28 kg/m. If this is out of the aformentioned range listed, please consider follow up with your Primary Care Provider.   You have been scheduled for a CT scan of the lumbar and thoracic spine at [facility] ([address]---this is in the same building as [employer]). You are scheduled on Wednesday 12/04/19 at 1:30 pm. You should arrive 15 minutes prior to your appointment time for registration.   You have been scheduled for an endoscopy. Please follow written instructions given to you at your visit today. If you use inhalers (even only as needed), please bring them with you on the day of your procedure.
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
## 2019-12-03 NOTE — Progress Notes (Signed)
Reviewed and agree with management plan.  Daevon Holdren T. Julee Stoll, MD FACG [facility]  

## 2019-12-04 ENCOUNTER — Other Ambulatory Visit: Payer: Self-pay

## 2019-12-04 ENCOUNTER — Ambulatory Visit (INDEPENDENT_AMBULATORY_CARE_PROVIDER_SITE_OTHER)
Admission: RE | Admit: 2019-12-04 | Discharge: 2019-12-04 | Disposition: A | Discharge status: Home / Self Care | Payer: Medicare Other | Source: Ambulatory Visit | Attending: Gastroenterology | Admitting: Gastroenterology

## 2019-12-04 ENCOUNTER — Inpatient Hospital Stay: Admission: RE | Admit: 2019-12-04 | Payer: Medicare Other | Source: Ambulatory Visit

## 2019-12-04 DIAGNOSIS — M549 Dorsalgia, unspecified: Secondary | ICD-10-CM | POA: Diagnosis not present

## 2019-12-06 ENCOUNTER — Ambulatory Visit: Payer: Medicare Other | Admitting: Gastroenterology

## 2019-12-06 ENCOUNTER — Ambulatory Visit: Payer: Medicare Other | Admitting: Nurse Practitioner

## 2019-12-06 ENCOUNTER — Other Ambulatory Visit: Payer: Self-pay

## 2019-12-06 VITALS — BP 121/51 | HR 74 | Temp 97.5°F

## 2019-12-06 NOTE — Progress Notes (Signed)
Patient arrived and asked to Dr. Fuller Plan before being checked in for the procedure. After talking to Dr. Fuller Plan the decision was made to postpone the procedure until another time.

## 2019-12-20 ENCOUNTER — Ambulatory Visit: Payer: Medicare Other | Admitting: Physician Assistant

## 2019-12-20 ENCOUNTER — Encounter: Payer: Self-pay | Admitting: Physician Assistant

## 2019-12-20 ENCOUNTER — Other Ambulatory Visit: Payer: Self-pay

## 2019-12-20 VITALS — BP 152/82 | HR 63 | Ht 65.0 in | Wt 162.6 lb

## 2019-12-20 DIAGNOSIS — I2510 Atherosclerotic heart disease of native coronary artery without angina pectoris: Secondary | ICD-10-CM | POA: Diagnosis not present

## 2019-12-20 DIAGNOSIS — Z01810 Encounter for preprocedural cardiovascular examination: Secondary | ICD-10-CM

## 2019-12-20 DIAGNOSIS — Z720 Tobacco use: Secondary | ICD-10-CM

## 2019-12-20 DIAGNOSIS — I5032 Chronic diastolic (congestive) heart failure: Secondary | ICD-10-CM

## 2019-12-20 DIAGNOSIS — E785 Hyperlipidemia, unspecified: Secondary | ICD-10-CM

## 2019-12-20 DIAGNOSIS — I10 Essential (primary) hypertension: Secondary | ICD-10-CM | POA: Diagnosis not present
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
## 2019-12-24 ENCOUNTER — Ambulatory Visit: Payer: Self-pay | Admitting: Orthopedic Surgery

## 2019-12-25 ENCOUNTER — Telehealth: Payer: Self-pay

## 2019-12-25 NOTE — Telephone Encounter (Signed)
   Primary Cardiologist: Dorris Carnes, MD  Chart reviewed as part of pre-operative protocol coverage. Patient was contacted 12/25/2019 in reference to pre-operative risk assessment for pending surgery as outlined below.  Jaymes Graff Boule was last seen on 12/20/19 by Robbie Lis. Pre op clearance was addressed and he was cleared to undergo planned procedure without further cardiovascular testing.    Also instructed to hold aspirin 7 days prior and resume per the direction of surgeon postoperatively.  I will route this recommendation/office note to the requesting party via Epic fax function and remove from pre-op pool.  Please call with questions.  Reino Bellis, NP 12/25/2019, 4:42 PM

## 2019-12-25 NOTE — Telephone Encounter (Signed)
   [facility] Pre-operative Risk Assessment    Request for surgical clearance:  1. What type of surgery is being performed? T11-T12 KYPHOPLASTY   2. When is this surgery scheduled? 01/16/20   3. What type of clearance is required (medical clearance vs. Pharmacy clearance to hold med vs. Both)? BOTH  4. Are there any medications that need to be held prior to surgery and how long? ASA    5. Practice name and name of physician performing surgery? [facility]; DR. Cook   6. What is your office phone number [phone]    7.   What is your office fax number [phone]  8.   Anesthesia type (None, local, MAC, general) ? NONE LISTED   Jacinta Shoe 12/25/2019, 2:47 PM  _________________________________________________________________   (provider comments below)

## 2019-12-30 ENCOUNTER — Ambulatory Visit: Payer: Medicare Other | Admitting: Physician Assistant

## 2020-01-07 ENCOUNTER — Ambulatory Visit: Payer: Self-pay | Admitting: Orthopedic Surgery

## 2020-01-07 ENCOUNTER — Other Ambulatory Visit: Payer: Self-pay | Admitting: Orthopedic Surgery
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
## 2020-01-08 ENCOUNTER — Other Ambulatory Visit: Payer: Self-pay | Admitting: Orthopedic Surgery

## 2020-01-08 ENCOUNTER — Ambulatory Visit
Admission: RE | Admit: 2020-01-08 | Discharge: 2020-01-08 | Disposition: A | Discharge status: Home / Self Care | Payer: Medicare Other | Source: Ambulatory Visit | Attending: Orthopedic Surgery | Admitting: Orthopedic Surgery

## 2020-01-08 DIAGNOSIS — R52 Pain, unspecified: Secondary | ICD-10-CM

## 2020-01-09 ENCOUNTER — Ambulatory Visit
Admission: RE | Admit: 2020-01-09 | Discharge: 2020-01-09 | Disposition: A | Discharge status: Home / Self Care | Payer: Medicare Other | Source: Ambulatory Visit | Attending: Orthopedic Surgery | Admitting: Orthopedic Surgery

## 2020-01-09 ENCOUNTER — Other Ambulatory Visit: Payer: Self-pay

## 2020-01-09 DIAGNOSIS — R52 Pain, unspecified: Secondary | ICD-10-CM

## 2020-01-13 ENCOUNTER — Other Ambulatory Visit (HOSPITAL_COMMUNITY)
Admission: RE | Admit: 2020-01-13 | Discharge: 2020-01-13 | Disposition: A | Discharge status: Home / Self Care | Payer: Medicare Other | Source: Ambulatory Visit | Attending: Orthopedic Surgery | Admitting: Orthopedic Surgery

## 2020-01-13 ENCOUNTER — Encounter (HOSPITAL_COMMUNITY)
Admission: RE | Admit: 2020-01-13 | Discharge: 2020-01-13 | Disposition: A | Discharge status: Home / Self Care | Payer: Medicare Other | Source: Ambulatory Visit | Attending: Orthopedic Surgery | Admitting: Orthopedic Surgery

## 2020-01-13 ENCOUNTER — Other Ambulatory Visit: Payer: Self-pay

## 2020-01-13 ENCOUNTER — Encounter (HOSPITAL_COMMUNITY): Payer: Self-pay

## 2020-01-13 ENCOUNTER — Ambulatory Visit (HOSPITAL_COMMUNITY)
Admission: RE | Admit: 2020-01-13 | Discharge: 2020-01-13 | Disposition: A | Discharge status: Home / Self Care | Payer: Medicare Other | Source: Ambulatory Visit | Attending: Orthopedic Surgery | Admitting: Orthopedic Surgery

## 2020-01-13 DIAGNOSIS — M4856XA Collapsed vertebra, not elsewhere classified, lumbar region, initial encounter for fracture: Secondary | ICD-10-CM | POA: Diagnosis not present

## 2020-01-13 DIAGNOSIS — Z7901 Long term (current) use of anticoagulants: Secondary | ICD-10-CM | POA: Diagnosis not present

## 2020-01-13 DIAGNOSIS — Z01812 Encounter for preprocedural laboratory examination: Secondary | ICD-10-CM | POA: Insufficient documentation

## 2020-01-13 DIAGNOSIS — Z20822 Contact with and (suspected) exposure to covid-19: Secondary | ICD-10-CM | POA: Insufficient documentation

## 2020-01-13 DIAGNOSIS — Z79899 Other long term (current) drug therapy: Secondary | ICD-10-CM | POA: Insufficient documentation

## 2020-01-13 DIAGNOSIS — Z7984 Long term (current) use of oral hypoglycemic drugs: Secondary | ICD-10-CM | POA: Insufficient documentation

## 2020-01-13 DIAGNOSIS — I480 Paroxysmal atrial fibrillation: Secondary | ICD-10-CM | POA: Diagnosis not present

## 2020-01-13 DIAGNOSIS — R011 Cardiac murmur, unspecified: Secondary | ICD-10-CM | POA: Insufficient documentation

## 2020-01-13 DIAGNOSIS — F17210 Nicotine dependence, cigarettes, uncomplicated: Secondary | ICD-10-CM | POA: Insufficient documentation

## 2020-01-13 DIAGNOSIS — I110 Hypertensive heart disease with heart failure: Secondary | ICD-10-CM | POA: Insufficient documentation

## 2020-01-13 DIAGNOSIS — I252 Old myocardial infarction: Secondary | ICD-10-CM | POA: Insufficient documentation

## 2020-01-13 DIAGNOSIS — I2510 Atherosclerotic heart disease of native coronary artery without angina pectoris: Secondary | ICD-10-CM | POA: Diagnosis not present

## 2020-01-13 DIAGNOSIS — I5032 Chronic diastolic (congestive) heart failure: Secondary | ICD-10-CM | POA: Insufficient documentation

## 2020-01-13 DIAGNOSIS — Z7982 Long term (current) use of aspirin: Secondary | ICD-10-CM | POA: Diagnosis not present

## 2020-01-13 DIAGNOSIS — E118 Type 2 diabetes mellitus with unspecified complications: Secondary | ICD-10-CM | POA: Insufficient documentation

## 2020-01-13 DIAGNOSIS — M4854XA Collapsed vertebra, not elsewhere classified, thoracic region, initial encounter for fracture: Secondary | ICD-10-CM | POA: Diagnosis not present

## 2020-01-13 DIAGNOSIS — Z01818 Encounter for other preprocedural examination: Secondary | ICD-10-CM

## 2020-01-13 LAB — CBC
HCT: 38.1 % — ABNORMAL LOW (ref 39.0–52.0)
Hemoglobin: 12.8 g/dL — ABNORMAL LOW (ref 13.0–17.0)
MCH: 34.3 pg — ABNORMAL HIGH (ref 26.0–34.0)
MCHC: 33.6 g/dL (ref 30.0–36.0)
MCV: 102.1 fL — ABNORMAL HIGH (ref 80.0–100.0)
Platelets: 171 10*3/uL (ref 150–400)
RBC: 3.73 MIL/uL — ABNORMAL LOW (ref 4.22–5.81)
RDW: 13.8 % (ref 11.5–15.5)
WBC: 6.1 10*3/uL (ref 4.0–10.5)
nRBC: 0 % (ref 0.0–0.2)

## 2020-01-13 LAB — SURGICAL PCR SCREEN
MRSA, PCR: NEGATIVE
Staphylococcus aureus: NEGATIVE

## 2020-01-13 LAB — HEMOGLOBIN A1C
Hgb A1c MFr Bld: 5.8 % — ABNORMAL HIGH (ref 4.8–5.6)
Mean Plasma Glucose: 119.76 mg/dL

## 2020-01-13 LAB — BASIC METABOLIC PANEL
Anion gap: 7 (ref 5–15)
BUN: 6 mg/dL — ABNORMAL LOW (ref 8–23)
CO2: 26 mmol/L (ref 22–32)
Calcium: 8.7 mg/dL — ABNORMAL LOW (ref 8.9–10.3)
Chloride: 103 mmol/L (ref 98–111)
Creatinine, Ser: 0.65 mg/dL (ref 0.61–1.24)
GFR calc Af Amer: >60 mL/min (ref >60)
GFR calc non Af Amer: >60 mL/min (ref >60)
Glucose, Bld: 126 mg/dL — ABNORMAL HIGH (ref 70–99)
Potassium: 4 mmol/L (ref 3.5–5.1)
Sodium: 136 mmol/L (ref 135–145)

## 2020-01-13 LAB — GLUCOSE, CAPILLARY: Glucose-Capillary: 123 mg/dL — ABNORMAL HIGH (ref 70–99)

## 2020-01-13 LAB — PROTIME-INR
INR: 1.1 (ref 0.8–1.2)
Prothrombin Time: 13.7 seconds (ref 11.4–15.2)

## 2020-01-13 LAB — SARS CORONAVIRUS 2 (TAT 6-24 HRS): SARS Coronavirus 2: NEGATIVE

## 2020-01-13 LAB — APTT: aPTT: 30 seconds (ref 24–36)

## 2020-01-13 NOTE — Pre-Procedure Instructions (Signed)
[facility] - [place] - [address] [address] [place] [address] Phone: [phone] Fax: [phone]      Your procedure is scheduled on Thursday May 27th.  Report to [facility] Main Entrance "A" at 11:30 A.M., and check in at the Admitting office.  Call this number if you have problems the morning of surgery:  [phone]  Call [phone] if you have any questions prior to your surgery date Monday-Friday 8am-4pm    Remember:  Do not eat or drink after midnight the night before your surgery     Take these medicines the morning of surgery with A SIP OF WATER  atorvastatin (LIPITOR)  busPIRone (BUSPAR) dorzolamidel-timolol (COSOPT) fluticasone (FLONASE) gabapentin (NEURONTIN) metoprolol succinate (TOPROL-XL) If Needed: methocarbamol (ROBAXIN) nitroGLYCERIN (NITROSTAT) Oxycodone HCl (OXYCONTIN) or oxyCODONE-acetaminophen (PERCOCET) VENTOLIN HFA 108 (90 BASE)   HOW TO MANAGE YOUR DIABETES BEFORE AND AFTER SURGERY  Why is it important to control my blood sugar before and after surgery? . Improving blood sugar levels before and after surgery helps healing and can limit problems. . A way of improving blood sugar control is eating a healthy diet by: o  Eating less sugar and carbohydrates o  Increasing activity/exercise o  Talking with your doctor about reaching your blood sugar goals . High blood sugars (greater than 180 mg/dL) can raise your risk of infections and slow your recovery, so you will need to focus on controlling your diabetes during the weeks before surgery. . Make sure that the doctor who takes care of your diabetes knows about your planned surgery including the date and location.  How do I manage my blood sugar before surgery? . Check your blood sugar at least 4 times a day, starting 2 days before surgery, to make sure that the level is not too high or low. o Check your blood sugar the morning of  your surgery when you wake up and every 2 hours until you get to the Short Stay unit. . If your blood sugar is less than 70 mg/dL, you will need to treat for low blood sugar: o Do not take insulin. o Treat a low blood sugar (less than 70 mg/dL) with  cup of clear juice (cranberry or apple), 4 glucose tablets, OR glucose gel. Recheck blood sugar in 15 minutes after treatment (to make sure it is greater than 70 mg/dL). If your blood sugar is not greater than 70 mg/dL on recheck, call [phone] o  for further instructions. . Report your blood sugar to the short stay nurse when you get to Short Stay.  . If you are admitted to the hospital after surgery: o Your blood sugar will be checked by the staff and you will probably be given insulin after surgery (instead of oral diabetes medicines) to make sure you have good blood sugar levels. o The goal for blood sugar control after surgery is 80-180 mg/dL.     WHAT DO I DO ABOUT MY DIABETES MEDICATION?   Marland Kitchen Do not take oral diabetes medicines (pills): metFORMIN (GLUCOPHAGE) the morning of surgery.  As of today, STOP taking any Aspirin (unless otherwise instructed by your surgeon) and Aspirin containing products, Aleve, Naproxen, Ibuprofen, Motrin, Advil, Goody's, BC's, all herbal medications, fish oil, and all vitamins.                      Do not wear jewelry, make up, or nail polish  Do not wear lotions, powders, perfumes/colognes, or deodorant.            Do not shave 48 hours prior to surgery.  Men may shave face and neck.            Do not bring valuables to the hospital.            [facility] is not responsible for any belongings or valuables.  Do NOT Smoke (Tobacco/Vapping) or drink Alcohol 24 hours prior to your procedure If you use a CPAP at night, you may bring all equipment for your overnight stay.   Contacts, glasses, dentures or bridgework may not be worn into surgery.      For patients admitted to the hospital,  discharge time will be determined by your treatment team.   Patients discharged the day of surgery will not be allowed to drive home, and someone needs to stay with them for 24 hours.    Special instructions:   [facility]- Preparing For Surgery  Before surgery, you can play an important role. Because skin is not sterile, your skin needs to be as free of germs as possible. You can reduce the number of germs on your skin by washing with CHG (chlorahexidine gluconate) Soap before surgery.  CHG is an antiseptic cleaner which kills germs and bonds with the skin to continue killing germs even after washing.    Oral Hygiene is also important to reduce your risk of infection.  Remember - BRUSH YOUR TEETH THE MORNING OF SURGERY WITH YOUR REGULAR TOOTHPASTE  Please do not use if you have an allergy to CHG or antibacterial soaps. If your skin becomes reddened/irritated stop using the CHG.  Do not shave (including legs and underarms) for at least 48 hours prior to first CHG shower. It is OK to shave your face.  Please follow these instructions carefully.   1. Shower the NIGHT BEFORE SURGERY and the MORNING OF SURGERY with CHG Soap.   2. If you chose to wash your hair, wash your hair first as usual with your normal shampoo.  3. After you shampoo, rinse your hair and body thoroughly to remove the shampoo.  4. Use CHG as you would any other liquid soap. You can apply CHG directly to the skin and wash gently with a scrungie or a clean washcloth.   5. Apply the CHG Soap to your body ONLY FROM THE NECK DOWN.  Do not use on open wounds or open sores. Avoid contact with your eyes, ears, mouth and genitals (private parts). Wash Face and genitals (private parts)  with your normal soap.   6. Wash thoroughly, paying special attention to the area where your surgery will be performed.  7. Thoroughly rinse your body with warm water from the neck down.  8. DO NOT shower/wash with your normal soap after using  and rinsing off the CHG Soap.  9. Pat yourself dry with a CLEAN TOWEL.  10. Wear CLEAN PAJAMAS to bed the night before surgery, wear comfortable clothes the morning of surgery  11. Place CLEAN SHEETS on your bed the night of your first shower and DO NOT SLEEP WITH PETS.   Day of Surgery:   Do not apply any deodorants/lotions.  Please wear clean clothes to the hospital/surgery center.   Remember to brush your teeth WITH YOUR REGULAR TOOTHPASTE.   Please read over the following fact sheets that you were given.

## 2020-01-13 NOTE — Progress Notes (Signed)
PCP - Dr. Arelia Sneddon Cardiologist - Dr. Curly Shores   Chest x-ray - 01/13/20 EKG - 12/20/19 Stress Test - 02/27/19 ECHO - 04/27/18 Cardiac Cath -06/05/08   Sleep Study - denies CPAP -   Fasting Blood Sugar - 100-120 Checks Blood Sugar-once daily  Aspirin Instructions: Patient instructed to hold all Aspirin, NSAID's, herbal medications, fish oil and vitamins 7 days prior to surgery.   ERAS Protcol - [name] PRE-SURGERY Ensure or G2- [name]  COVID TEST- [facility]13/20 at [facility]. Pt instructed to remain in their car. Educated on Transport planner until [employer].    Anesthesia review: cardiac history; MD order  Patient denies shortness of breath, fever, cough and chest pain at PAT appointment   All instructions explained to the patient, with a verbal understanding of the material. Patient agrees to go over the instructions while at home for a better understanding. Patient also instructed to self quarantine after being tested for COVID-19. The opportunity to ask questions was provided.
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
## 2020-01-14 NOTE — Anesthesia Preprocedure Evaluation (Addendum)
Anesthesia Evaluation  Patient identified by MRN, date of birth, ID band Patient awake    Reviewed: Allergy & Precautions, NPO status , Patient's Chart, lab work & pertinent test results  Airway Mallampati: I  TM Distance: >3 FB Neck ROM: Full    Dental  (+) Edentulous Upper, Edentulous Lower   Pulmonary Current Smoker,    breath sounds clear to auscultation       Cardiovascular hypertension, Pt. on medications and Pt. on home beta blockers + CAD, + Past MI and +CHF  + Valvular Problems/Murmurs  Rhythm:Regular Rate:Normal     Neuro/Psych  Headaches, PSYCHIATRIC DISORDERS Depression  Neuromuscular disease    GI/Hepatic Neg liver ROS, hiatal hernia, GERD  ,  Endo/Other  diabetes, Type 2, Oral Hypoglycemic Agents  Renal/GU negative Renal ROS     Musculoskeletal  (+) Arthritis ,   Abdominal Normal abdominal exam  (+)   Peds  Hematology   Anesthesia Other Findings   Reproductive/Obstetrics                            Anesthesia Physical Anesthesia Plan  ASA: III  Anesthesia Plan: MAC   Post-op Pain Management:    Induction: Intravenous  PONV Risk Score and Plan: 1 and Propofol infusion and Ondansetron  Airway Management Planned: Natural Airway and Simple Face Mask  Additional Equipment: None  Intra-op Plan:   Post-operative Plan:   Informed Consent: I have reviewed the patients History and Physical, chart, labs and discussed the procedure including the risks, benefits and alternatives for the proposed anesthesia with the patient or authorized representative who has indicated his/her understanding and acceptance.       Plan Discussed with: CRNA  Anesthesia Plan Comments: (See APP note by Durel Salts, FNP  Echo:  - Left ventricle: The cavity size was normal. There was mild focal  basal hypertrophy of the septum. Systolic function was normal.  The estimated ejection fraction  was in the range of 55% to 60%.  Wall motion was normal; there were no regional wall motion  abnormalities. Doppler parameters are consistent with abnormal  left ventricular relaxation (grade 1 diastolic dysfunction).  Doppler parameters are consistent with high ventricular filling  pressure.  - Aortic valve: There was no regurgitation. Peak velocity (S): 217  cm/s. Mean gradient (S): 8 mm Hg.  - Aorta: Ascending aortic diameter: 38 mm (S).  - Ascending aorta: The ascending aorta was mildly dilated.  - Mitral valve: Transvalvular velocity was within the normal range.  There was no evidence for stenosis. There was no regurgitation.  - Right ventricle: The cavity size was normal. Wall thickness was  normal. Systolic function was normal.  - Atrial septum: No defect or patent foramen ovale was identified  by color flow Doppler.  - Tricuspid valve: There was mild regurgitation.  - Pulmonary arteries: Systolic pressure was within the normal  range. PA peak pressure: 20 mm Hg (S).  - Global longitudinal strain -19.9% (normal). )       Anesthesia Quick Evaluation

## 2020-01-16 ENCOUNTER — Encounter (HOSPITAL_COMMUNITY)
Admission: RE | Disposition: A | Discharge status: Home / Self Care | Payer: Self-pay | Source: Home / Self Care | Attending: Orthopedic Surgery

## 2020-01-16 ENCOUNTER — Ambulatory Visit (HOSPITAL_COMMUNITY): Payer: Medicare Other

## 2020-01-16 ENCOUNTER — Other Ambulatory Visit: Payer: Self-pay

## 2020-01-16 ENCOUNTER — Encounter (HOSPITAL_COMMUNITY): Payer: Self-pay | Admitting: Orthopedic Surgery

## 2020-01-16 ENCOUNTER — Ambulatory Visit (HOSPITAL_COMMUNITY)
Admission: RE | Admit: 2020-01-16 | Discharge: 2020-01-16 | Disposition: A | Discharge status: Home / Self Care | Payer: Medicare Other | Attending: Orthopedic Surgery | Admitting: Orthopedic Surgery

## 2020-01-16 ENCOUNTER — Ambulatory Visit (HOSPITAL_COMMUNITY): Payer: Medicare Other | Admitting: Emergency Medicine

## 2020-01-16 ENCOUNTER — Ambulatory Visit (HOSPITAL_COMMUNITY): Payer: Medicare Other | Admitting: Certified Registered Nurse Anesthetist

## 2020-01-16 DIAGNOSIS — F17210 Nicotine dependence, cigarettes, uncomplicated: Secondary | ICD-10-CM | POA: Diagnosis not present

## 2020-01-16 DIAGNOSIS — I252 Old myocardial infarction: Secondary | ICD-10-CM | POA: Diagnosis not present

## 2020-01-16 DIAGNOSIS — Z96641 Presence of right artificial hip joint: Secondary | ICD-10-CM | POA: Diagnosis not present

## 2020-01-16 DIAGNOSIS — Z7984 Long term (current) use of oral hypoglycemic drugs: Secondary | ICD-10-CM | POA: Diagnosis not present

## 2020-01-16 DIAGNOSIS — I5032 Chronic diastolic (congestive) heart failure: Secondary | ICD-10-CM | POA: Diagnosis not present

## 2020-01-16 DIAGNOSIS — E119 Type 2 diabetes mellitus without complications: Secondary | ICD-10-CM | POA: Diagnosis not present

## 2020-01-16 DIAGNOSIS — Z955 Presence of coronary angioplasty implant and graft: Secondary | ICD-10-CM | POA: Insufficient documentation

## 2020-01-16 DIAGNOSIS — I110 Hypertensive heart disease with heart failure: Secondary | ICD-10-CM | POA: Insufficient documentation

## 2020-01-16 DIAGNOSIS — Z981 Arthrodesis status: Secondary | ICD-10-CM | POA: Insufficient documentation

## 2020-01-16 DIAGNOSIS — Z79891 Long term (current) use of opiate analgesic: Secondary | ICD-10-CM | POA: Diagnosis not present

## 2020-01-16 DIAGNOSIS — E7800 Pure hypercholesterolemia, unspecified: Secondary | ICD-10-CM | POA: Insufficient documentation

## 2020-01-16 DIAGNOSIS — F329 Major depressive disorder, single episode, unspecified: Secondary | ICD-10-CM | POA: Diagnosis not present

## 2020-01-16 DIAGNOSIS — Z79899 Other long term (current) drug therapy: Secondary | ICD-10-CM | POA: Diagnosis not present

## 2020-01-16 DIAGNOSIS — I2510 Atherosclerotic heart disease of native coronary artery without angina pectoris: Secondary | ICD-10-CM | POA: Diagnosis not present

## 2020-01-16 DIAGNOSIS — Z7982 Long term (current) use of aspirin: Secondary | ICD-10-CM | POA: Diagnosis not present

## 2020-01-16 DIAGNOSIS — J309 Allergic rhinitis, unspecified: Secondary | ICD-10-CM | POA: Diagnosis not present

## 2020-01-16 DIAGNOSIS — M8008XA Age-related osteoporosis with current pathological fracture, vertebra(e), initial encounter for fracture: Secondary | ICD-10-CM | POA: Insufficient documentation

## 2020-01-16 DIAGNOSIS — H409 Unspecified glaucoma: Secondary | ICD-10-CM | POA: Diagnosis not present

## 2020-01-16 DIAGNOSIS — Z419 Encounter for procedure for purposes other than remedying health state, unspecified: Secondary | ICD-10-CM

## 2020-01-16 HISTORY — PX: KYPHOPLASTY: SHX5884

## 2020-01-16 LAB — URINALYSIS, ROUTINE W REFLEX MICROSCOPIC
Bilirubin Urine: NEGATIVE
Glucose, UA: NEGATIVE mg/dL
Hgb urine dipstick: NEGATIVE
Ketones, ur: NEGATIVE mg/dL
Leukocytes,Ua: NEGATIVE
Nitrite: NEGATIVE
Protein, ur: NEGATIVE mg/dL
Specific Gravity, Urine: 1.011 (ref 1.005–1.030)
pH: 6 (ref 5.0–8.0)

## 2020-01-16 LAB — GLUCOSE, CAPILLARY
Glucose-Capillary: 136 mg/dL — ABNORMAL HIGH (ref 70–99)
Glucose-Capillary: 116 mg/dL — ABNORMAL HIGH (ref 70–99)

## 2020-01-16 SURGERY — KYPHOPLASTY
Anesthesia: Monitor Anesthesia Care | Site: Back

## 2020-01-16 MED ORDER — PHENOL 1.4 % MT LIQD
1.0000 | OROMUCOSAL | Status: DC | PRN
Start: 2020-01-16 — End: 2020-01-16

## 2020-01-16 MED ORDER — EPINEPHRINE PF 1 MG/ML IJ SOLN
INTRAMUSCULAR | Status: DC | PRN
  Administered 2020-01-16: .15 mL

## 2020-01-16 MED ORDER — OXYCODONE HCL 5 MG PO TABS
ORAL_TABLET | ORAL | Status: AC
  Filled 2020-01-16: qty 1

## 2020-01-16 MED ORDER — ONDANSETRON HCL 4 MG PO TABS: 4.0000 mg | ORAL_TABLET | Freq: Four times a day (QID) | ORAL | Status: DC | PRN

## 2020-01-16 MED ORDER — CEFAZOLIN SODIUM-DEXTROSE 2-4 GM/100ML-% IV SOLN
INTRAVENOUS | Status: AC
  Filled 2020-01-16: qty 100

## 2020-01-16 MED ORDER — ACETAMINOPHEN 10 MG/ML IV SOLN
INTRAVENOUS | Status: AC
  Filled 2020-01-16: qty 100

## 2020-01-16 MED ORDER — OXYCODONE HCL 5 MG/5ML PO SOLN: 5.0000 mg | Freq: Once | ORAL | Status: AC | PRN

## 2020-01-16 MED ORDER — SODIUM CHLORIDE 0.9% FLUSH: 3.0000 mL | Freq: Two times a day (BID) | INTRAVENOUS | Status: DC

## 2020-01-16 MED ORDER — OXYCODONE HCL 5 MG PO TABS
5.0000 mg | ORAL_TABLET | Freq: Once | ORAL | Status: AC | PRN
  Administered 2020-01-16: 5 mg via ORAL

## 2020-01-16 MED ORDER — SODIUM CHLORIDE 0.9 % IV SOLN: 250.0000 mL | INTRAVENOUS | Status: DC

## 2020-01-16 MED ORDER — ORAL CARE MOUTH RINSE: 15.0000 mL | Freq: Once | OROMUCOSAL | Status: AC

## 2020-01-16 MED ORDER — SODIUM CHLORIDE 0.9% FLUSH: 3.0000 mL | INTRAVENOUS | Status: DC | PRN

## 2020-01-16 MED ORDER — FENTANYL CITRATE (PF) 100 MCG/2ML IJ SOLN
25.0000 ug | INTRAMUSCULAR | Status: DC | PRN
  Administered 2020-01-16: 25 ug via INTRAVENOUS
  Administered 2020-01-16: 50 ug via INTRAVENOUS
  Administered 2020-01-16: 25 ug via INTRAVENOUS

## 2020-01-16 MED ORDER — ONDANSETRON HCL 4 MG/2ML IJ SOLN: 4.0000 mg | Freq: Once | INTRAMUSCULAR | Status: DC | PRN

## 2020-01-16 MED ORDER — BUPIVACAINE HCL (PF) 0.25 % IJ SOLN
INTRAMUSCULAR | Status: AC
  Filled 2020-01-16: qty 30

## 2020-01-16 MED ORDER — PROPOFOL 10 MG/ML IV BOLUS
INTRAVENOUS | Status: AC
  Filled 2020-01-16: qty 20

## 2020-01-16 MED ORDER — CEFAZOLIN SODIUM-DEXTROSE 2-4 GM/100ML-% IV SOLN: 2.0000 g | INTRAVENOUS | Status: DC

## 2020-01-16 MED ORDER — CHLORHEXIDINE GLUCONATE 0.12 % MT SOLN
OROMUCOSAL | Status: AC
  Administered 2020-01-16: 15 mL via OROMUCOSAL
  Filled 2020-01-16: qty 15

## 2020-01-16 MED ORDER — MENTHOL 3 MG MT LOZG: 1.0000 | LOZENGE | OROMUCOSAL | Status: DC | PRN

## 2020-01-16 MED ORDER — LACTATED RINGERS IV SOLN: INTRAVENOUS | Status: DC

## 2020-01-16 MED ORDER — IOPAMIDOL (ISOVUE-300) INJECTION 61%
INTRAVENOUS | Status: DC | PRN
  Administered 2020-01-16: 100 mL

## 2020-01-16 MED ORDER — PROPOFOL 1000 MG/100ML IV EMUL
INTRAVENOUS | Status: AC
  Filled 2020-01-16: qty 100

## 2020-01-16 MED ORDER — ONDANSETRON HCL 4 MG/2ML IJ SOLN: 4.0000 mg | Freq: Four times a day (QID) | INTRAMUSCULAR | Status: DC | PRN

## 2020-01-16 MED ORDER — BUPIVACAINE LIPOSOME 1.3 % IJ SUSP
20.0000 mL | Freq: Once | INTRAMUSCULAR | Status: DC
  Filled 2020-01-16: qty 20

## 2020-01-16 MED ORDER — ONDANSETRON HCL 4 MG PO TABS: 4.0000 mg | ORAL_TABLET | Freq: Three times a day (TID) | ORAL | 0 refills | Status: AC | PRN

## 2020-01-16 MED ORDER — 0.9 % SODIUM CHLORIDE (POUR BTL) OPTIME
TOPICAL | Status: DC | PRN
  Administered 2020-01-16: 1000 mL

## 2020-01-16 MED ORDER — EPINEPHRINE PF 1 MG/ML IJ SOLN
INTRAMUSCULAR | Status: AC
  Filled 2020-01-16: qty 1

## 2020-01-16 MED ORDER — BUPIVACAINE HCL (PF) 0.25 % IJ SOLN
INTRAMUSCULAR | Status: DC | PRN
  Administered 2020-01-16: 20 mL

## 2020-01-16 MED ORDER — FENTANYL CITRATE (PF) 250 MCG/5ML IJ SOLN
INTRAMUSCULAR | Status: AC
  Filled 2020-01-16: qty 5

## 2020-01-16 MED ORDER — CHLORHEXIDINE GLUCONATE 0.12 % MT SOLN: 15.0000 mL | Freq: Once | OROMUCOSAL | Status: AC

## 2020-01-16 MED ORDER — MIDAZOLAM HCL 2 MG/2ML IJ SOLN
INTRAMUSCULAR | Status: AC
  Filled 2020-01-16: qty 2

## 2020-01-16 MED ORDER — BUPIVACAINE LIPOSOME 1.3 % IJ SUSP
INTRAMUSCULAR | Status: DC | PRN
  Administered 2020-01-16: 20 mL

## 2020-01-16 MED ORDER — FENTANYL CITRATE (PF) 100 MCG/2ML IJ SOLN
INTRAMUSCULAR | Status: AC
  Filled 2020-01-16: qty 2

## 2020-01-16 SURGICAL SUPPLY — 44 items
BLADE SURG 15 STRL LF DISP TIS (BLADE) ×1 IMPLANT
BLADE SURG 15 STRL SS (BLADE) ×3
BNDG ADH 1X3 SHEER STRL LF (GAUZE/BANDAGES/DRESSINGS) ×6 IMPLANT
CEMENT KYPHON CX01A KIT/MIXER (Cement) ×3 IMPLANT
COVER MAYO STAND STRL (DRAPES) ×3 IMPLANT
COVER SURGICAL LIGHT HANDLE (MISCELLANEOUS) ×3 IMPLANT
COVER WAND RF STERILE (DRAPES) IMPLANT
CURETTE EXPRESS SZ2 7MM (INSTRUMENTS) IMPLANT
CURETTE WEDGE 8.5MM KYPHX (MISCELLANEOUS) ×3 IMPLANT
CURRETTE EXPRESS SZ2 7MM (INSTRUMENTS)
DERMABOND ADHESIVE PROPEN (GAUZE/BANDAGES/DRESSINGS) ×2
DERMABOND ADVANCED (GAUZE/BANDAGES/DRESSINGS) ×2
DERMABOND ADVANCED .7 DNX12 (GAUZE/BANDAGES/DRESSINGS) ×1 IMPLANT
DERMABOND ADVANCED .7 DNX6 (GAUZE/BANDAGES/DRESSINGS) ×1 IMPLANT
DRAPE C-ARM 42X72 X-RAY (DRAPES) ×6 IMPLANT
DRAPE INCISE IOBAN 66X45 STRL (DRAPES) ×3 IMPLANT
DRAPE LAPAROTOMY T 102X78X121 (DRAPES) ×3 IMPLANT
DRAPE WARM FLUID 44X44 (DRAPES) ×3 IMPLANT
DURAPREP 26ML APPLICATOR (WOUND CARE) ×3 IMPLANT
GLOVE BIO SURGEON STRL SZ 6.5 (GLOVE) ×2 IMPLANT
GLOVE BIO SURGEONS STRL SZ 6.5 (GLOVE) ×1
GLOVE BIOGEL PI IND STRL 6.5 (GLOVE) ×1 IMPLANT
GLOVE BIOGEL PI IND STRL 8.5 (GLOVE) IMPLANT
GLOVE BIOGEL PI INDICATOR 6.5 (GLOVE) ×2
GLOVE BIOGEL PI INDICATOR 8.5 (GLOVE)
GLOVE SS BIOGEL STRL SZ 8.5 (GLOVE) ×1 IMPLANT
GLOVE SUPERSENSE BIOGEL SZ 8.5 (GLOVE) ×2
GOWN STRL REUS W/ TWL LRG LVL3 (GOWN DISPOSABLE) ×2 IMPLANT
GOWN STRL REUS W/TWL 2XL LVL3 (GOWN DISPOSABLE) ×3 IMPLANT
GOWN STRL REUS W/TWL LRG LVL3 (GOWN DISPOSABLE) ×6
KIT BASIN OR (CUSTOM PROCEDURE TRAY) ×3 IMPLANT
KIT TURNOVER KIT B (KITS) ×3 IMPLANT
NEEDLE HYPO 22GX1.5 SAFETY (NEEDLE) ×3 IMPLANT
NEEDLE SPNL 22GX3.5 QUINCKE BK (NEEDLE) ×3 IMPLANT
NS IRRIG 1000ML POUR BTL (IV SOLUTION) ×3 IMPLANT
PACK SURGICAL SETUP 50X90 (CUSTOM PROCEDURE TRAY) ×3 IMPLANT
PAD ARMBOARD 7.5X6 YLW CONV (MISCELLANEOUS) ×6 IMPLANT
SPONGE LAP 4X18 RFD (DISPOSABLE) ×3 IMPLANT
SUT MNCRL AB 3-0 PS2 18 (SUTURE) ×3 IMPLANT
SYR CONTROL 10ML LL (SYRINGE) ×3 IMPLANT
TOWEL GREEN STERILE (TOWEL DISPOSABLE) ×3 IMPLANT
TRAY KYPHOPAK 15/3 ONESTEP 1ST (MISCELLANEOUS) ×3 IMPLANT
TRAY KYPHOPAK 20/3 ONESTEP 1ST (MISCELLANEOUS) ×3 IMPLANT
WATER STERILE IRR 1000ML POUR (IV SOLUTION) ×3 IMPLANT

## 2020-01-16 NOTE — Op Note (Signed)
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  He was very mobile and somewhat agitated.  I elected only to place a unilateral balloon at each level.  Indications: [name] is a very pleasant 74 year old gentleman with multiple medical issues who presents initially with a thoracic 11 and 12 compression fracture.  Because of severe pain we were electing to move forward with the kyphoplasty.  Due to his multiple medical comorbidities it was not felt as though he was a good candidate for general anesthesia and endotracheal intubation.  Subsequent follow-up demonstrated a vertebral plana of T12 but a new L2 fracture.  Because of his increasing pain we elected to move forward after medical clearance was obtained with the T11 and L2 kyphoplasty.  All appropriate risks benefits and alternatives were discussed with the patient and consent was obtained.  Operative report  Patient was brought the operating room turned prone on the operating room table.  After IV sedation the back was prepped and draped in a standard fashion.  Timeout was taken to confirm patient procedure and all other important data.  I then identified the left lateral aspect of the elbow to pedicle and marked out the incision site.  I infiltrated with quarter percent Marcaine with Exparel and then made a small stab incision and advanced the Jamshidi needle down to the lateral aspect of the pedicle.  Patient was tolerating the procedure well at this point.  I then advanced the Jamshidi needle into the L2 pedicle and I confirmed trajectory and position using biplane fluoroscopy.  Once I was nearing the medial wall of the pedicle on the AP view I confirmed that it was just beyond the posterior wall of the vertebral body  on the lateral view.  Once I was in the vertebral body I then placed the drill and then sounded the canal to ensure it was solid.  I then began to attempt to place the right L2 Jamshidi needle.  Again the area was anesthetized and a small incision was made and as I advanced the Jamshidi needle he became somewhat agitated he was rotating his body moving.  Despite increasing his IV sedation and local anesthesia as well as attempting to calm him down he did not respond well.  As a result I aborted trying to place the right Jamshidi needle.  I anesthetized the left lateral aspect of of the T11 pedicle and I was able to place the Jamshidi needle at this level.  I again used biplane fluoroscopy to guide the Jamshidi needle into the vertebral body.  I then drilled sounded the canal and then placed the inflatable bone tamp.  Once both inflatable bone tamps were placed I began inflating.  Because of his agitation I elected just to use a unilateral cement placement.  The patient was not tolerating the procedure well.  Once I inflated the balloons to the appropriate size I deflated them and then inserted the cement at each level.  Approximately 3 cc were placed at L2 and 2 cc at T11.  The cement was allowed to harden and then I remove the Jamshidi needles.  I closed the wounds with interrupted 3-0 Vicryl suture Dermabond and Band-Aids were placed.  The patient was then transferred to the PACU without incident.  The end of the case all needle sponge  counts were correct.  There were no adverse intraoperative events.

## 2020-01-16 NOTE — Brief Op Note (Signed)
01/16/2020  2:52 PM  PATIENT:  [name]  74 y.o. male  PRE-OPERATIVE DIAGNOSIS:  T11 and  L2 compression fractures  POST-OPERATIVE DIAGNOSIS:  T11 and  L2 compression fractures  PROCEDURE:  Procedure(s) with comments: THORACIC ELEVEN AND LUMBAR TWO  KYPHOPLASTY ([name]) - 120 mins  SURGEON:  Surgeon(s) and Role:    Melina Schools, MD - Primary  PHYSICIAN ASSISTANT:   ASSISTANTS: none   ANESTHESIA:   local and IV sedation  EBL:  none   BLOOD ADMINISTERED:none  DRAINS: none   LOCAL MEDICATIONS USED:  MARCAINE    and OTHER exparel  SPECIMEN:  No Specimen  DISPOSITION OF SPECIMEN:  [name]  COUNTS:  YES  TOURNIQUET:  * No tourniquets in log *  DICTATION: .Dragon Dictation  PLAN OF CARE: Discharge to home after PACU  PATIENT DISPOSITION:  PACU - hemodynamically stable.

## 2020-01-16 NOTE — H&P (Signed)
Addendum H&P  Patient continues to have significant thoracolumbar pain and loss in quality of life.  At the time of his most recent imaging studies he had a vertebral plana of T12 which prohibited [place] from addressing this with the kyphoplasty.  However the T11 and new L2 fracture are still amenable to a kyphoplasty procedure.  I have gone over this with the patient and we have discussed the risks benefits and alternatives to surgery.  All of his questions were encouraged and addressed.  There has been no change in his clinical exam since his last office visit of 01/07/2020

## 2020-01-17 MED FILL — Lidocaine HCl Local Soln Prefilled Syringe 100 MG/5ML (2%): INTRAMUSCULAR | Qty: 5 | Status: AC

## 2020-01-17 MED FILL — Midazolam HCl Inj 2 MG/2ML (Base Equivalent): INTRAMUSCULAR | Qty: 2 | Status: AC

## 2020-01-17 MED FILL — Fentanyl Citrate Preservative Free (PF) Inj 250 MCG/5ML: INTRAMUSCULAR | Qty: 5 | Status: AC

## 2020-01-17 MED FILL — Propofol IV Emul 200 MG/20ML (10 MG/ML): INTRAVENOUS | Qty: 20 | Status: AC

## 2020-01-17 MED FILL — Ondansetron HCl Inj 4 MG/2ML (2 MG/ML): INTRAMUSCULAR | Qty: 2 | Status: AC

## 2020-01-17 MED FILL — Glycopyrrolate Inj 0.2 MG/ML: INTRAMUSCULAR | Qty: 1 | Status: AC

## 2020-01-17 NOTE — Anesthesia Postprocedure Evaluation (Signed)
Anesthesia Post Note  Patient: Delsin Copen Kagawa  Procedure(s) Performed: THORACIC ELEVEN AND LUMBAR TWO  KYPHOPLASTY ([name] Back)     Patient location during evaluation: PACU Anesthesia Type: MAC Level of consciousness: awake and alert Pain management: pain level controlled Vital Signs Assessment: post-procedure vital signs reviewed and stable Respiratory status: spontaneous breathing, nonlabored ventilation, respiratory function stable and patient connected to nasal cannula oxygen Cardiovascular status: stable and blood pressure returned to baseline Postop Assessment: no apparent nausea or vomiting Anesthetic complications: no    Last Vitals:  Vitals:   01/16/20 1515 01/16/20 1530  BP: (!) 165/85 (!) 156/89  Pulse: 78 79  Resp: 19 16  Temp:  36.5 C  SpO2: 95% 96%    Last Pain:  Vitals:   01/16/20 1530  TempSrc:   PainSc: Asleep                 Effie Berkshire

## 2020-01-17 NOTE — Addendum Note (Signed)
Addendum  created 01/17/20 0906 by Effie Berkshire, MD   Attestation recorded in [place], Clinical Note Signed, [place] filed

## 2020-01-23 ENCOUNTER — Other Ambulatory Visit: Payer: Self-pay | Admitting: Physician Assistant

## 2020-03-17 ENCOUNTER — Other Ambulatory Visit: Payer: Self-pay | Admitting: Physician Assistant

## 2020-04-18 ENCOUNTER — Encounter (HOSPITAL_COMMUNITY): Payer: Self-pay | Admitting: Emergency Medicine

## 2020-04-18 ENCOUNTER — Emergency Department (HOSPITAL_COMMUNITY)
Admission: EM | Admit: 2020-04-18 | Discharge: 2020-04-18 | Disposition: A | Discharge status: Home / Self Care | Payer: Medicare Other | Attending: Emergency Medicine | Admitting: Emergency Medicine

## 2020-04-18 ENCOUNTER — Other Ambulatory Visit: Payer: Self-pay

## 2020-04-18 DIAGNOSIS — Z5321 Procedure and treatment not carried out due to patient leaving prior to being seen by health care provider: Secondary | ICD-10-CM | POA: Insufficient documentation

## 2020-04-18 DIAGNOSIS — R112 Nausea with vomiting, unspecified: Secondary | ICD-10-CM | POA: Insufficient documentation

## 2020-04-18 LAB — CBC
HCT: 42.8 % (ref 39.0–52.0)
Hemoglobin: 14.7 g/dL (ref 13.0–17.0)
MCH: 33.7 pg (ref 26.0–34.0)
MCHC: 34.3 g/dL (ref 30.0–36.0)
MCV: 98.2 fL (ref 80.0–100.0)
Platelets: 188 10*3/uL (ref 150–400)
RBC: 4.36 MIL/uL (ref 4.22–5.81)
RDW: 13.2 % (ref 11.5–15.5)
WBC: 7.8 10*3/uL (ref 4.0–10.5)
nRBC: 0 % (ref 0.0–0.2)

## 2020-04-18 LAB — COMPREHENSIVE METABOLIC PANEL
ALT: 15 U/L (ref 0–44)
AST: 19 U/L (ref 15–41)
Albumin: 4.1 g/dL (ref 3.5–5.0)
Alkaline Phosphatase: 89 U/L (ref 38–126)
Anion gap: 12 (ref 5–15)
BUN: 11 mg/dL (ref 8–23)
CO2: 24 mmol/L (ref 22–32)
Calcium: 9.2 mg/dL (ref 8.9–10.3)
Chloride: 100 mmol/L (ref 98–111)
Creatinine, Ser: 0.68 mg/dL (ref 0.61–1.24)
GFR calc Af Amer: >60 mL/min (ref >60)
GFR calc non Af Amer: >60 mL/min (ref >60)
Glucose, Bld: 135 mg/dL — ABNORMAL HIGH (ref 70–99)
Potassium: 3.4 mmol/L — ABNORMAL LOW (ref 3.5–5.1)
Sodium: 136 mmol/L (ref 135–145)
Total Bilirubin: 1.4 mg/dL — ABNORMAL HIGH (ref 0.3–1.2)
Total Protein: 6.9 g/dL (ref 6.5–8.1)

## 2020-04-18 LAB — LIPASE, BLOOD: Lipase: 17 U/L (ref 11–51)

## 2020-04-18 MED ORDER — ONDANSETRON 4 MG PO TBDP
4.0000 mg | ORAL_TABLET | Freq: Once | ORAL | Status: AC | PRN
  Administered 2020-04-18: 4 mg via ORAL
  Filled 2020-04-18: qty 1

## 2020-04-18 NOTE — ED Notes (Signed)
No response for vitals x3

## 2020-04-18 NOTE — ED Triage Notes (Signed)
Per EMS, pt from home, c/o nausea and vomiting X2 days ever since he [facility].  No other issues

## 2020-04-19 ENCOUNTER — Emergency Department (HOSPITAL_COMMUNITY): Payer: Medicare Other

## 2020-04-19 ENCOUNTER — Emergency Department (HOSPITAL_COMMUNITY)
Admission: EM | Admit: 2020-04-19 | Discharge: 2020-04-19 | Disposition: A | Discharge status: Home / Self Care | Payer: Medicare Other | Attending: Emergency Medicine | Admitting: Emergency Medicine

## 2020-04-19 ENCOUNTER — Encounter (HOSPITAL_COMMUNITY): Payer: Self-pay

## 2020-04-19 DIAGNOSIS — R079 Chest pain, unspecified: Secondary | ICD-10-CM | POA: Diagnosis present

## 2020-04-19 DIAGNOSIS — R6884 Jaw pain: Secondary | ICD-10-CM | POA: Diagnosis not present

## 2020-04-19 DIAGNOSIS — Z5321 Procedure and treatment not carried out due to patient leaving prior to being seen by health care provider: Secondary | ICD-10-CM | POA: Diagnosis not present

## 2020-04-19 DIAGNOSIS — R110 Nausea: Secondary | ICD-10-CM | POA: Diagnosis not present

## 2020-04-19 DIAGNOSIS — M79602 Pain in left arm: Secondary | ICD-10-CM | POA: Insufficient documentation

## 2020-04-19 LAB — CBC
HCT: 40.9 % (ref 39.0–52.0)
Hemoglobin: 13.9 g/dL (ref 13.0–17.0)
MCH: 33.9 pg (ref 26.0–34.0)
MCHC: 34 g/dL (ref 30.0–36.0)
MCV: 99.8 fL (ref 80.0–100.0)
Platelets: 176 10*3/uL (ref 150–400)
RBC: 4.1 MIL/uL — ABNORMAL LOW (ref 4.22–5.81)
RDW: 13.2 % (ref 11.5–15.5)
WBC: 10.3 10*3/uL (ref 4.0–10.5)
nRBC: 0 % (ref 0.0–0.2)

## 2020-04-19 LAB — BASIC METABOLIC PANEL
Anion gap: 13 (ref 5–15)
BUN: 13 mg/dL (ref 8–23)
CO2: 23 mmol/L (ref 22–32)
Calcium: 8.9 mg/dL (ref 8.9–10.3)
Chloride: 99 mmol/L (ref 98–111)
Creatinine, Ser: 0.71 mg/dL (ref 0.61–1.24)
GFR calc Af Amer: >60 mL/min (ref >60)
GFR calc non Af Amer: >60 mL/min (ref >60)
Glucose, Bld: 132 mg/dL — ABNORMAL HIGH (ref 70–99)
Potassium: 3.3 mmol/L — ABNORMAL LOW (ref 3.5–5.1)
Sodium: 135 mmol/L (ref 135–145)

## 2020-04-19 LAB — TROPONIN I (HIGH SENSITIVITY): Troponin I (High Sensitivity): 17 ng/L (ref <18)
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
## 2020-04-19 NOTE — ED Notes (Signed)
Pt family here to pick him up, IV removed, discussed can return if they want to at any time

## 2020-04-28 ENCOUNTER — Other Ambulatory Visit: Payer: Self-pay | Admitting: Internal Medicine

## 2020-05-26 ENCOUNTER — Telehealth: Payer: Self-pay | Admitting: Gastroenterology

## 2020-05-26 DIAGNOSIS — R140 Abdominal distension (gaseous): Secondary | ICD-10-CM

## 2020-05-26 DIAGNOSIS — R1084 Generalized abdominal pain: Secondary | ICD-10-CM

## 2020-05-26 DIAGNOSIS — A09 Infectious gastroenteritis and colitis, unspecified: Secondary | ICD-10-CM

## 2020-05-26 NOTE — Telephone Encounter (Signed)
Patient's daughter notified.

## 2020-05-26 NOTE — Telephone Encounter (Signed)
Patients daughter called states the patient is suffering from diarrhea and has lost his appetite

## 2020-05-26 NOTE — Telephone Encounter (Signed)
Patient with diarrhea, nausea, and vomiting, bloating.  Symptoms present for about 5 days.  Patient with a hx of C-diff in 2018.  His stools are liquid to semi-solid 4-5 times a day.  No sick contacts including exposure to COVID.  No recent antibiotics.  His daughter is asked to keep him on a liquid diet while he is having the vomiting.  I put him on to see Dr. Carlean Purl Friday.  Dr. Fuller Plan would you like any labs/ stool studies prior to the appt?

## 2020-05-26 NOTE — Telephone Encounter (Signed)
CBC, CMP, lipase, GI stool profile

## 2020-05-27 ENCOUNTER — Other Ambulatory Visit: Payer: Self-pay

## 2020-05-27 ENCOUNTER — Other Ambulatory Visit (INDEPENDENT_AMBULATORY_CARE_PROVIDER_SITE_OTHER): Payer: Medicare Other

## 2020-05-27 DIAGNOSIS — R140 Abdominal distension (gaseous): Secondary | ICD-10-CM

## 2020-05-27 DIAGNOSIS — R1084 Generalized abdominal pain: Secondary | ICD-10-CM

## 2020-05-27 DIAGNOSIS — E871 Hypo-osmolality and hyponatremia: Secondary | ICD-10-CM

## 2020-05-27 DIAGNOSIS — R7309 Other abnormal glucose: Secondary | ICD-10-CM

## 2020-05-27 DIAGNOSIS — A09 Infectious gastroenteritis and colitis, unspecified: Secondary | ICD-10-CM | POA: Diagnosis not present

## 2020-05-27 LAB — CBC
HCT: 40.4 % (ref 39.0–52.0)
Hemoglobin: 14.2 g/dL (ref 13.0–17.0)
MCHC: 35.1 g/dL (ref 30.0–36.0)
MCV: 98.2 fl (ref 78.0–100.0)
Platelets: 215 10*3/uL (ref 150.0–400.0)
RBC: 4.12 Mil/uL — ABNORMAL LOW (ref 4.22–5.81)
RDW: 13.7 % (ref 11.5–15.5)
WBC: 8.3 10*3/uL (ref 4.0–10.5)

## 2020-05-27 LAB — COMPREHENSIVE METABOLIC PANEL
ALT: 13 U/L (ref 0–53)
AST: 14 U/L (ref 0–37)
Albumin: 4 g/dL (ref 3.5–5.2)
Alkaline Phosphatase: 85 U/L (ref 39–117)
BUN: 11 mg/dL (ref 6–23)
CO2: 30 mEq/L (ref 19–32)
Calcium: 8.9 mg/dL (ref 8.4–10.5)
Chloride: 96 mEq/L (ref 96–112)
Creatinine, Ser: 0.8 mg/dL (ref 0.40–1.50)
GFR: 89.13 mL/min (ref >60.00)
Glucose, Bld: 148 mg/dL — ABNORMAL HIGH (ref 70–99)
Potassium: 3.6 mEq/L (ref 3.5–5.1)
Sodium: 132 mEq/L — ABNORMAL LOW (ref 135–145)
Total Bilirubin: 0.9 mg/dL (ref 0.2–1.2)
Total Protein: 6.7 g/dL (ref 6.0–8.3)

## 2020-05-27 LAB — LIPASE: Lipase: <3 U/L — ABNORMAL LOW (ref 11.0–59.0)

## 2020-05-28 ENCOUNTER — Other Ambulatory Visit: Payer: Medicare Other

## 2020-05-28 DIAGNOSIS — A09 Infectious gastroenteritis and colitis, unspecified: Secondary | ICD-10-CM

## 2020-05-28 DIAGNOSIS — R1084 Generalized abdominal pain: Secondary | ICD-10-CM

## 2020-05-28 DIAGNOSIS — R140 Abdominal distension (gaseous): Secondary | ICD-10-CM

## 2020-05-29 ENCOUNTER — Ambulatory Visit (INDEPENDENT_AMBULATORY_CARE_PROVIDER_SITE_OTHER): Payer: Medicare Other | Admitting: Internal Medicine

## 2020-05-29 ENCOUNTER — Other Ambulatory Visit (INDEPENDENT_AMBULATORY_CARE_PROVIDER_SITE_OTHER): Payer: Medicare Other

## 2020-05-29 ENCOUNTER — Encounter: Payer: Self-pay | Admitting: Internal Medicine

## 2020-05-29 VITALS — BP 134/70 | HR 57 | Ht 66.0 in | Wt 144.0 lb

## 2020-05-29 DIAGNOSIS — R7309 Other abnormal glucose: Secondary | ICD-10-CM

## 2020-05-29 DIAGNOSIS — E871 Hypo-osmolality and hyponatremia: Secondary | ICD-10-CM

## 2020-05-29 DIAGNOSIS — R197 Diarrhea, unspecified: Secondary | ICD-10-CM | POA: Diagnosis not present

## 2020-05-29 DIAGNOSIS — R112 Nausea with vomiting, unspecified: Secondary | ICD-10-CM

## 2020-05-29 LAB — BASIC METABOLIC PANEL
BUN: 11 mg/dL (ref 6–23)
CO2: 31 mEq/L (ref 19–32)
Calcium: 8.7 mg/dL (ref 8.4–10.5)
Chloride: 99 mEq/L (ref 96–112)
Creatinine, Ser: 0.78 mg/dL (ref 0.40–1.50)
GFR: 90.06 mL/min (ref >60.00)
Glucose, Bld: 121 mg/dL — ABNORMAL HIGH (ref 70–99)
Potassium: 4 mEq/L (ref 3.5–5.1)
Sodium: 135 mEq/L (ref 135–145)

## 2020-05-29 MED ORDER — PROMETHAZINE HCL 25 MG PO TABS: 25.0000 mg | ORAL_TABLET | Freq: Four times a day (QID) | ORAL | 0 refills | Status: AC | PRN

## 2020-05-29 MED ORDER — VANCOMYCIN HCL 125 MG PO CAPS: 125.0000 mg | ORAL_CAPSULE | Freq: Four times a day (QID) | ORAL | 0 refills | Status: AC
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
## 2020-05-29 NOTE — Patient Instructions (Signed)
  Your provider has requested that you go to the basement level for lab work before leaving today. Press "B" on the elevator. The lab is located at the first door on the left as you exit the elevator.  Force liquids and stay on a liquid diet.  We have sent the following medications to your pharmacy for you to pick up at your convenience: Promethazine , vancomycin   I appreciate the opportunity to care for you. Silvano Rusk, MD, [facility]

## 2020-06-01 ENCOUNTER — Telehealth: Payer: Self-pay | Admitting: Internal Medicine

## 2020-06-01 NOTE — Telephone Encounter (Signed)
Pt states he will keep the appt scheduled for tomorrow/

## 2020-06-01 NOTE — Telephone Encounter (Signed)
Pt is requesting a call back from a nurse to discuss the antibiotic vancomycin that was prescribed to him. Pt states the medication has worsened his diarrhea.

## 2020-06-01 NOTE — Telephone Encounter (Signed)
[name] informed and will check with his daughter about coming tomorrow.

## 2020-06-01 NOTE — Telephone Encounter (Signed)
He said he has trouble with antibiotics , the vancomycin has caused terrible diarrhea , no fever. He quit counting the number of times he was going.

## 2020-06-01 NOTE — Telephone Encounter (Signed)
I would say it is unusual for vancomycin to do that but it is reasonable to hold it right now.  We are still waiting on the stool studies.  It is possible he has a fecal impaction, we did not think that was likely when speaking to him the other day.  If he can come back in tomorrow for a visit I can check for that.

## 2020-06-02 ENCOUNTER — Ambulatory Visit: Payer: Medicare Other | Admitting: Internal Medicine

## 2020-06-02 ENCOUNTER — Telehealth: Payer: Self-pay | Admitting: Gastroenterology

## 2020-06-02 LAB — GI PROFILE, STOOL, PCR

## 2020-06-02 MED ORDER — DIFICID 200 MG PO TABS: 200.0000 mg | ORAL_TABLET | Freq: Two times a day (BID) | ORAL | 0 refills | Status: AC

## 2020-06-02 NOTE — Telephone Encounter (Signed)
Spoke to patient Get back on vancomycin Try 2 loperamide at a time Force fluids Will try to get dificid for him - he recalls taking that in 2018

## 2020-06-02 NOTE — Telephone Encounter (Signed)
[facility] called to inform that results for c-diff were positive.

## 2020-06-02 NOTE — Telephone Encounter (Signed)
I sent in a Dificid prescription.  It looks like it is covered.  Explained that to the patient if it gets kicked back we will figure out what we need to do to try to get it authorized.  When he gets that he can stop the vancomycin

## 2020-06-03 NOTE — Telephone Encounter (Signed)
Take Dificid (did he get it?)  Can schedule f/u Stark or an APP for about 2 weeks

## 2020-06-03 NOTE — Telephone Encounter (Signed)
The drugstore called and said his medicine is ready. He verbalized understanding to get the Dificid and to stop the vancomycin. A f/u appointment has been made with Dr Fuller Plan.

## 2020-06-03 NOTE — Telephone Encounter (Signed)
Please advise Sir, thank you. 

## 2020-06-03 NOTE — Telephone Encounter (Signed)
Patient called states he forgot what  Dr. Carlean Purl had recommended for him to do and when to come back

## 2020-06-25 ENCOUNTER — Encounter: Payer: Self-pay | Admitting: Gastroenterology

## 2020-06-25 ENCOUNTER — Ambulatory Visit: Payer: Medicare Other | Admitting: Gastroenterology

## 2020-06-25 VITALS — BP 134/56 | HR 58 | Ht 65.0 in | Wt 146.5 lb

## 2020-06-25 DIAGNOSIS — A0472 Enterocolitis due to Clostridium difficile, not specified as recurrent: Secondary | ICD-10-CM | POA: Diagnosis not present

## 2020-06-25 NOTE — Progress Notes (Signed)
    History of Present Illness: This is a 74 year old male returning for follow-up of C. difficile.  He is accompanied by his daughter.  He was initially prescribed vancomycin and after taking a few days of vancomycin he felt his diarrhea was worse as he discontinued it.  He then completed a 10-day course of Dificid and continues on a probiotic similar to [employer].  He has had substantial improvement in symptoms and is now having 4-5 semi formed stools per day which is a substantial improvement.  This morning he states he almost felt like he was going to be constipated and wondered about when to resume MiraLAX.  Current Medications, Allergies, Past Medical History, Past Surgical History, Family History and Social History were reviewed in [employer] record.   Physical Exam: General: Well developed, well nourished, elderly, frail, no acute distress, in a wheelchair  Head: Normocephalic and atraumatic Eyes:  sclerae anicteric, EOMI Ears: Normal auditory acuity Mouth: Not examined, mask on during Covid-19 pandemic Lungs: Clear throughout to auscultation Heart: Regular rate and rhythm; no murmurs, rubs or bruits Abdomen: Soft, non tender and non distended. No masses, hepatosplenomegaly or hernias noted. Normal Bowel sounds Rectal: Not done Musculoskeletal: Symmetrical with no gross deformities  Pulses:  Normal pulses noted Extremities: No clubbing, cyanosis, edema or deformities noted Neurological: Alert oriented x 4, grossly nonfocal Psychological:  Alert and cooperative. Normal mood and affect  Assessment and Recommendations:  1. C Diff.  Continue Florastor similar probiotic twice daily for 1 more month.  Recommended using Florastor or this current probiotic for any future courses of antibiotics and then continuing for 2-3 after weeks after completion of the antibiotic.   2.  Personal history of one small adenomatous colon polyp on colonoscopy in November 2017.  Due  to polyp surveillance guideline changes we will change this from a 5-year surveillance to a 7-year surveillance due in November 2024.

## 2020-06-25 NOTE — Patient Instructions (Signed)
Follow up as needed.   Thank you for choosing me and [facility].  Malcolm T. Stark, Jr., MD., FACG  

## 2020-07-17 ENCOUNTER — Other Ambulatory Visit: Payer: Self-pay | Admitting: Physician Assistant

## 2020-08-08 IMAGING — CT CT OF THE RIGHT FEMUR WITHOUT CONTRAST
1 of 6 series · 5 of 14 positions shown, 7 images · non-contrast
Comparison: Radiograph 01/31/2018

CLINICAL DATA: Follow-up femur fracture. History of fracture
fixation 01/31/2018.

EXAM:
CT OF THE LOWER RIGHT EXTREMITY WITHOUT CONTRAST
TECHNIQUE: Multidetector CT imaging of the right lower extremity was performed
according to the standard protocol.

[Series 9: lfov lower extremity 0.60 br40 s3 soft thin (perso · axial · 0.41mm/px · z∈[+1252,+1434]mm · 5 of 456 slices shown, 7 images]
[im 76/456  soft-tissue]
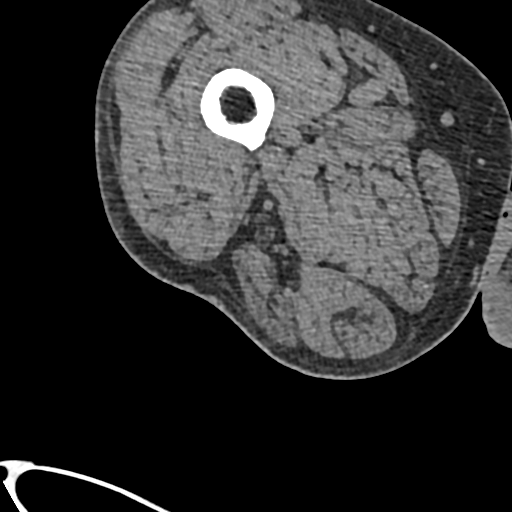
[im 76/456  bone]
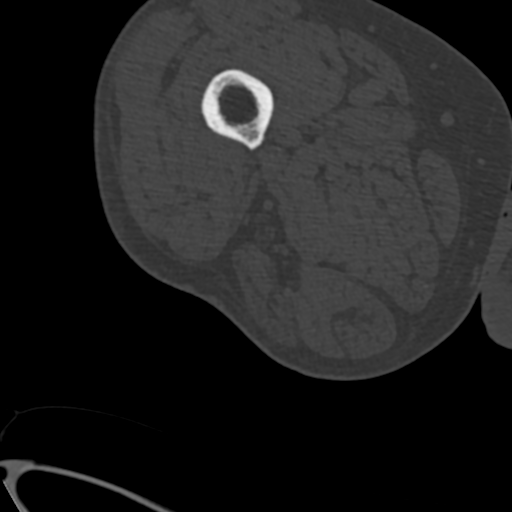
[im 152/456  bone]
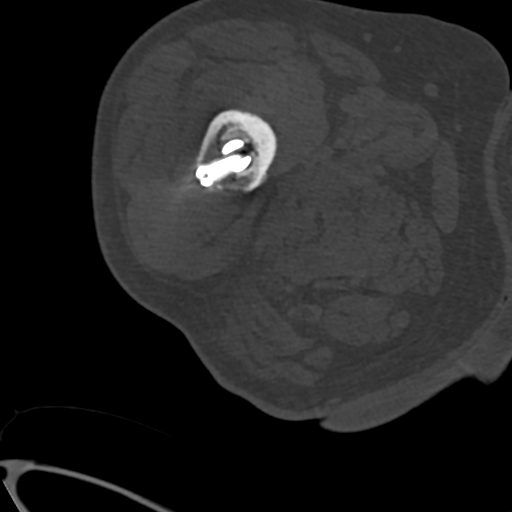
[im 228/456  bone]
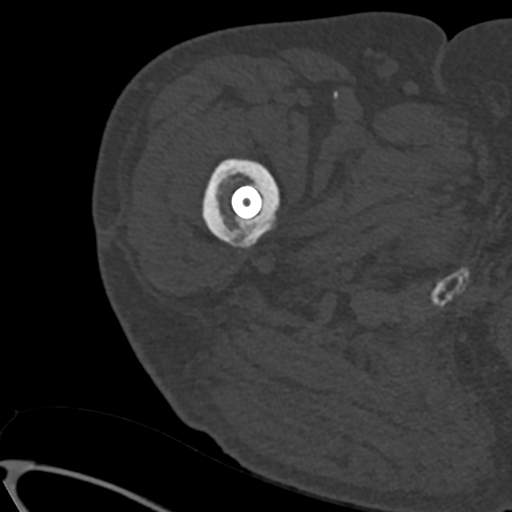
[im 304/456  bone]
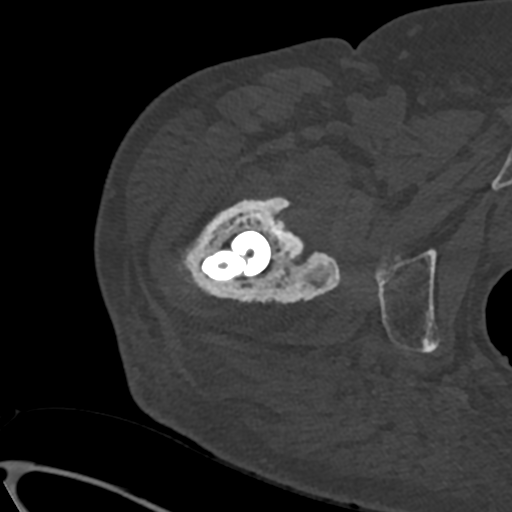
[im 380/456  soft-tissue]
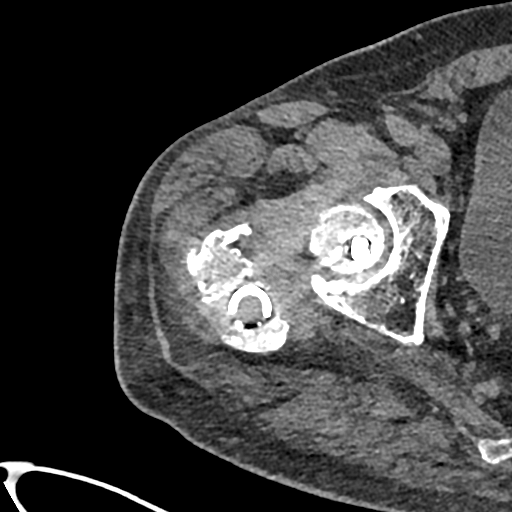
[im 380/456  bone]
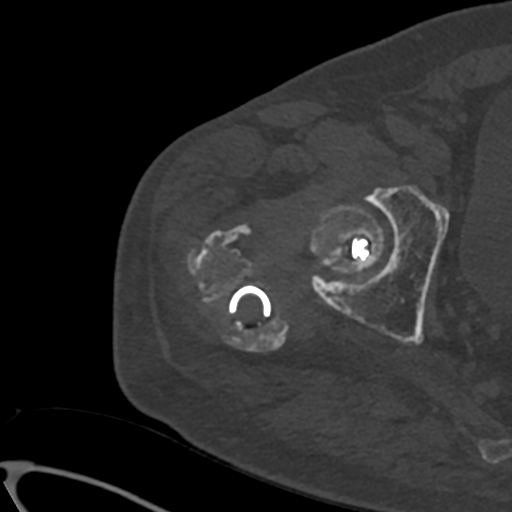

[5 of 14 positions shown; findings below may reference images not displayed]

FINDINGS: There is marked lucency around the upper half of the gamma nail
suggesting loosening. There is also significant lucency around the
dynamic hip screw. The screw is breaching the superior cortex of the
femoral head.

The gamma nail also appears cracked/fractured on either side of the
dynamic hip screw.

The distal interlocking screw is intact.

Largely ununited intertrochanteric fracture. There are few areas of
osseous bridging.

The hip is normally located. Moderate hip joint degenerative
changes. The visualized right hemipelvis is intact.
IMPRESSION: 1. Largely ununited intertrochanteric fracture of the right femur.
2. The gamma nail and dynamic hip screw are loose and the screw is
preaching cortex of the femoral head.
3. The gamma nails is also cracked on either side of the dynamic hip
screw.

## 2020-08-22 DEATH — deceased
